# Patient Record
Sex: Male | Born: 1970
Health system: Southern US, Community
[De-identification: ages and names within clinical notes are randomized; demographics above are authoritative.]

## PROBLEM LIST (undated history)

## (undated) DIAGNOSIS — M1990 Unspecified osteoarthritis, unspecified site: Secondary | ICD-10-CM

## (undated) DIAGNOSIS — C6290 Malignant neoplasm of unspecified testis, unspecified whether descended or undescended: Secondary | ICD-10-CM

## (undated) DIAGNOSIS — K219 Gastro-esophageal reflux disease without esophagitis: Secondary | ICD-10-CM

## (undated) DIAGNOSIS — R55 Syncope and collapse: Secondary | ICD-10-CM

## (undated) DIAGNOSIS — F909 Attention-deficit hyperactivity disorder, unspecified type: Secondary | ICD-10-CM

## (undated) DIAGNOSIS — R011 Cardiac murmur, unspecified: Secondary | ICD-10-CM

## (undated) DIAGNOSIS — I480 Paroxysmal atrial fibrillation: Secondary | ICD-10-CM

## (undated) DIAGNOSIS — Z95810 Presence of automatic (implantable) cardiac defibrillator: Secondary | ICD-10-CM

## (undated) DIAGNOSIS — I428 Other cardiomyopathies: Secondary | ICD-10-CM

## (undated) DIAGNOSIS — I472 Ventricular tachycardia, unspecified: Secondary | ICD-10-CM

## (undated) DIAGNOSIS — I5020 Unspecified systolic (congestive) heart failure: Secondary | ICD-10-CM

## (undated) DIAGNOSIS — F1010 Alcohol abuse, uncomplicated: Secondary | ICD-10-CM

## (undated) DIAGNOSIS — J189 Pneumonia, unspecified organism: Secondary | ICD-10-CM

## (undated) HISTORY — DX: Other cardiomyopathies: I42.8

## (undated) HISTORY — PX: WRIST FRACTURE SURGERY: SHX121

## (undated) HISTORY — DX: Gastro-esophageal reflux disease without esophagitis: K21.9

## (undated) HISTORY — DX: Syncope and collapse: R55

## (undated) HISTORY — DX: Ventricular tachycardia, unspecified: I47.20

## (undated) HISTORY — DX: Ventricular tachycardia: I47.2

## (undated) HISTORY — DX: Alcohol abuse, uncomplicated: F10.10

## (undated) HISTORY — DX: Unspecified osteoarthritis, unspecified site: M19.90

## (undated) HISTORY — DX: Malignant neoplasm of unspecified testis, unspecified whether descended or undescended: C62.90

## (undated) HISTORY — DX: Attention-deficit hyperactivity disorder, unspecified type: F90.9

## (undated) HISTORY — PX: FRACTURE SURGERY: SHX138

## (undated) HISTORY — DX: Unspecified systolic (congestive) heart failure: I50.20

---

## 1996-01-04 DIAGNOSIS — C6290 Malignant neoplasm of unspecified testis, unspecified whether descended or undescended: Secondary | ICD-10-CM

## 1996-01-04 HISTORY — PX: ORCHIECTOMY: SHX2116

## 1996-01-04 HISTORY — DX: Malignant neoplasm of unspecified testis, unspecified whether descended or undescended: C62.90

## 1997-05-02 ENCOUNTER — Ambulatory Visit (HOSPITAL_COMMUNITY): Admission: RE | Admit: 1997-05-02 | Discharge: 1997-05-02 | Payer: Self-pay | Admitting: Urology

## 1997-05-05 ENCOUNTER — Inpatient Hospital Stay (HOSPITAL_COMMUNITY): Admission: AD | Admit: 1997-05-05 | Discharge: 1997-05-10 | Payer: Self-pay | Admitting: Oncology

## 1997-05-20 ENCOUNTER — Inpatient Hospital Stay (HOSPITAL_COMMUNITY): Admission: EM | Admit: 1997-05-20 | Discharge: 1997-05-23 | Payer: Self-pay | Admitting: *Deleted

## 1997-05-28 ENCOUNTER — Inpatient Hospital Stay (HOSPITAL_COMMUNITY): Admission: EM | Admit: 1997-05-28 | Discharge: 1997-06-02 | Payer: Self-pay | Admitting: Oncology

## 1997-06-09 ENCOUNTER — Ambulatory Visit: Admission: RE | Admit: 1997-06-09 | Discharge: 1997-06-09 | Payer: Self-pay | Admitting: Oncology

## 1997-06-18 ENCOUNTER — Inpatient Hospital Stay (HOSPITAL_COMMUNITY): Admission: AD | Admit: 1997-06-18 | Discharge: 1997-06-23 | Payer: Self-pay | Admitting: Oncology

## 1997-07-09 ENCOUNTER — Inpatient Hospital Stay (HOSPITAL_COMMUNITY): Admission: RE | Admit: 1997-07-09 | Discharge: 1997-07-14 | Payer: Self-pay | Admitting: Oncology

## 1997-07-25 ENCOUNTER — Encounter: Admission: RE | Admit: 1997-07-25 | Discharge: 1997-10-23 | Payer: Self-pay | Admitting: Oncology

## 1998-10-31 ENCOUNTER — Encounter: Payer: Self-pay | Admitting: Emergency Medicine

## 1998-10-31 ENCOUNTER — Emergency Department (HOSPITAL_COMMUNITY): Admission: EM | Admit: 1998-10-31 | Discharge: 1998-10-31 | Payer: Self-pay | Admitting: Emergency Medicine

## 2000-04-19 ENCOUNTER — Encounter: Admission: RE | Admit: 2000-04-19 | Discharge: 2000-04-19 | Payer: Self-pay | Admitting: Urology

## 2000-04-19 ENCOUNTER — Encounter: Payer: Self-pay | Admitting: Urology

## 2009-10-27 ENCOUNTER — Ambulatory Visit: Payer: Self-pay | Admitting: Emergency Medicine

## 2009-10-27 DIAGNOSIS — J45909 Unspecified asthma, uncomplicated: Secondary | ICD-10-CM | POA: Insufficient documentation

## 2009-10-27 DIAGNOSIS — Z8547 Personal history of malignant neoplasm of testis: Secondary | ICD-10-CM | POA: Insufficient documentation

## 2009-11-05 ENCOUNTER — Ambulatory Visit: Payer: Self-pay | Admitting: Emergency Medicine

## 2009-12-02 ENCOUNTER — Encounter: Payer: Self-pay | Admitting: Emergency Medicine

## 2010-02-02 NOTE — Assessment & Plan Note (Signed)
Summary: asthma   Visit Type:  Follow-up  CC:  Asthma follow-up with PFT...patient says his breathing has improved slightly...not using his rescue inhaler as much.  History of Present Illness: 40 yo never smoker, hx testicular CA. Dx with asthma about 12 years ago at the time his CA was treated. PFT were done at [facility] at that time, but none since.   Was well, rarely needed SABA, until about 3 weeks ago after he was exposed to a fine dust. Since then he has had trouble w wheeze and chest tightness and SOB. Has awakened from sleep SOB. Has been using SABA more, sometimes 2 -3x a day, certainly every day. Has some signs consistent w OSA, periodic GERD but not often, does have allergies that may have coincided with some of his pulm symptoms. Never treated w Pred for exac.    ROV 11/05/09 -- f/u dyspnea, hx of asthma. Has been doing better, SABA use has decreased some but still using once daily. Started loratadine on his own 3 days ago, occas GERD symptoms.   Current Medications (verified): 1)  Adderall Xr 30 Mg Xr24h-Cap (Amphetamine-Dextroamphetamine) .Marland Kitchen.. 1 By Mouth Daily 2)  Proair Hfa 108 (90 Base) Mcg/act Aers (Albuterol Sulfate) .... 2 Puffs Every 4-6 Hours As Needed 3)  Glucosamine 1500 Complex  Caps (Glucosamine-Chondroit-Vit C-Mn) .Marland Kitchen.. 1 By Mouth Daily  Allergies (verified): 1)  ! * Ivp Dyes  Vital Signs:  Patient profile:   40 year old male Height:      77 inches (195.58 cm) Weight:      211 pounds (95.91 kg) BMI:     25.11 O2 Sat:      100 % on Room air Temp:     98.7 degrees F (37.06 degrees C) oral Pulse rate:   102 / minute BP sitting:   120 / 74  (right arm) Cuff size:   regular  Vitals Entered By: Michel Bickers CMA (November 05, 2009 1:56 PM)  O2 Sat at Rest %:  100 O2 Flow:  Room air CC: Asthma follow-up with PFT...patient says his breathing has improved slightly...not using his rescue inhaler as much Comments Medications reviewed with patient Michel Bickers CMA   November 05, 2009 1:56 PM   Physical Exam  General:  normal appearance and healthy appearing.   Head:  normocephalic and atraumatic Eyes:  conjunctiva and sclera clear Nose:  no deformity, discharge, inflammation, or lesions Mouth:  no deformity or lesions Neck:  no stridor Lungs:  clear bilaterally to auscultation and percussion. No wheeze on a forced exp Heart:  regular rate and rhythm, S1, S2 without murmurs, rubs, gallops, or clicks Abdomen:  not examined Extremities:  no edema Neurologic:  non-focal Psych:  alert and cooperative; normal mood and affect; normal attention span and concentration   Pulmonary Function Test Date: 11/05/2009 Height (in.): 77 Gender: Male  Pre-Spirometry FVC    Value: 6.01 L/min   Pred: 6.15 L/min     % Pred: 98 % FEV1    Value: 4.06 L     Pred: 4.54 L     % Pred: 89 % FEV1/FVC  Value: 68 %     Pred: 73 %    FEF 25-75  Value: 2.49 L/min   Pred: 4.33 L/min     % Pred: 57 %  Post-Spirometry FVC    Value: 6.07 L/min   Pred: 6.15 L/min     % Pred: 99 % FEV1    Value: 4.08 L  Pred: 4.54 L     % Pred: 90 % FEV1/FVC  Value: 67 %     Pred: 73 %    FEF 25-75  Value: 2.52 L/min   Pred: 4.33 L/min     % Pred: 58 %  Lung Volumes TLC    Value: 7.82 L   % Pred: 91 % RV    Value: 1.80 L   % Pred: 72 % DLCO    Value: 44.8 %   % Pred: 137 % DLCO/VA  Value: 5.75 %   % Pred: 136 %  Comments: Mild AFL. No BD response, normal volumes, high DLCO. RSB  Impression & Recommendations:  Problem # 1:  ASTHMA (ICD-493.90) Mild by PFT with a fixed component. had been intermottant in the past, ? transitioning into persistent. Would like to try controlling the exacerbating factors - GERD and allergies to see if the SABA use goes back down. If not then will start QVAR  Other Orders: Est. Patient Level IV (16109)  Patient Instructions: 1)  Continue your loratadine once daily  2)  If your are still using your inhaler more than 1x a week after a month, then please  start omeprazole 20mg  by mouth once daily (Prilosec) 3)  Call our office in 2 months to report your inhaler use at that time. We may decide to start another inhaled medication depending on your status 4)  Follow with Dr Delton Coombes in 6 months

## 2010-02-02 NOTE — Miscellaneous (Signed)
Summary: Orders Update pft charges  Clinical Lists Changes  Orders: Added new Service order of Carbon Monoxide diffusing w/capacity (94720) - Signed Added new Service order of Lung Volumes (94240) - Signed Added new Service order of Spirometry (Pre & Post) (94060) - Signed 

## 2010-02-02 NOTE — Assessment & Plan Note (Signed)
Summary: asthma   Visit Type:  Initial Consult  CC:  Pulmonary consult....  History of Present Illness: 40 yo never smoker, hx testicular CA. Dx with asthma about 12 years ago at the time his CA was treated. PFT were done at [facility] at that time, but none since.   Was well, rarely needed SABA, until about 3 weeks ago after he was exposed to a fine dust. Since then he has had trouble w wheeze and chest tightness and SOB. Has awakened from sleep SOB. Has been using SABA more, sometimes 2 -3x a day, certainly every day. Has some signs consistent w OSA, periodic GERD but not often, does have allergies that may have coincided with some of his pulm symptoms. Never treated w Pred for exac.    Preventive Screening-Counseling & Management  Alcohol-Tobacco     Alcohol drinks/day: 3     Alcohol type: beer     Smoking Status: never  Current Medications (verified): 1)  Adderall Xr 30 Mg Xr24h-Cap (Amphetamine-Dextroamphetamine) .Marland Kitchen.. 1 By Mouth Daily 2)  Proair Hfa 108 (90 Base) Mcg/act Aers (Albuterol Sulfate) .... 2 Puffs Every 4-6 Hours As Needed 3)  Glucosamine 1500 Complex  Caps (Glucosamine-Chondroit-Vit C-Mn) .Marland Kitchen.. 1 By Mouth Daily  Allergies (verified): 1)  ! * Ivp Dyes  Past History:  Past Medical History: Current Problems:  TESTICULAR CANCER (ICD-186.9) ASTHMA (ICD-493.90)  Family History: Pancreatic cancer---MGF Heart disease---mother  Social History: Patient never smoked.  Married Art therapist Smoking Status:  never Alcohol drinks/day:  3  Review of Systems       The patient complains of shortness of breath with activity, shortness of breath at rest, productive cough, non-productive cough, acid heartburn, and nasal congestion/difficulty breathing through nose.  The patient denies coughing up blood, chest pain, irregular heartbeats, indigestion, loss of appetite, weight change, abdominal pain, difficulty swallowing, sore throat, tooth/dental problems,  headaches, sneezing, itching, ear ache, anxiety, depression, hand/feet swelling, joint stiffness or pain, rash, change in color of mucus, and fever.    Vital Signs:  Patient profile:   40 year old male Height:      77 inches (195.58 cm) Weight:      210 pounds (95.45 kg) BMI:     24.99 O2 Sat:      97 % on Room air Temp:     98.2 degrees F (36.78 degrees C) oral Pulse rate:   90 / minute BP sitting:   164 / 106  (right arm) Cuff size:   regular  Vitals Entered By: Michel Bickers CMA (October 27, 2009 3:23 PM)  O2 Sat at Rest %:  97 O2 Flow:  Room air CC: Pulmonary consult... Is Patient Diabetic? No Comments Medications reviewed with patient Daytime phone verified. Michel Bickers CMA  October 27, 2009 3:24 PM   Physical Exam  General:  normal appearance and healthy appearing.   Head:  normocephalic and atraumatic Eyes:  conjunctiva and sclera clear Nose:  no deformity, discharge, inflammation, or lesions Mouth:  no deformity or lesions Neck:  no stridor Lungs:  clear bilaterally to auscultation and percussion. No wheeze on a forced exp Heart:  regular rate and rhythm, S1, S2 without murmurs, rubs, gallops, or clicks Abdomen:  not examined Extremities:  no edema Neurologic:  non-focal Psych:  alert and cooperative; normal mood and affect; normal attention span and concentration   Impression & Recommendations:  Problem # 1:  ASTHMA (ICD-493.90) Suspect this is asthma, possibly being driven by mild GERD and allergies.  May also be affected by suspected OSA.  - full PFT - as needed SABA for now - address GERD or allergies if we believe that these are influencing   Problem # 2:  ? of SLEEP APNEA (ICD-780.57)  - consider PSG in the future  Orders: Consultation Level IV (16109)  Medications Added to Medication List This Visit: 1)  Adderall Xr 30 Mg Xr24h-cap (Amphetamine-dextroamphetamine) .Marland Kitchen.. 1 by mouth daily 2)  Proair Hfa 108 (90 Base) Mcg/act Aers (Albuterol sulfate)  .... 2 puffs every 4-6 hours as needed 3)  Glucosamine 1500 Complex Caps (Glucosamine-chondroit-vit c-mn) .Marland Kitchen.. 1 by mouth daily  Patient Instructions: 1)  We will arrange for pulmonary function testing as soon as possible.  2)  Follow up with Dr Delton Coombes when your PFts are done to review 3)  Use Ventolin 2 puffs up to every 4 hours if needed for shortness of breath.   Appended Document: Orders Update    Clinical Lists Changes  Orders: Added new Referral order of Pulmonary Referral (Pulmonary) - Signed

## 2010-02-05 ENCOUNTER — Emergency Department (HOSPITAL_COMMUNITY): Payer: 59

## 2010-02-05 ENCOUNTER — Encounter: Payer: Self-pay | Admitting: Internal Medicine

## 2010-02-05 ENCOUNTER — Observation Stay (HOSPITAL_COMMUNITY)
Admission: EM | Admit: 2010-02-05 | Discharge: 2010-02-06 | Disposition: A | Discharge status: Home / Self Care | Payer: 59 | Attending: Internal Medicine | Admitting: Internal Medicine

## 2010-02-05 ENCOUNTER — Encounter (HOSPITAL_COMMUNITY): Payer: Self-pay | Admitting: Radiology

## 2010-02-05 DIAGNOSIS — F1011 Alcohol abuse, in remission: Secondary | ICD-10-CM | POA: Insufficient documentation

## 2010-02-05 DIAGNOSIS — M47812 Spondylosis without myelopathy or radiculopathy, cervical region: Secondary | ICD-10-CM | POA: Insufficient documentation

## 2010-02-05 DIAGNOSIS — Z7982 Long term (current) use of aspirin: Secondary | ICD-10-CM | POA: Insufficient documentation

## 2010-02-05 DIAGNOSIS — Z8547 Personal history of malignant neoplasm of testis: Secondary | ICD-10-CM | POA: Insufficient documentation

## 2010-02-05 DIAGNOSIS — J329 Chronic sinusitis, unspecified: Secondary | ICD-10-CM | POA: Insufficient documentation

## 2010-02-05 DIAGNOSIS — F909 Attention-deficit hyperactivity disorder, unspecified type: Secondary | ICD-10-CM | POA: Insufficient documentation

## 2010-02-05 DIAGNOSIS — R55 Syncope and collapse: Principal | ICD-10-CM | POA: Insufficient documentation

## 2010-02-05 DIAGNOSIS — I509 Heart failure, unspecified: Secondary | ICD-10-CM | POA: Insufficient documentation

## 2010-02-05 DIAGNOSIS — I379 Nonrheumatic pulmonary valve disorder, unspecified: Secondary | ICD-10-CM | POA: Insufficient documentation

## 2010-02-05 DIAGNOSIS — Z79899 Other long term (current) drug therapy: Secondary | ICD-10-CM | POA: Insufficient documentation

## 2010-02-05 DIAGNOSIS — R220 Localized swelling, mass and lump, head: Secondary | ICD-10-CM | POA: Insufficient documentation

## 2010-02-05 DIAGNOSIS — I5020 Unspecified systolic (congestive) heart failure: Secondary | ICD-10-CM | POA: Insufficient documentation

## 2010-02-05 DIAGNOSIS — R9431 Abnormal electrocardiogram [ECG] [EKG]: Secondary | ICD-10-CM | POA: Insufficient documentation

## 2010-02-05 LAB — URINALYSIS, ROUTINE W REFLEX MICROSCOPIC
Bilirubin Urine: NEGATIVE
Hgb urine dipstick: NEGATIVE
Ketones, ur: NEGATIVE mg/dL
Nitrite: NEGATIVE
Protein, ur: NEGATIVE mg/dL
Specific Gravity, Urine: 1.01 (ref 1.005–1.030)
Urine Glucose, Fasting: NEGATIVE mg/dL
Urobilinogen, UA: 0.2 mg/dL (ref 0.0–1.0)
pH: 7 (ref 5.0–8.0)

## 2010-02-05 LAB — TROPONIN I: Troponin I: 0.01 ng/mL (ref 0.00–0.06)

## 2010-02-05 LAB — D-DIMER, QUANTITATIVE: D-Dimer, Quant: 0.22 ug/mL-FEU (ref 0.00–0.48)

## 2010-02-05 LAB — DIFFERENTIAL
Basophils Absolute: 0 10*3/uL (ref 0.0–0.1)
Basophils Relative: 0 % (ref 0–1)
Eosinophils Absolute: 0.2 10*3/uL (ref 0.0–0.7)
Eosinophils Relative: 3 % (ref 0–5)
Lymphocytes Relative: 12 % (ref 12–46)
Lymphs Abs: 1.1 10*3/uL (ref 0.7–4.0)
Monocytes Absolute: 0.5 10*3/uL (ref 0.1–1.0)
Monocytes Relative: 6 % (ref 3–12)
Neutro Abs: 6.6 10*3/uL (ref 1.7–7.7)
Neutrophils Relative %: 79 % — ABNORMAL HIGH (ref 43–77)

## 2010-02-05 LAB — CBC
HCT: 45.5 % (ref 39.0–52.0)
Hemoglobin: 16.1 g/dL (ref 13.0–17.0)
MCH: 33.1 pg (ref 26.0–34.0)
MCHC: 35.4 g/dL (ref 30.0–36.0)
MCV: 93.4 fL (ref 78.0–100.0)
Platelets: 182 10*3/uL (ref 150–400)
RBC: 4.87 MIL/uL (ref 4.22–5.81)
RDW: 12.5 % (ref 11.5–15.5)
WBC: 8.5 10*3/uL (ref 4.0–10.5)

## 2010-02-05 LAB — PHOSPHORUS: Phosphorus: 3.1 mg/dL (ref 2.3–4.6)

## 2010-02-05 LAB — BASIC METABOLIC PANEL
BUN: 12 mg/dL (ref 6–23)
CO2: 26 mEq/L (ref 19–32)
Calcium: 9.3 mg/dL (ref 8.4–10.5)
Chloride: 101 mEq/L (ref 96–112)
Creatinine, Ser: 1.16 mg/dL (ref 0.4–1.5)
GFR calc Af Amer: >60 mL/min (ref >60)
GFR calc non Af Amer: >60 mL/min (ref >60)
Glucose, Bld: 100 mg/dL — ABNORMAL HIGH (ref 70–99)
Potassium: 3.7 mEq/L (ref 3.5–5.1)
Sodium: 137 mEq/L (ref 135–145)

## 2010-02-05 LAB — CK TOTAL AND CKMB (NOT AT ARMC)
CK, MB: 1.9 ng/mL (ref 0.3–4.0)
Relative Index: 1.3 (ref 0.0–2.5)
Total CK: 145 U/L (ref 7–232)

## 2010-02-05 LAB — POCT CARDIAC MARKERS
CKMB, poc: 1.7 ng/mL (ref 1.0–8.0)
Myoglobin, poc: 94.3 ng/mL (ref 12–200)
Troponin i, poc: <0.05 ng/mL (ref 0.00–0.09)

## 2010-02-05 LAB — ETHANOL: Alcohol, Ethyl (B): <5 mg/dL (ref 0–10)

## 2010-02-05 LAB — TSH: TSH: 2.486 u[IU]/mL (ref 0.350–4.500)

## 2010-02-05 LAB — HEMOGLOBIN A1C
Hgb A1c MFr Bld: 5.4 % (ref <5.7)
Mean Plasma Glucose: 108 mg/dL (ref <117)

## 2010-02-05 LAB — RAPID URINE DRUG SCREEN, HOSP PERFORMED
Amphetamines: POSITIVE — AB
Barbiturates: NOT DETECTED
Benzodiazepines: NOT DETECTED
Cocaine: NOT DETECTED
Opiates: NOT DETECTED
Tetrahydrocannabinol: NOT DETECTED

## 2010-02-05 LAB — MAGNESIUM: Magnesium: 2.2 mg/dL (ref 1.5–2.5)

## 2010-02-06 DIAGNOSIS — R55 Syncope and collapse: Secondary | ICD-10-CM

## 2010-02-06 LAB — LIPID PANEL
Cholesterol: 167 mg/dL (ref 0–200)
HDL: 59 mg/dL (ref >39)
LDL Cholesterol: 89 mg/dL (ref 0–99)
Total CHOL/HDL Ratio: 2.8 RATIO
Triglycerides: 96 mg/dL (ref <150)
VLDL: 19 mg/dL (ref 0–40)

## 2010-02-06 LAB — CBC
HCT: 44.3 % (ref 39.0–52.0)
Hemoglobin: 15.4 g/dL (ref 13.0–17.0)
MCH: 32.6 pg (ref 26.0–34.0)
MCHC: 34.8 g/dL (ref 30.0–36.0)
MCV: 93.7 fL (ref 78.0–100.0)
Platelets: 161 10*3/uL (ref 150–400)
RBC: 4.73 MIL/uL (ref 4.22–5.81)
RDW: 12.5 % (ref 11.5–15.5)
WBC: 7.4 10*3/uL (ref 4.0–10.5)

## 2010-02-06 LAB — CARDIAC PANEL(CRET KIN+CKTOT+MB+TROPI)
CK, MB: 1.6 ng/mL (ref 0.3–4.0)
CK, MB: 1.7 ng/mL (ref 0.3–4.0)
Relative Index: 1.2 (ref 0.0–2.5)
Relative Index: 1.4 (ref 0.0–2.5)
Total CK: 121 U/L (ref 7–232)
Total CK: 137 U/L (ref 7–232)
Troponin I: 0.01 ng/mL (ref 0.00–0.06)
Troponin I: 0.01 ng/mL (ref 0.00–0.06)

## 2010-02-08 ENCOUNTER — Encounter: Payer: Self-pay | Admitting: Internal Medicine

## 2010-02-08 ENCOUNTER — Inpatient Hospital Stay (HOSPITAL_COMMUNITY)
Admission: AD | Admit: 2010-02-08 | Discharge: 2010-02-11 | DRG: 225 | Disposition: A | Discharge status: Home Health | Payer: 59 | Source: Ambulatory Visit | Attending: Internal Medicine | Admitting: Internal Medicine

## 2010-02-08 ENCOUNTER — Encounter (INDEPENDENT_AMBULATORY_CARE_PROVIDER_SITE_OTHER): Payer: Commercial Managed Care - PPO | Admitting: Internal Medicine

## 2010-02-08 DIAGNOSIS — R55 Syncope and collapse: Secondary | ICD-10-CM | POA: Insufficient documentation

## 2010-02-08 DIAGNOSIS — F909 Attention-deficit hyperactivity disorder, unspecified type: Secondary | ICD-10-CM | POA: Diagnosis present

## 2010-02-08 DIAGNOSIS — I428 Other cardiomyopathies: Secondary | ICD-10-CM | POA: Diagnosis present

## 2010-02-08 DIAGNOSIS — Z8547 Personal history of malignant neoplasm of testis: Secondary | ICD-10-CM

## 2010-02-08 DIAGNOSIS — I119 Hypertensive heart disease without heart failure: Secondary | ICD-10-CM | POA: Insufficient documentation

## 2010-02-08 DIAGNOSIS — J45909 Unspecified asthma, uncomplicated: Secondary | ICD-10-CM | POA: Diagnosis present

## 2010-02-08 DIAGNOSIS — I10 Essential (primary) hypertension: Secondary | ICD-10-CM | POA: Diagnosis present

## 2010-02-08 DIAGNOSIS — I429 Cardiomyopathy, unspecified: Secondary | ICD-10-CM | POA: Insufficient documentation

## 2010-02-08 DIAGNOSIS — R0989 Other specified symptoms and signs involving the circulatory and respiratory systems: Secondary | ICD-10-CM

## 2010-02-08 LAB — CBC
HCT: 44.4 % (ref 39.0–52.0)
Hemoglobin: 15.5 g/dL (ref 13.0–17.0)
MCH: 32.7 pg (ref 26.0–34.0)
MCHC: 34.9 g/dL (ref 30.0–36.0)
MCV: 93.7 fL (ref 78.0–100.0)
Platelets: 149 10*3/uL — ABNORMAL LOW (ref 150–400)
RBC: 4.74 MIL/uL (ref 4.22–5.81)
RDW: 12.6 % (ref 11.5–15.5)
WBC: 7.1 10*3/uL (ref 4.0–10.5)

## 2010-02-08 LAB — BASIC METABOLIC PANEL
BUN: 14 mg/dL (ref 6–23)
CO2: 25 mEq/L (ref 19–32)
Calcium: 9.2 mg/dL (ref 8.4–10.5)
Chloride: 103 mEq/L (ref 96–112)
Creatinine, Ser: 1.22 mg/dL (ref 0.4–1.5)
GFR calc Af Amer: >60 mL/min (ref >60)
GFR calc non Af Amer: >60 mL/min (ref >60)
Glucose, Bld: 104 mg/dL — ABNORMAL HIGH (ref 70–99)
Potassium: 3.5 mEq/L (ref 3.5–5.1)
Sodium: 139 mEq/L (ref 135–145)

## 2010-02-08 LAB — PROTIME-INR
INR: 0.93 (ref 0.00–1.49)
Prothrombin Time: 12.7 seconds (ref 11.6–15.2)

## 2010-02-09 DIAGNOSIS — I428 Other cardiomyopathies: Secondary | ICD-10-CM

## 2010-02-10 ENCOUNTER — Ambulatory Visit: Payer: No Typology Code available for payment source | Admitting: Internal Medicine

## 2010-02-10 ENCOUNTER — Inpatient Hospital Stay (HOSPITAL_COMMUNITY): Payer: 59

## 2010-02-10 MED ORDER — GADOPENTETATE DIMEGLUMINE 469.01 MG/ML IV SOLN: 45.0000 mL | Freq: Once | INTRAVENOUS | Status: DC

## 2010-02-10 MED ORDER — GADOPENTETATE DIMEGLUMINE 469.01 MG/ML IV SOLN
45.0000 mL | Freq: Once | INTRAVENOUS | Status: AC
  Administered 2010-02-10: 13:00:00 45 mL via INTRAVENOUS

## 2010-02-11 ENCOUNTER — Inpatient Hospital Stay (HOSPITAL_COMMUNITY): Payer: 59
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
## 2010-02-15 ENCOUNTER — Telehealth: Payer: Self-pay | Admitting: Cardiology
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
## 2010-02-17 ENCOUNTER — Encounter: Payer: Self-pay | Admitting: Internal Medicine

## 2010-02-17 ENCOUNTER — Encounter (INDEPENDENT_AMBULATORY_CARE_PROVIDER_SITE_OTHER): Payer: 59

## 2010-02-17 DIAGNOSIS — I428 Other cardiomyopathies: Secondary | ICD-10-CM

## 2010-02-17 NOTE — Procedures (Signed)
  NAMEJEQUAN, SHAHIN NO.:  [id number]  MEDICAL RECORD NO.:  [id number]           PATIENT TYPE:  I  LOCATION:  6524                         FACILITY:  [facility]  PHYSICIAN:  Mairany Bruno M. [place], M.D.  DATE OF BIRTH:  02/04/1970  DATE OF PROCEDURE:  02/09/2010 DATE OF DISCHARGE:                           CARDIAC CATHETERIZATION   INDICATIONS FOR PROCEDURE:  A 40 year old white male with recent episode of syncope.  Echocardiogram demonstrates severe cardiomyopathy.  PROCEDURE:  Left heart catheterization, coronary and left ventricular angiography.  ACCESS:  Via the right radial artery using standard Seldinger technique.  EQUIPMENT:  5-French 4-cm right Judkins catheter, 5-French 3.5-cm left Judkins catheter, 5-French pigtail catheter, and 5-French arterial sheath.  MEDICATIONS:  Local anesthesia 1% Xylocaine, Versed 2 mg IV, fentanyl 25 mcg IV, verapamil 3 mg intra-arterial, heparin 4000 units IV.  CONTRAST:  90 mL of Omnipaque.  HEMODYNAMIC DATA:  Aortic pressure is 127/88 with a mean of 105.  Left ventricle pressure is 129 with an EDP of 10 mmHg.  ANGIOGRAPHIC DATA:  The left coronary artery arises and distributes in a codominant fashion.  The left main coronary artery is normal.  The left anterior descending artery is normal.  The left circumflex coronary is very large and is normal.  The right coronary artery gives rise to the PDA.  It is normal.  The left ventricular angiography performed in the RAO view demonstrates normal left ventricular size with global systolic dysfunction and ejection fraction estimated at 30-35%.  FINAL INTERPRETATION: 1. Normal coronary anatomy. 2. Severe left ventricular dysfunction.          ______________________________ Reyan Helle M. [place], M.D.     PMJ/MEDQ  D:  02/09/2010  T:  02/10/2010  Job:  [id number]  cc:   Duke Salvia, MD, [facility] Luis Abed, MD, [facility]  Electronically Signed by Adan Baehr [place] M.D. on  02/17/2010 11:17:21 AM
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
## 2010-02-23 ENCOUNTER — Encounter: Payer: Self-pay | Admitting: Cardiology

## 2010-02-24 ENCOUNTER — Encounter: Payer: Self-pay | Admitting: Cardiology

## 2010-02-24 NOTE — Miscellaneous (Signed)
Summary: Device preload  Clinical Lists Changes  Observations: Added new observation of ICD INDICATN: NICM (02/17/2010 7:23) Added new observation of ICDLEADSTAT1: active (02/17/2010 7:23) Added new observation of ICDLEADSER1: 161096  (02/17/2010 7:23) Added new observation of ICDLEADMOD1: 0185  (02/17/2010 7:23) Added new observation of ICDLEADLOC1: RV  (02/17/2010 7:23) Added new observation of ICD IMP MD: Lewayne Bunting, MD  (02/17/2010 7:23) Added new observation of ICDLEADDOI1: 02/10/2010  (02/17/2010 7:23) Added new observation of ICD IMPL DTE: 02/10/2010  (02/17/2010 7:23) Added new observation of ICD SERL#: [id number] H  (02/17/2010 7:23) Added new observation of ICD MODL#: [id number]  (02/17/2010 7:23) Added new observation of ICDMANUFACTR: Medtronic  (02/17/2010 7:23) Added new observation of ICD MD: Lewayne Bunting, MD  (02/17/2010 7:23)       ICD Specifications Following MD:  Lewayne Bunting, MD     ICD Vendor:  Medtronic     ICD Model Number:  D314VRG     ICD Serial Number:  [id number] ICD DOI:  02/10/2010     ICD Implanting MD:  Lewayne Bunting, MD  Lead 1:    Location: RV     DOI: 02/10/2010     Model #: [id number]     Serial #: [id number]     Status: active  Indications::  NICM

## 2010-02-24 NOTE — Progress Notes (Addendum)
Summary: pt having side effect from med    to discuss with JK  Phone Note Call from Patient   Caller: Patient [phone] Reason for Call: Talk to Doctor Summary of Call: pt calling re side effect of med Initial call taken by: Glynda Jaeger,  February 15, 2010 10:42 AM  Follow-up for Phone Call        Left message to call back Meredith Staggers, RN  February 15, 2010 11:30 AM   pt is having intermintent numbness in hands and feet, feels it could be from coreg, will discuss w/Dr Myrtis Ser and call pt back Meredith Staggers, RN  February 15, 2010 5:13 PM   pt's wife calling back re question on med-and had another syncopal episode today-pls call -pls call [phone] stacy Glynda Jaeger  February 16, 2010 3:35 PM   Additional Follow-up for Phone Call Additional follow up Details #1::        spoke w/pts wife earlier she reports pt was standing in hallway earlier and began very dizzy had to lay down, he did not passout completely but felt like he did in the car before going to hospital, she reports he was very diaphoretic, discussed w/Dr Bensimhon bring pt in for ICD interrogation tom., appt sch for 2:30 pts wife is aware, she will begin checking pts BP Meredith Staggers, RN  February 16, 2010 5:52 PM     Additional Follow-up for Phone Call Additional follow up Details #2::    pt had device interogatted and Dr Ladona Ridgel spoke w/pt and his wife and d/c ramipril due to pt starting to have a cough, see device note for details Meredith Staggers, RN  February 17, 2010 4:07 PM
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
## 2010-03-01 ENCOUNTER — Ambulatory Visit: Payer: Commercial Managed Care - PPO

## 2010-03-02 NOTE — Procedures (Signed)
Summary: Cardiology Device Clinic   Current Medications (verified): 1)  Adderall Xr 30 Mg Xr24h-Cap (Amphetamine-Dextroamphetamine) .Marland Kitchen.. 1 By Mouth Daily 2)  Proair Hfa 108 (90 Base) Mcg/act Aers (Albuterol Sulfate) .... 2 Puffs Every 4-6 Hours As Needed 3)  Glucosamine 1500 Complex  Caps (Glucosamine-Chondroit-Vit C-Mn) .Marland Kitchen.. 1 By Mouth Daily 4)  Ramipril 2.5 Mg Caps (Ramipril) .... Take One Capsule Once Daily 5)  Aspirin 81 Mg Tabs (Aspirin) .... Take One Tablet Once Daily 6)  Carvedilol 6.25 Mg Tabs (Carvedilol) .... One By Mouth Two Times A Day  Allergies (verified): 1)  ! * Ivp Dyes   ICD Specifications Following MD:  Lewayne Bunting, MD     ICD Vendor:  Medtronic     ICD Model Number:  D314VRG     ICD Serial Number:  [id number] ICD DOI:  02/10/2010     ICD Implanting MD:  Lewayne Bunting, MD  Lead 1:    Location: RV     DOI: 02/10/2010     Model #: 4098     Serial #: [id number]     Status: active  Indications::  NICM  Tech Comments:  See Arita Miss Art

## 2010-03-02 NOTE — Miscellaneous (Signed)
  Clinical Lists Changes  Observations: Added new observation of CARDIO HPI: I called patient to let hime know that I have asked Dr.Bensimhon to be his primary cardiologist. He understands.  He his stopped all alcohol as I had advised him. He has requested xanax to help for a short period. We will arrange for xanax for a short period. (02/24/2010 15:53) Added new observation of VISIT TYPE: Telephone Call (02/24/2010 15:53)      Visit Type:  Telephone Call   History of Present Illness: I called patient to let hime know that I have asked Dr.Bensimhon to be his primary cardiologist. He understands.  He his stopped all alcohol as I had advised him. He has requested xanax to help for a short period. We will arrange for xanax for a short period.  Appended Document:  [name],   Please arrange an office visit for patient to see Dan in 2 week range. Please also call in xanax 0.25mg  , one as needed for current stress.  Give 20 tabs with one refill.  Appended Document:  He uses [facility].  Appended Document:  rx telephoned into pharmacy, appt sch w/Dr Bensimhon for 3/5 at 4:30, pts wife is aware    Clinical Lists Changes  Medications: Added new medication of ALPRAZOLAM 0.25 MG TABS (ALPRAZOLAM) once daily as needed - Signed Rx of ALPRAZOLAM 0.25 MG TABS (ALPRAZOLAM) once daily as needed;  #20 x 1;  Signed;  Entered by: Meredith Staggers, RN;  Authorized by: Talitha Givens, MD, [facility];  Method used: Printed then faxed to [facility] [address], [address] Shipping/mailing, [place], [place]  [address], Ph: [phone], Fax: [phone]    Prescriptions: ALPRAZOLAM 0.25 MG TABS (ALPRAZOLAM) once daily as needed  #20 x 1   Entered by:   Meredith Staggers, RN   Authorized by:   Talitha Givens, MD, [facility]   Signed by:   Meredith Staggers, RN on 02/24/2010   Method used:   Printed then faxed to ...       [facility]* (retail)  [address]       [address] Shipping/mailing       [place], [place]  [address]       Ph: [phone]       Fax: [phone]   RxID:   [id number]

## 2010-03-02 NOTE — Cardiovascular Report (Signed)
Summary: Office Visit   Office Visit   Imported By: Roderic Ovens 02/24/2010 15:51:34  _____________________________________________________________________  External Attachment:    Type:   Image     Comment:   External Document

## 2010-03-08 ENCOUNTER — Encounter (INDEPENDENT_AMBULATORY_CARE_PROVIDER_SITE_OTHER): Payer: 59 | Admitting: Internal Medicine

## 2010-03-08 ENCOUNTER — Encounter: Payer: Self-pay | Admitting: Internal Medicine

## 2010-03-08 DIAGNOSIS — I428 Other cardiomyopathies: Secondary | ICD-10-CM

## 2010-03-09 ENCOUNTER — Encounter: Payer: Self-pay | Admitting: Internal Medicine

## 2010-03-10 ENCOUNTER — Other Ambulatory Visit: Payer: 59

## 2010-03-11 ENCOUNTER — Encounter (INDEPENDENT_AMBULATORY_CARE_PROVIDER_SITE_OTHER): Payer: Self-pay | Admitting: *Deleted

## 2010-03-11 NOTE — Letter (Signed)
Summary: [name]   Imported By: Marylou Mccoy 03/05/2010 11:24:47  _____________________________________________________________________  External Attachment:    Type:   Image     Comment:   External Document
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
## 2010-03-16 NOTE — Letter (Signed)
Summary: Device-Delinquent Check  [facility], Main Office  [address]   [place], [place] [address]   Phone: [phone]  Fax: [phone]     March 11, 2010 MRN: [id number]   [name] [address] [place], [place]  [address]   Dear [name],  According to our records, you have not had your implanted device checked in the recommended period of time.  We are unable to determine appropriate device function without checking your device on a regular basis.  Please call our office to schedule an appointment as soon as possible.  If you are having your device checked by another physician, please call us so that we may update our records.  Thank you,  Letta Moynahan, EMT  March 11, 2010 2:02 PM  [facility]

## 2010-03-17 ENCOUNTER — Encounter (HOSPITAL_COMMUNITY): Payer: 59

## 2010-03-22 ENCOUNTER — Encounter: Payer: Commercial Managed Care - PPO | Admitting: Internal Medicine

## 2010-03-23 NOTE — Procedures (Signed)
Summary: had presyncopal episode yest, per bensimhon have interogatted...   Allergies: 1)  ! * Ivp Dyes   ICD Specifications Following MD:  Lewayne Bunting, MD     ICD Vendor:  [employer]     ICD Model Number:  D314VRG     ICD Serial Number:  [id number] ICD DOI:  02/10/2010     ICD Implanting MD:  Lewayne Bunting, MD  Lead 1:    Location: RV     DOI: 02/10/2010     Model #: [id number]     Serial #: [id number]     Status: active  Indications::  NICM  Tech Comments:  see paceart report.

## 2010-04-23 ENCOUNTER — Telehealth: Payer: Self-pay | Admitting: Emergency Medicine

## 2010-04-23 MED ORDER — ALBUTEROL SULFATE HFA 108 (90 BASE) MCG/ACT IN AERS: INHALATION_SPRAY | RESPIRATORY_TRACT | Status: DC

## 2010-04-23 NOTE — Telephone Encounter (Signed)
Left detailed message rx was sent to pharmacy

## 2010-04-27 ENCOUNTER — Encounter: Payer: 59 | Admitting: Internal Medicine

## 2010-04-29 ENCOUNTER — Encounter: Payer: Self-pay | Admitting: Internal Medicine

## 2010-07-09 ENCOUNTER — Encounter: Payer: Self-pay | Admitting: Cardiology

## 2010-08-17 ENCOUNTER — Telehealth: Payer: Self-pay | Admitting: Internal Medicine

## 2010-08-17 NOTE — Telephone Encounter (Signed)
Pt's wife calling husband having flu like symptoms--severe HA,back spasm --advised to go to urgent care or f/u with PCP--WIFE AGREES--NT

## 2010-08-17 NOTE — Telephone Encounter (Signed)
Per pt wife call, pt has been having flu like symptoms. First night pt was feeling lightheaded and goofy. The next morning pt had a splitting headache. Now pt is having back spasms, seeing stars, and feeling dizzy. Symptoms have been going on for the past three days. Pt is a little congested, with mostly head congestion. Pt wife is concerned. Please return pt call to advise/discuss.

## 2010-08-26 ENCOUNTER — Inpatient Hospital Stay (INDEPENDENT_AMBULATORY_CARE_PROVIDER_SITE_OTHER)
Admission: RE | Admit: 2010-08-26 | Discharge: 2010-08-26 | Disposition: A | Discharge status: Home / Self Care | Payer: 59 | Source: Ambulatory Visit | Attending: Family Medicine | Admitting: Family Medicine

## 2010-08-26 DIAGNOSIS — G542 Cervical root disorders, not elsewhere classified: Secondary | ICD-10-CM

## 2010-08-26 DIAGNOSIS — M542 Cervicalgia: Secondary | ICD-10-CM

## 2010-09-07 ENCOUNTER — Other Ambulatory Visit: Payer: Self-pay | Admitting: Emergency Medicine

## 2010-09-07 DIAGNOSIS — M5412 Radiculopathy, cervical region: Secondary | ICD-10-CM

## 2010-09-16 ENCOUNTER — Other Ambulatory Visit: Payer: 59

## 2010-09-16 ENCOUNTER — Ambulatory Visit
Admission: RE | Admit: 2010-09-16 | Discharge: 2010-09-16 | Disposition: A | Discharge status: Home / Self Care | Payer: 59 | Source: Ambulatory Visit | Attending: Emergency Medicine | Admitting: Emergency Medicine

## 2010-09-16 DIAGNOSIS — M5412 Radiculopathy, cervical region: Secondary | ICD-10-CM

## 2010-09-21 ENCOUNTER — Telehealth: Payer: Self-pay | Admitting: Emergency Medicine

## 2010-09-21 MED ORDER — ALBUTEROL SULFATE HFA 108 (90 BASE) MCG/ACT IN AERS: 2.0000 | INHALATION_SPRAY | Freq: Four times a day (QID) | RESPIRATORY_TRACT | Status: DC | PRN

## 2010-09-21 NOTE — Telephone Encounter (Signed)
Spoke with Lori-okay to change Rx's according to insurance coverage. Pt is aware of change and sent to [facility] as requested.

## 2010-10-11 ENCOUNTER — Ambulatory Visit: Payer: 59 | Admitting: Emergency Medicine

## 2010-11-15 ENCOUNTER — Ambulatory Visit: Payer: 59 | Admitting: Emergency Medicine

## 2011-01-22 ENCOUNTER — Ambulatory Visit (INDEPENDENT_AMBULATORY_CARE_PROVIDER_SITE_OTHER): Payer: 59

## 2011-01-22 DIAGNOSIS — M533 Sacrococcygeal disorders, not elsewhere classified: Secondary | ICD-10-CM

## 2011-01-26 ENCOUNTER — Telehealth: Payer: Self-pay | Admitting: Internal Medicine

## 2011-01-26 NOTE — Telephone Encounter (Signed)
New Msg: Pt wife calling wanting to schedule appt for pt to get clearance to exercise. Please return pt wife call to discuss if pt needs to see Dr. Gala Romney in [facility], or if pt care needs to transferred to another provider.

## 2011-01-26 NOTE — Telephone Encounter (Signed)
Spoke with pt's spouse and scheduled appt to see Dr. Graciela Husbands per pt request.

## 2011-02-21 ENCOUNTER — Other Ambulatory Visit: Payer: Self-pay | Admitting: Physician Assistant

## 2011-02-23 ENCOUNTER — Ambulatory Visit (INDEPENDENT_AMBULATORY_CARE_PROVIDER_SITE_OTHER): Payer: 59 | Admitting: Internal Medicine

## 2011-02-23 ENCOUNTER — Encounter: Payer: Self-pay | Admitting: Internal Medicine

## 2011-02-23 DIAGNOSIS — I509 Heart failure, unspecified: Secondary | ICD-10-CM | POA: Insufficient documentation

## 2011-02-23 DIAGNOSIS — R55 Syncope and collapse: Secondary | ICD-10-CM

## 2011-02-23 DIAGNOSIS — I472 Ventricular tachycardia: Secondary | ICD-10-CM | POA: Insufficient documentation

## 2011-02-23 DIAGNOSIS — I428 Other cardiomyopathies: Secondary | ICD-10-CM

## 2011-02-23 DIAGNOSIS — Z95810 Presence of automatic (implantable) cardiac defibrillator: Secondary | ICD-10-CM | POA: Insufficient documentation

## 2011-02-23 LAB — ICD DEVICE OBSERVATION
BATTERY VOLTAGE: 3.1653 V
BRDY-0002RV: 40 {beats}/min
CHARGE TIME: 8.678 s
DEV-0020ICD: NEGATIVE
FVT: 0
PACEART VT: 0
RV LEAD AMPLITUDE: 14.375 mv
RV LEAD IMPEDENCE ICD: 456 Ohm
RV LEAD THRESHOLD: 0.75 V
TOT-0001: 1
TOT-0002: 1
TOT-0006: 20120208000000
TZAT-0001FASTVT: 1
TZAT-0001SLOWVT: 1
TZAT-0002FASTVT: NEGATIVE
TZAT-0002SLOWVT: NEGATIVE
TZAT-0012FASTVT: 200 ms
TZAT-0012SLOWVT: 200 ms
TZAT-0018FASTVT: NEGATIVE
TZAT-0018SLOWVT: NEGATIVE
TZAT-0019FASTVT: 8 V
TZAT-0019SLOWVT: 8 V
TZAT-0020FASTVT: 1.5 ms
TZAT-0020SLOWVT: 1.5 ms
TZON-0003SLOWVT: 400 ms
TZON-0003VSLOWVT: 330 ms
TZON-0004SLOWVT: 16
TZON-0004VSLOWVT: 40
TZON-0005SLOWVT: 12
TZST-0001FASTVT: 2
TZST-0001FASTVT: 3
TZST-0001FASTVT: 4
TZST-0001FASTVT: 5
TZST-0001FASTVT: 6
TZST-0001SLOWVT: 2
TZST-0001SLOWVT: 3
TZST-0001SLOWVT: 4
TZST-0001SLOWVT: 5
TZST-0001SLOWVT: 6
TZST-0002FASTVT: NEGATIVE
TZST-0002FASTVT: NEGATIVE
TZST-0002FASTVT: NEGATIVE
TZST-0002FASTVT: NEGATIVE
TZST-0002FASTVT: NEGATIVE
TZST-0002SLOWVT: NEGATIVE
TZST-0002SLOWVT: NEGATIVE
TZST-0002SLOWVT: NEGATIVE
TZST-0002SLOWVT: NEGATIVE
TZST-0002SLOWVT: NEGATIVE
VENTRICULAR PACING ICD: 0.01 pct
VF: 4

## 2011-02-23 MED ORDER — CARVEDILOL 12.5 MG PO TABS: 12.5000 mg | ORAL_TABLET | Freq: Two times a day (BID) | ORAL | Status: DC

## 2011-02-23 MED ORDER — SPIRONOLACTONE 25 MG PO TABS: 12.5000 mg | ORAL_TABLET | Freq: Every day | ORAL | Status: DC

## 2011-02-23 NOTE — Assessment & Plan Note (Signed)
Presumed secondary to chemotherapy; we will repeat his echo and look at this.

## 2011-02-23 NOTE — Assessment & Plan Note (Signed)
Will increse coreg
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
## 2011-02-23 NOTE — Patient Instructions (Signed)
Your physician has recommended you make the following change in your medication:  1) Increase coreg (carvediolol) to 12.5 mg one tablet by mouth twice daily  Your physician has requested that you have an echocardiogram. Echocardiography is a painless test that uses sound waves to create images of your heart. It provides your doctor with information about the size and shape of your heart and how well your heart's chambers and valves are working. This procedure takes approximately one hour. There are no restrictions for this procedure.  You have been referred to : Dr. Lewayne Bunting for consideration of VT ablation.

## 2011-02-23 NOTE — Assessment & Plan Note (Signed)
No recurrent syncope but lightheadedness was treated VT. I've advised him against driving.

## 2011-02-23 NOTE — Assessment & Plan Note (Addendum)
Recurrent VT terminated by antitachycardia pacing. The rate hovers around his detection rates I reprogrammed his device to a 2 zone device and lower detection rates of 200 beats per minute to 182. Because of the sustained tachycardia palpitations I have created a monitoring zone at 150.  Given his youth, we discussed catheter ablation. He would like to pursue this. I will have him see Dr. Ladona Ridgel. With that being a nonischemic myopathy I have told him that the likelihood of success may be diminished.

## 2011-03-03 ENCOUNTER — Ambulatory Visit (HOSPITAL_COMMUNITY): Payer: 59 | Attending: Cardiology

## 2011-03-03 ENCOUNTER — Other Ambulatory Visit: Payer: Self-pay

## 2011-03-03 DIAGNOSIS — I428 Other cardiomyopathies: Secondary | ICD-10-CM | POA: Insufficient documentation

## 2011-03-03 DIAGNOSIS — Z9221 Personal history of antineoplastic chemotherapy: Secondary | ICD-10-CM | POA: Insufficient documentation

## 2011-03-03 DIAGNOSIS — R55 Syncope and collapse: Secondary | ICD-10-CM | POA: Insufficient documentation

## 2011-03-03 DIAGNOSIS — I509 Heart failure, unspecified: Secondary | ICD-10-CM

## 2011-03-03 DIAGNOSIS — Z8547 Personal history of malignant neoplasm of testis: Secondary | ICD-10-CM | POA: Insufficient documentation

## 2011-03-08 ENCOUNTER — Ambulatory Visit (INDEPENDENT_AMBULATORY_CARE_PROVIDER_SITE_OTHER): Payer: 59 | Admitting: Internal Medicine

## 2011-03-08 ENCOUNTER — Encounter: Payer: Self-pay | Admitting: Internal Medicine

## 2011-03-08 VITALS — BP 106/72 | HR 80 | Wt 215.0 lb

## 2011-03-08 DIAGNOSIS — I472 Ventricular tachycardia: Secondary | ICD-10-CM

## 2011-03-08 NOTE — Patient Instructions (Signed)
Will call patient to schedule admission after Dr Ladona Ridgel discusses case with Dr Graciela Husbands

## 2011-03-09 ENCOUNTER — Encounter: Payer: Self-pay | Admitting: Internal Medicine
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
## 2011-03-09 NOTE — Assessment & Plan Note (Signed)
The patient has had recurrent symptomatic VT. He has not yet been on any anti-arrhythmic meds. I have discussed the pro's and con's of catheter ablation with the patient as well as the alternative of anti-arrhythmic therapy with the requirement of admit for sotalol. I have also discussed these issues with Dr. Graciela Husbands. Will plan to proceed with inpatient admit for initiation of Sotalol. If he is intolerant of sotalol or has recurrent VT on sotalol, then catheter ablation would be warranted.

## 2011-04-08 ENCOUNTER — Telehealth: Payer: Self-pay | Admitting: Internal Medicine

## 2011-04-08 DIAGNOSIS — I428 Other cardiomyopathies: Secondary | ICD-10-CM

## 2011-04-08 DIAGNOSIS — I509 Heart failure, unspecified: Secondary | ICD-10-CM

## 2011-04-08 DIAGNOSIS — R55 Syncope and collapse: Secondary | ICD-10-CM

## 2011-04-08 NOTE — Telephone Encounter (Signed)
Spoke with patients wife he is having problems with his finger tips turning white and problems with ED.  He has also just started a new job and will wait until orientation is complete to come in for Sotalol.  I discussed the issues with Dr Graciela Husbands and he suggested the patient decrease his dose of Carvedilol to 6.25mg  bid.  I have called the wife back and let her know this.  If his symptoms do not improve she will call back and let us know.  I will forward to Dr Odessa Fleming nurse.  Dr Graciela Husbands says he may need to discuss with Dr Ladona Ridgel next week in regards to side effects of medications

## 2011-04-08 NOTE — Telephone Encounter (Signed)
New msg Pt's wife called about scheduling ablation. She hasnt heard anything Please call

## 2011-04-19 NOTE — Telephone Encounter (Signed)
Did you talk to Dr. Ladona Ridgel about side effects of meds? What do I need to be doing with this patient?

## 2011-04-27 NOTE — Telephone Encounter (Signed)
Forwarding to MD

## 2011-04-27 NOTE — Telephone Encounter (Signed)
I had a lengthy discussion with the patient's wife who is a CCU nurse. They're concerned about sexual dysfunction depression and mental cloudiness associated with beta blockers. Because of that, we will anticipate not using sotalol or rather dofetilide as an antiarrhythmic to try to suppress his ventricular tachycardia prior to pursuing catheter ablation.  In addition I have suggested that we decrease the carvedilol from 6.25-3.125 twice a day and see if any of the aforementioned symptoms improve.

## 2011-09-26 ENCOUNTER — Telehealth: Payer: Self-pay | Admitting: Internal Medicine

## 2011-09-26 NOTE — Telephone Encounter (Signed)
Pt calling re adding a medicine, and questions re appt

## 2011-09-26 NOTE — Telephone Encounter (Signed)
I spoke with the patient. He states that he is having trouble with his box at home to transmit on his device. He thinks the problem is with his phone lines, but he is not sure. He thinks he just needs to come in for checks. I explained I would have Belenda Cruise call him to discuss. He also had his dose of coreg decreased in April due to sexual dysfunction. He feels that he his is still struggling with this. He is asking if Dr. Graciela Husbands would be willing to prescribe something like Viagra for him. He states he previously took this at a 50 mg dose when he was having issues with testicular cancer, but he was able to come off of it. I explained I would forward the message to him to see if he is willing to do this. We will call him back with recommendations.

## 2011-09-26 NOTE — Telephone Encounter (Signed)
Pt scheduled for device check 10-06-2011 @ 1600. Pt aware of appt and unable to send transmissions due to phone line/kwm

## 2011-10-06 ENCOUNTER — Ambulatory Visit (INDEPENDENT_AMBULATORY_CARE_PROVIDER_SITE_OTHER): Payer: 59 | Admitting: Emergency Medicine

## 2011-10-06 ENCOUNTER — Encounter: Payer: Self-pay | Admitting: Internal Medicine

## 2011-10-06 ENCOUNTER — Ambulatory Visit (INDEPENDENT_AMBULATORY_CARE_PROVIDER_SITE_OTHER): Payer: 59 | Admitting: *Deleted

## 2011-10-06 ENCOUNTER — Ambulatory Visit: Payer: 59

## 2011-10-06 VITALS — BP 146/83 | HR 70 | Temp 97.8°F | Resp 16 | Ht 76.5 in | Wt 204.0 lb

## 2011-10-06 DIAGNOSIS — S61209A Unspecified open wound of unspecified finger without damage to nail, initial encounter: Secondary | ICD-10-CM

## 2011-10-06 DIAGNOSIS — S61211A Laceration without foreign body of left index finger without damage to nail, initial encounter: Secondary | ICD-10-CM

## 2011-10-06 DIAGNOSIS — I472 Ventricular tachycardia: Secondary | ICD-10-CM

## 2011-10-06 DIAGNOSIS — Z23 Encounter for immunization: Secondary | ICD-10-CM

## 2011-10-06 LAB — ICD DEVICE OBSERVATION
BATTERY VOLTAGE: 3.1722 v
BRDY-0002RV: 40 {beats}/min
CHARGE TIME: 8.968 s
DEV-0020ICD: NEGATIVE
FVT: 0
PACEART VT: 0
RV LEAD AMPLITUDE: 14.625 mv
RV LEAD IMPEDENCE ICD: 418 Ohm
RV LEAD THRESHOLD: 0.625 v
TOT-0001: 1
TOT-0002: 1
TOT-0006: 20120208000000
TZAT-0001FASTVT: 1
TZAT-0001SLOWVT: 1
TZAT-0002FASTVT: NEGATIVE
TZAT-0004SLOWVT: 8
TZAT-0005SLOWVT: 88 pct
TZAT-0011SLOWVT: 10 ms
TZAT-0012FASTVT: 200 ms
TZAT-0012SLOWVT: 200 ms
TZAT-0013SLOWVT: 4
TZAT-0018FASTVT: NEGATIVE
TZAT-0018SLOWVT: NEGATIVE
TZAT-0019FASTVT: 8 v
TZAT-0019SLOWVT: 8 v
TZAT-0020FASTVT: 1.5 ms
TZAT-0020SLOWVT: 1.5 ms
TZON-0003SLOWVT: 330 ms
TZON-0003VSLOWVT: 400 ms
TZON-0004SLOWVT: 36
TZON-0004VSLOWVT: 40
TZON-0005SLOWVT: 12
TZST-0001FASTVT: 2
TZST-0001FASTVT: 3
TZST-0001FASTVT: 4
TZST-0001FASTVT: 5
TZST-0001FASTVT: 6
TZST-0001SLOWVT: 2
TZST-0001SLOWVT: 3
TZST-0001SLOWVT: 4
TZST-0001SLOWVT: 5
TZST-0001SLOWVT: 6
TZST-0002FASTVT: NEGATIVE
TZST-0002FASTVT: NEGATIVE
TZST-0002FASTVT: NEGATIVE
TZST-0002FASTVT: NEGATIVE
TZST-0002FASTVT: NEGATIVE
TZST-0003SLOWVT: 20 J
TZST-0003SLOWVT: 35 J
TZST-0003SLOWVT: 35 J
TZST-0003SLOWVT: 35 J
TZST-0003SLOWVT: 35 J
VENTRICULAR PACING ICD: 0.01 pct
VF: 0

## 2011-10-06 MED ORDER — DOXYCYCLINE HYCLATE 100 MG PO CAPS: 100.0000 mg | ORAL_CAPSULE | Freq: Two times a day (BID) | ORAL | Status: DC

## 2011-10-06 NOTE — Progress Notes (Signed)
  Subjective:    Patient ID: [name], male    DOB: 01-Mar-1970, 41 y.o.   MRN: [id number]  HPI Patient was in his usual state of health until yesterday when while using a band saw to cut some wood he sustained in a laceration to his left index finger. He fell today andhis skin back over the wound and try to clean it last night and enters today for checkup   Review of Systems     Objective:   Physical Exam there is a 1 x 1 cm a flap area over the tip of the left index finger which is attached by only a small flap of skin. The flap of skin over the tip has no blood flow through it.  [facility] reading (PRIMARY) by  Dr. Cleta Alberts no fracture seen no foreign bodies      Assessment & Plan:  Plan update tetanus x-ray to rule out foreign body treat with soap and water cleaning 3-4 times a day followed by dry sterile wrap recheck 1 week. Patient was advised to tip has no blood supply .

## 2011-10-06 NOTE — Progress Notes (Signed)
ICD check 

## 2011-10-06 NOTE — Patient Instructions (Signed)
Patient will keep the area clean with soap and water and apply a splint but he does not hit the area at work and to return if he develops any purulence around the laceration site

## 2011-10-17 ENCOUNTER — Telehealth: Payer: Self-pay | Admitting: Internal Medicine

## 2011-10-17 NOTE — Telephone Encounter (Signed)
plz return call to pt hm#, regarding questions about a prescription change.

## 2011-10-18 ENCOUNTER — Other Ambulatory Visit: Payer: Self-pay | Admitting: *Deleted

## 2011-10-18 MED ORDER — SILDENAFIL CITRATE 50 MG PO TABS: 50.0000 mg | ORAL_TABLET | Freq: Every day | ORAL | Status: DC | PRN

## 2011-10-18 NOTE — Telephone Encounter (Signed)
Viagra Rx sent in to pharmacy.

## 2011-10-18 NOTE — Telephone Encounter (Signed)
Pt called to request an Rx for Viagra 50mg .  Will discuss with Dr. Graciela Husbands.

## 2011-10-27 ENCOUNTER — Encounter: Payer: Self-pay | Admitting: Internal Medicine

## 2011-10-27 ENCOUNTER — Ambulatory Visit (INDEPENDENT_AMBULATORY_CARE_PROVIDER_SITE_OTHER): Payer: 59 | Admitting: Internal Medicine

## 2011-10-27 VITALS — BP 150/73 | HR 70 | Ht 77.0 in | Wt 210.0 lb

## 2011-10-27 DIAGNOSIS — Z95810 Presence of automatic (implantable) cardiac defibrillator: Secondary | ICD-10-CM

## 2011-10-27 DIAGNOSIS — I429 Cardiomyopathy, unspecified: Secondary | ICD-10-CM

## 2011-10-27 DIAGNOSIS — I472 Ventricular tachycardia: Secondary | ICD-10-CM

## 2011-10-27 DIAGNOSIS — I119 Hypertensive heart disease without heart failure: Secondary | ICD-10-CM

## 2011-10-27 NOTE — Assessment & Plan Note (Signed)
As above.

## 2011-10-27 NOTE — Assessment & Plan Note (Signed)
.  dfnb The patient's device was interrogated.  The information was reviewed. No changes were made in the programming.    

## 2011-10-27 NOTE — Progress Notes (Signed)
skf Patient has no care team.   HPI  [name] is a 41 y.o. male Seen in followup for ICD implanted for secondary prevention. 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 Most recent ejection fraction was 25% or so.  He has not been seen since device implantation February 2012. He had an episode in the lobby today of presyncope. He has also had episodes over the year of sustained tachycardia palpitations of up to 2 hours duration associated with lightheadedness and shortness of breath.  He was seen by Dr. Ladona Ridgel for consideration of catheter ablation but was decided to undertake a trial with use of sotalol which apparently was never consummated  He decrease the salt in his diet and has felt much less tachycardia. He comes in today at our request because of  r a prolonged (greater than 50 seconds) sustained but asymptomatic episode of ventricular tachycardia  He did not tolerate higher doses of carvedilol with secondary Raynaud's; he is not sure if he is taking ACE inhibitors. Last blood work was checked 2012-February   Past Medical History  Diagnosis Date  . Systolic congestive heart failure     With ejection fraction of 25%, normal coronaries on Cath 02/2010  . Hypertension   . Syncope     episode occured Feb 2011, ICD placed  . 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. Asthma   . ADHD (attention deficit hyperactivity disorder)   . ETOH abuse     Past Surgical History  Procedure Date  . Orchiectomy     with abdominal lymph node dissection  . Wrist fracture surgery     Current Outpatient Prescriptions  Medication Sig Dispense Refill  . amphetamine-dextroamphetamine (ADDERALL XR) 30 MG 24 hr capsule Take 30 mg by mouth every morning.       Marland Kitchen aspirin 81 MG tablet Take 81 mg by mouth daily.        Marland Kitchen buPROPion (WELLBUTRIN SR) 150 MG 12 hr tablet Take 150 mg by mouth daily.      . calcium carbonate-magnesium  hydroxide (ROLAIDS) 334 MG CHEW Chew 1 tablet by mouth as needed.        . carvedilol (COREG) 12.5 MG tablet Take 0.5 tablets (6.25 mg total) by mouth 2 (two) times daily with a meal.  180 tablet  3  . clonazePAM (KLONOPIN) 0.5 MG tablet Take 0.5 mg by mouth once a week.       . Glucosamine-Chondroit-Vit C-Mn (GLUCOSAMINE 1500 COMPLEX PO) Take 1 tablet by mouth daily.        . ramipril (ALTACE) 2.5 MG tablet Take 2.5 mg by mouth daily.        . sildenafil (VIAGRA) 50 MG tablet Take 1 tablet (50 mg total) by mouth daily as needed for erectile dysfunction.  10 tablet  2  . spironolactone (ALDACTONE) 25 MG tablet Take 0.5 tablets (12.5 mg total) by mouth daily.  45 tablet  3  . albuterol (PROVENTIL HFA;VENTOLIN HFA) 108 (90 BASE) MCG/ACT inhaler Inhale 2 puffs into the lungs 4 (four) times daily as needed for wheezing.  1 Inhaler  5    No Known Allergies  Review of Systems negative except from HPI and PMH  Physical Exam BP 150/73  Pulse 70  Ht 6\' 5"  (1.956 m)  Wt 210 lb (95.255 kg)  BMI 24.90 kg/m2 Well developed and well nourished in no acute distress     Assessment  and  Plan

## 2011-10-27 NOTE — Patient Instructions (Signed)
Your physician wants you to follow-up in: 6 MONTHS WITH DR Logan Bores will receive a reminder letter in the mail two months in advance. If you don't receive a letter, please call our office to schedule the follow-up appointment.    Your physician recommends that you return for lab work in: 2 WEEKS  CALL REGARDING RAMAPRIL

## 2011-10-27 NOTE — Assessment & Plan Note (Signed)
The patient has had asymptomatic prolonged ventricular tachycardia. We had a multitude of discussions regarding the balancing  of prolonged tachycardia and risk of inappropriate shock. With the [employer] device are maximum duration is 100 beats which would be about 30 seconds. We will plan not to do this at this time and follow the frequency of events.

## 2011-10-27 NOTE — Assessment & Plan Note (Signed)
He will let us know whether he is on an ACE inhibitor or not. His blood pressure is excessive in the setting of his cardiomyopathy. We will either increase it or initiated with a metabolic profile to follow in 2 weeks. He is intolerant of higher doses of carvedilol

## 2011-10-28 ENCOUNTER — Telehealth: Payer: Self-pay | Admitting: Internal Medicine

## 2011-10-28 MED ORDER — RAMIPRIL 5 MG PO TABS: 5.0000 mg | ORAL_TABLET | Freq: Every day | ORAL | Status: DC

## 2011-10-28 NOTE — Telephone Encounter (Signed)
Discussed with dr Graciela Husbands, pt to restart ramipril at 5 mg daily. Pt aware

## 2011-10-28 NOTE — Telephone Encounter (Signed)
New Problem:    Patient called in to say that he is not on ramipril (ALTACE) 2.5 MG tablet at all, and if he needs to take it, to call it into the [facility] pharmacy.  Please call back.

## 2011-11-04 ENCOUNTER — Telehealth: Payer: Self-pay | Admitting: Internal Medicine

## 2011-11-04 NOTE — Telephone Encounter (Signed)
Pt. was advised to resume Ramipril at last OV with Dr. Graciela Husbands on 10/24 as he nor Dr. Graciela Husbands could remember why he stopped taking.  Pt. states that since he re-started taking Ramipril he has dry cough, been light headed and having headaches and he now remembers that is why he could not tolerate in the past. Pt. refuses to take and wants to know what Dr. Graciela Husbands recommends. Please advise.

## 2011-11-04 NOTE — Telephone Encounter (Signed)
Pt calling re ramipril, having side effects, pls call

## 2011-11-10 ENCOUNTER — Other Ambulatory Visit: Payer: 59

## 2011-11-11 NOTE — Telephone Encounter (Signed)
**Note De-identified Culley Hedeen Obfuscation** LMTCB

## 2011-11-11 NOTE — Telephone Encounter (Signed)
ACE inhibitors are commonly associated with a cough. And ARB is related but not associated with a cough and is equally beneficial. Would recommend losartan 25 mg daily

## 2011-11-15 MED ORDER — LOSARTAN POTASSIUM 25 MG PO TABS: 25.0000 mg | ORAL_TABLET | Freq: Every day | ORAL | Status: DC

## 2011-11-15 NOTE — Telephone Encounter (Signed)
**Note De-Identified Dink Creps Obfuscation** Pt. advised, he verbalized understanding. RX sent to [facility] to fill.

## 2011-11-15 NOTE — Addendum Note (Signed)
**Note De-Identified Chennel Olivos Obfuscation** Addended by: Demetrios Loll on: 11/15/2011 12:23 PM   Modules accepted: Orders

## 2012-01-04 HISTORY — PX: CARDIAC DEFIBRILLATOR PLACEMENT: SHX171

## 2012-02-03 ENCOUNTER — Ambulatory Visit: Payer: 59 | Admitting: Family Medicine

## 2012-02-20 ENCOUNTER — Ambulatory Visit: Payer: 59

## 2012-02-20 ENCOUNTER — Ambulatory Visit (INDEPENDENT_AMBULATORY_CARE_PROVIDER_SITE_OTHER): Payer: 59 | Admitting: Family Medicine

## 2012-02-20 VITALS — BP 125/85 | HR 76 | Temp 98.0°F | Resp 18 | Ht 77.0 in | Wt 209.2 lb

## 2012-02-20 DIAGNOSIS — J209 Acute bronchitis, unspecified: Secondary | ICD-10-CM

## 2012-02-20 DIAGNOSIS — R059 Cough, unspecified: Secondary | ICD-10-CM

## 2012-02-20 DIAGNOSIS — J45901 Unspecified asthma with (acute) exacerbation: Secondary | ICD-10-CM

## 2012-02-20 DIAGNOSIS — R05 Cough: Secondary | ICD-10-CM

## 2012-02-20 DIAGNOSIS — J45909 Unspecified asthma, uncomplicated: Secondary | ICD-10-CM

## 2012-02-20 MED ORDER — AZITHROMYCIN 250 MG PO TABS: ORAL_TABLET | ORAL | Status: DC

## 2012-02-20 MED ORDER — ALBUTEROL SULFATE HFA 108 (90 BASE) MCG/ACT IN AERS: 2.0000 | INHALATION_SPRAY | Freq: Four times a day (QID) | RESPIRATORY_TRACT | Status: DC | PRN

## 2012-02-20 MED ORDER — HYDROCODONE-HOMATROPINE 5-1.5 MG/5ML PO SYRP: 5.0000 mL | ORAL_SOLUTION | Freq: Three times a day (TID) | ORAL | Status: DC | PRN

## 2012-02-20 MED ORDER — PREDNISONE 20 MG PO TABS: 40.0000 mg | ORAL_TABLET | Freq: Every day | ORAL | Status: DC

## 2012-02-20 NOTE — Patient Instructions (Addendum)
Asthma, F.L.A.R.E.  Most people with asthma do not get so sick that they need emergency care. The fact that you had to get emergency care may mean:  · You are not taking your long-term control medicine the right way.  · You have not been prescribed any or enough long-term control medicine.  · You are still exposed to triggers that start your asthma symptoms.  You can avoid asthma flare-ups by using this F.L.A.R.E. plan until you see your primary doctor.  F  FOLLOW UP WITH YOUR PRIMARY DOCTOR - CALL TO MAKE AN APPOINTMENT TO BE SEEN.  · If you have trouble making an appointment, ask to speak to the office nurse.  · If you do not have a primary care doctor, call to get one.  At the follow-up appointment:  · Bring all of your medications and this plan with you.  · Make an asthma action plan with your doctor that you can follow every day to keep your asthma under control.  · Write down your questions and your doctor's answers.  This will make your emergency visits rare.  L  LEARN ABOUT YOUR ASTHMA MEDICINES. TAKE ALL OF THESE MEDICINES JUST AS THE DOCTOR TELLS YOU, EVEN IF YOU ARE FEELING MUCH BETTER.  Quick-relief or Rescue  · Kind of medicine  · Name of medicine  · How much  · How often & how long you need to take it  Long-term control  · Kind of medicine  · Name of medicine  · How much  · How often & how long you need to take it  Steroid pills or syrup  · Kind of medicine  · Name of medicine  · How much  · How often & how long you need to take it  A  ASTHMA IS A LIFE-LONG (CHRONIC) DISEASE.  Even though your breathing is better after getting emergency care, you still need to get long-term control of your asthma. If you do not, you are at risk for more severe flare-ups and even death.  · If you use quick-relief medicine more than 2 times per week, your asthma is not under control. You need to see your doctor or an asthma specialist to make a plan to get control of your asthma.  · Take long-term control medicine every  day as ordered by your doctor.  · Figure out what things make your asthma flare up and try to stay away from these "triggers."  R  RESPOND TO THESE WARNING SIGNS THAT YOUR ASTHMA IS GETTING WORSE:  · Your chest feels tight.  · You are short of breath.  · You are wheezing.  · You are coughing.  · Your peak flow is getting low.  Keep taking your medicines as prescribed and call your doctor.  E  EMERGENCY CARE MAY BE NEEDED IF YOU:  · Have trouble talking.  · Are working hard to breathe (may see skin sucking in at rib cage or above breast bone).  · Need to use quick-relief medicine more often than every 4 hours.  · See your peak flow dropping.  Take your quick-relief medicine and wait 20 minutes. If you do not feel better, take it again and wait 20 minutes. If you still do not feel better, take it again and call your doctor or 911 right away!  LEARN ABOUT ASTHMA  Asthma is a life-long disease that can make it hard for you to get air in and out of your lungs. Your   asthma triggers make the air tubes in your lungs get smaller. These are the tubes that carry air in and out of your lungs.  Here is what happens:  · Small breathing tubes in your lungs swell and make extra mucus.  · Muscles around the small breathing tubes get tight and make them smaller.  · Smaller breathing tubes then get clogged with the extra mucus.  · Swelling, muscle tightness, and mucus make it harder for you to breathe. You start to cough and wheeze and your chest might feel tight.  Not all asthma flare-ups are the same. Some are worse than others. In a severe asthma flare-up, the breathing tubes get so small that air cannot get in and out of the lungs. People can die if their asthma flare-up is severe.  ASTHMA MEDICINES  · Quick-relief or Rescue medicine: should help for about 4 hours, relaxes the muscles around your breathing tubes so air can get in and out. If you need to take quick-relief medicine more than 2 times per week, your asthma is not  under control, and you should ask your doctor about long-term control medicine.  · Long-term control medicine: must be taken every day in order to work right. It keeps your breathing tubes from swelling, and can prevent most asthma flare-ups. This medicine cannot stop a flare-up once it starts. During flare-ups, use quick-relief medicine right away and take your long-term control medicine as usual.  · Steroid pills or syrup: can help the swelling in your breathing tubes go away. You must take this medicine just as the doctor tells you to. Do not skip a dose, and do not stop taking it unless a doctor tells you to stop.  TRIGGERS: TELL YOUR DOCTOR ABOUT THE THINGS THAT MAKE YOUR ASTHMA WORSE.  What started or triggered your asthma flare-up this time?  COMMON ASTHMA TRIGGERS:  · Breathing in chemicals, dusts, or fumes at work.  · Colds or flu.  · Animals.  · Dust.  · Pollen and mold.  · Strong odors.  · Weather.  · Exercise.  · Cigarette and other smoke.  · Medicines.  Smoking and secondhand smoke are asthma triggers. If you smoke, choose to quit. Never let others smoke near you or your children. Call your doctor or your health plan for help quitting.  FOR MORE INFORMATION  Asthma Initiative of [place]: www.GetAsthmaHelp.org  American Lung Association: www.lungusa.org  Asthma and Allergy Foundation of America: www.aafa.org  This plan and asthma information are based on the NAEPP Guidelines for the Diagnosis and Management of Asthma, (http://www.nhlbi.nig.gov/guidelines/asthma/asthgdln.htm).  Document Released: 07/28/2005 Document Revised: 03/14/2011 Document Reviewed: 10/06/2005  ExitCare® Patient Information ©2013 [employer].

## 2012-02-20 NOTE — Progress Notes (Signed)
Subjective:    Patient ID: [name], male    DOB: 12-Nov-1970, 42 y.o.   MRN: [id number] Chief Complaint  Patient presents with  . Cough    3 weeks dry cough    HPI  [name] is a 42 yo with a past medical history of systolic CHF and asthma.  Wife had similar syptoms and was treated with antibiotics and hers resolved but his has continued to worsen.  He does have an albuterol inhaler at home which he has been using with this illness - about once a day.  occ shob or wheezing, no cp.  CHF idiopathic - no symptoms.  Has been getting better or worse and now developing some nasal congestion.   Past Medical History  Diagnosis Date  . Systolic congestive heart failure     With ejection fraction of 25%, normal coronaries on Cath 02/2010  . Hypertension   . Syncope     episode occured Feb 2011, ICD placed  . 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. Asthma   . ADHD (attention deficit hyperactivity disorder)   . ETOH abuse    Current Outpatient Prescriptions on File Prior to Visit  Medication Sig Dispense Refill  . amphetamine-dextroamphetamine (ADDERALL XR) 30 MG 24 hr capsule Take 30 mg by mouth every morning.       Marland Kitchen aspirin 81 MG tablet Take 81 mg by mouth daily.        Marland Kitchen buPROPion (WELLBUTRIN SR) 150 MG 12 hr tablet Take 150 mg by mouth daily.      . calcium carbonate-magnesium hydroxide (ROLAIDS) 334 MG CHEW Chew 1 tablet by mouth as needed.        . carvedilol (COREG) 12.5 MG tablet Take 0.5 tablets (6.25 mg total) by mouth 2 (two) times daily with a meal.  180 tablet  3  . clonazePAM (KLONOPIN) 0.5 MG tablet Take 0.5 mg by mouth once a week.       . Glucosamine-Chondroit-Vit C-Mn (GLUCOSAMINE 1500 COMPLEX PO) Take 1 tablet by mouth daily.        Marland Kitchen losartan (COZAAR) 25 MG tablet Take 1 tablet (25 mg total) by mouth daily.  90 tablet  0  . sildenafil (VIAGRA) 50 MG tablet Take 1 tablet (50 mg total) by mouth daily as needed for erectile dysfunction.  10 tablet  2  . spironolactone (ALDACTONE) 25  MG tablet Take 0.5 tablets (12.5 mg total) by mouth daily.  45 tablet  3   No current facility-administered medications on file prior to visit.   No Known Allergies   Review of Systems  Constitutional: Positive for activity change, appetite change and fatigue. Negative for fever and chills.  HENT: Positive for congestion and rhinorrhea. Negative for ear pain, sore throat, sneezing, trouble swallowing, neck pain, neck stiffness, postnasal drip, sinus pressure and ear discharge.   Respiratory: Positive for cough, shortness of breath and wheezing. Negative for apnea, choking and chest tightness.   Cardiovascular: Negative for chest pain, palpitations and leg swelling.  Gastrointestinal: Negative for nausea, vomiting, abdominal pain, diarrhea and constipation.  Genitourinary: Negative for dysuria and difficulty urinating.  Musculoskeletal: Negative for myalgias.  Neurological: Negative for syncope.  Hematological: Negative for adenopathy.  Psychiatric/Behavioral: Positive for sleep disturbance.      BP 125/85  Pulse 76  Temp(Src) 98 F (36.7 C) (Oral)  Resp 18  Ht 6\' 5"  (1.956 m)  Wt 209 lb 3.2 oz (94.892 kg)  BMI 24.8 kg/m2  SpO2 96% Objective:  Physical Exam  Constitutional: He is oriented to person, place, and time. He appears well-developed and well-nourished. No distress.  HENT:  Head: Normocephalic and atraumatic.  Right Ear: External ear and ear canal normal. Tympanic membrane is retracted. A middle ear effusion is present.  Left Ear: External ear and ear canal normal. Tympanic membrane is retracted. A middle ear effusion is present.  Nose: Mucosal edema and rhinorrhea present. Right sinus exhibits maxillary sinus tenderness. Left sinus exhibits maxillary sinus tenderness.  Mouth/Throat: Uvula is midline and mucous membranes are normal. Posterior oropharyngeal erythema present. No oropharyngeal exudate or posterior oropharyngeal edema.  Eyes: Conjunctivae are normal. Right  eye exhibits no discharge. Left eye exhibits no discharge. No scleral icterus.  Neck: Normal range of motion. Neck supple. No thyromegaly present.  Cardiovascular: Normal rate, regular rhythm, normal heart sounds and intact distal pulses.   Pulmonary/Chest: Effort normal. No respiratory distress. He has no decreased breath sounds. He has wheezes. He has rhonchi in the right upper field, the right lower field and the left upper field. He has rales in the right lower field.  Expiratory wheezing diffuse throughout  Lymphadenopathy:       Head (right side): Submandibular adenopathy present.       Head (left side): Submandibular adenopathy present.    He has no cervical adenopathy.       Right: No supraclavicular adenopathy present.       Left: No supraclavicular adenopathy present.  Neurological: He is alert and oriented to person, place, and time.  Skin: Skin is warm and dry. He is not diaphoretic. No erythema.  Psychiatric: He has a normal mood and affect. His behavior is normal.     [facility] reading (PRIMARY) by  Dr. Clelia Croft.  No acute abnormality. ICD seen.   Assessment & Plan:  Cough - Plan: DG Chest 2 View  Asthma with acute exacerbation  Bronchitis with asthma, acute - Plan: azithromycin (ZITHROMAX) 250 MG tablet, predniSONE (DELTASONE) 20 MG tablet, HYDROcodone-homatropine (HYCODAN) 5-1.5 MG/5ML syrup, albuterol (PROVENTIL HFA;VENTOLIN HFA) 108 (90 BASE) MCG/ACT inhaler  Meds ordered this encounter  Medications  . azithromycin (ZITHROMAX) 250 MG tablet    Sig: Take 2 tabs PO x 1 dose, then 1 tab PO QD x 4 days    Dispense:  6 tablet    Refill:  0  . predniSONE (DELTASONE) 20 MG tablet    Sig: Take 2 tablets (40 mg total) by mouth daily.    Dispense:  10 tablet    Refill:  0  . HYDROcodone-homatropine (HYCODAN) 5-1.5 MG/5ML syrup    Sig: Take 5 mLs by mouth every 8 (eight) hours as needed for cough.    Dispense:  120 mL    Refill:  0  . albuterol (PROVENTIL HFA;VENTOLIN HFA) 108  (90 BASE) MCG/ACT inhaler    Sig: Inhale 2 puffs into the lungs 4 (four) times daily as needed for wheezing.    Dispense:  1 Inhaler    Refill:  5

## 2012-04-05 ENCOUNTER — Other Ambulatory Visit: Payer: Self-pay | Admitting: Internal Medicine

## 2012-04-27 ENCOUNTER — Ambulatory Visit (INDEPENDENT_AMBULATORY_CARE_PROVIDER_SITE_OTHER): Payer: 59 | Admitting: Family Medicine

## 2012-04-27 VITALS — BP 133/81 | HR 80 | Temp 98.3°F | Resp 16 | Ht 77.0 in | Wt 206.0 lb

## 2012-04-27 DIAGNOSIS — J309 Allergic rhinitis, unspecified: Secondary | ICD-10-CM

## 2012-04-27 DIAGNOSIS — J019 Acute sinusitis, unspecified: Secondary | ICD-10-CM

## 2012-04-27 MED ORDER — AMOXICILLIN-POT CLAVULANATE 875-125 MG PO TABS: 1.0000 | ORAL_TABLET | Freq: Two times a day (BID) | ORAL | Status: DC

## 2012-04-27 MED ORDER — FLUTICASONE PROPIONATE 50 MCG/ACT NA SUSP: 2.0000 | Freq: Every day | NASAL | Status: DC

## 2012-04-27 NOTE — Patient Instructions (Signed)
Sinusitis Sinusitis is redness, soreness, and swelling (inflammation) of the paranasal sinuses. Paranasal sinuses are air pockets within the bones of your face (beneath the eyes, the middle of the forehead, or above the eyes). In healthy paranasal sinuses, mucus is able to drain out, and air is able to circulate through them by way of your nose. However, when your paranasal sinuses are inflamed, mucus and air can become trapped. This can allow bacteria and other germs to grow and cause infection. Sinusitis can develop quickly and last only a short time (acute) or continue over a long period (chronic). Sinusitis that lasts for more than 12 weeks is considered chronic.  CAUSES  Causes of sinusitis include:  Allergies.  Structural abnormalities, such as displacement of the cartilage that separates your nostrils (deviated septum), which can decrease the air flow through your nose and sinuses and affect sinus drainage.  Functional abnormalities, such as when the small hairs (cilia) that line your sinuses and help remove mucus do not work properly or are not present. SYMPTOMS  Symptoms of acute and chronic sinusitis are the same. The primary symptoms are pain and pressure around the affected sinuses. Other symptoms include:  Upper toothache.  Earache.  Headache.  Bad breath.  Decreased sense of smell and taste.  A cough, which worsens when you are lying flat.  Fatigue.  Fever.  Thick drainage from your nose, which often is green and may contain pus (purulent).  Swelling and warmth over the affected sinuses. DIAGNOSIS  Your caregiver will perform a physical exam. During the exam, your caregiver may:  Look in your nose for signs of abnormal growths in your nostrils (nasal polyps).  Tap over the affected sinus to check for signs of infection.  View the inside of your sinuses (endoscopy) with a special imaging device with a light attached (endoscope), which is inserted into your  sinuses. If your caregiver suspects that you have chronic sinusitis, one or more of the following tests may be recommended:  Allergy tests.  Nasal culture A sample of mucus is taken from your nose and sent to a lab and screened for bacteria.  Nasal cytology A sample of mucus is taken from your nose and examined by your caregiver to determine if your sinusitis is related to an allergy. TREATMENT  Most cases of acute sinusitis are related to a viral infection and will resolve on their own within 10 days. Sometimes medicines are prescribed to help relieve symptoms (pain medicine, decongestants, nasal steroid sprays, or saline sprays).  However, for sinusitis related to a bacterial infection, your caregiver will prescribe antibiotic medicines. These are medicines that will help kill the bacteria causing the infection.  Rarely, sinusitis is caused by a fungal infection. In theses cases, your caregiver will prescribe antifungal medicine. For some cases of chronic sinusitis, surgery is needed. Generally, these are cases in which sinusitis recurs more than 3 times per year, despite other treatments. HOME CARE INSTRUCTIONS   Drink plenty of water. Water helps thin the mucus so your sinuses can drain more easily.  Use a humidifier.  Inhale steam 3 to 4 times a day (for example, sit in the bathroom with the shower running).  Apply a warm, moist washcloth to your face 3 to 4 times a day, or as directed by your caregiver.  Use saline nasal sprays to help moisten and clean your sinuses.  Take over-the-counter or prescription medicines for pain, discomfort, or fever only as directed by your caregiver. SEEK IMMEDIATE MEDICAL   CARE IF:  You have increasing pain or severe headaches.  You have nausea, vomiting, or drowsiness.  You have swelling around your face.  You have vision problems.  You have a stiff neck.  You have difficulty breathing. MAKE SURE YOU:   Understand these  instructions.  Will watch your condition.  Will get help right away if you are not doing well or get worse. Document Released: 12/20/2004 Document Revised: 03/14/2011 Document Reviewed: 01/04/2011 Exit[employer] Patient Information 2013 [employer]. Allergic Rhinitis Allergic rhinitis is when the mucous membranes in the nose respond to allergens. Allergens are particles in the air that cause your body to have an allergic reaction. This causes you to release allergic antibodies. Through a chain of events, these eventually cause you to release histamine into the blood stream (hence the use of antihistamines). Although meant to be protective to the body, it is this release that causes your discomfort, such as frequent sneezing, congestion and an itchy runny nose.  CAUSES  The pollen allergens may come from grasses, trees, and weeds. This is seasonal allergic rhinitis, or "hay fever." Other allergens cause year-round allergic rhinitis (perennial allergic rhinitis) such as house dust mite allergen, pet dander and mold spores.  SYMPTOMS   Nasal stuffiness (congestion).  Runny, itchy nose with sneezing and tearing of the eyes.  There is often an itching of the mouth, eyes and ears. It cannot be cured, but it can be controlled with medications. DIAGNOSIS  If you are unable to determine the offending allergen, skin or blood testing may find it. TREATMENT   Avoid the allergen.  Medications and allergy shots (immunotherapy) can help.  Hay fever may often be treated with antihistamines in pill or nasal spray forms. Antihistamines block the effects of histamine. There are over-the-counter medicines that may help with nasal congestion and swelling around the eyes. Check with your caregiver before taking or giving this medicine. If the treatment above does not work, there are many new medications your caregiver can prescribe. Stronger medications may be used if initial measures are ineffective.  Desensitizing injections can be used if medications and avoidance fails. Desensitization is when a patient is given ongoing shots until the body becomes less sensitive to the allergen. Make sure you follow up with your caregiver if problems continue. SEEK MEDICAL CARE IF:   You develop fever (more than 100.5 F (38.1 C).  You develop a cough that does not stop easily (persistent).  You have shortness of breath.  You start wheezing.  Symptoms interfere with normal daily activities. Document Released: 09/14/2000 Document Revised: 03/14/2011 Document Reviewed: 03/26/2008 Exit[employer] Patient Information 2013 [employer].  

## 2012-04-27 NOTE — Progress Notes (Signed)
[facility]:  Office Visit  Chief Complaint:  Chief Complaint  Patient presents with  . Illness    aches, nasal congestion, allergies, epistaxis, cough    HPI: [name] is a 42 y.o. male who complains of  2 week history of nasal congestion. Fatigue, tiredness, HAs, dry cough for 1 week. Last 2-3 days had had more blood when he blows out of his nose. Tinge on tissue paper. Has"bogger scabs". Has been using netty pott, vit c, rest and also mucinex without relief. Denies fevers, chills  Past Medical History  Diagnosis Date  . Systolic congestive heart failure     With ejection fraction of 25%, normal coronaries on Cath 02/2010  . Hypertension   . Syncope     episode occured Feb 2011, ICD placed  . 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. Asthma   . ADHD (attention deficit hyperactivity disorder)   . ETOH abuse    Past Surgical History  Procedure Laterality Date  . Orchiectomy      with abdominal lymph node dissection  . Wrist fracture surgery     History   Social History  . Marital Status: Married    Spouse Name: N/A    Number of Children: N/A  . Years of Education: N/A   Occupational History  . Part time Art gallery manager    Social History Main Topics  . Smoking status: Never Smoker   . Smokeless tobacco: None  . Alcohol Use: Yes     Comment: Beer, quit 2/12  . Drug Use: None  . Sexually Active: Yes   Other Topics Concern  . None   Social History Narrative  . None   Family History  Problem Relation Age of Onset  . Pancreatic cancer Maternal Grandfather   . Heart disease Mother     mother and sister with valve problems  . Cardiomyopathy Neg Hx    No Known Allergies Prior to Admission medications   Medication Sig Start Date End Date Taking? Authorizing Provider  albuterol (PROVENTIL HFA;VENTOLIN HFA) 108 (90 BASE) MCG/ACT inhaler Inhale 2 puffs into the lungs 4 (four) times daily as needed for wheezing. 02/20/12 02/19/13 Yes Sherren Mocha, MD   amphetamine-dextroamphetamine (ADDERALL XR) 30 MG 24 hr capsule Take 30 mg by mouth every morning.    Yes Historical Provider, MD  aspirin 81 MG tablet Take 81 mg by mouth daily.     Yes Historical Provider, MD  buPROPion (WELLBUTRIN SR) 150 MG 12 hr tablet Take 150 mg by mouth daily.   Yes Historical Provider, MD  carvedilol (COREG) 12.5 MG tablet Take 0.5 tablets (6.25 mg total) by mouth 2 (two) times daily with a meal. 04/08/11  Yes Duke Salvia, MD  clonazePAM (KLONOPIN) 0.5 MG tablet Take 0.5 mg by mouth once a week.    Yes Historical Provider, MD  losartan (COZAAR) 25 MG tablet Take 1 tablet (25 mg total) by mouth daily. 11/15/11  Yes Duke Salvia, MD  sildenafil (VIAGRA) 50 MG tablet Take 1 tablet (50 mg total) by mouth daily as needed for erectile dysfunction. 10/18/11  Yes Duke Salvia, MD  spironolactone (ALDACTONE) 25 MG tablet TAKE 1/2 TABLET BY MOUTH DAILY 04/05/12  Yes Duke Salvia, MD  Glucosamine-Chondroit-Vit C-Mn (GLUCOSAMINE 1500 COMPLEX PO) Take 1 tablet by mouth daily.      Historical Provider, MD     ROS: The patient denies fevers, chills, night sweats, unintentional weight loss, chest pain, palpitations, wheezing,  dyspnea on exertion, nausea, vomiting, abdominal pain, dysuria, hematuria, melena, numbness, , or tingling.   All other systems have been reviewed and were otherwise negative with the exception of those mentioned in the HPI and as above.    PHYSICAL EXAM: Filed Vitals:   04/27/12 1529  BP: 133/81  Pulse: 80  Temp: 98.3 F (36.8 C)  Resp: 16   Filed Vitals:   04/27/12 1529  Height: 6\' 5"  (1.956 m)  Weight: 206 lb (93.441 kg)   Body mass index is 24.42 kg/(m^2).  General: Alert, no acute distress HEENT:  Normocephalic, atraumatic, oropharynx patent. Tm nl. + sinus tenderness, No exudates Cardiovascular:  Regular rate and rhythm, no rubs murmurs or gallops.  No Carotid bruits, radial pulse intact. No pedal edema.  Respiratory: Clear to  auscultation bilaterally.  No wheezes, rales, or rhonchi.  No cyanosis, no use of accessory musculature GI: No organomegaly, abdomen is soft and non-tender, positive bowel sounds.  No masses. Skin: No rashes. Neurologic: Facial musculature symmetric. Psychiatric: Patient is appropriate throughout our interaction. Lymphatic: No cervical lymphadenopathy Musculoskeletal: Gait intact.   LABS:    EKG/XRAY:   Primary read interpreted by Dr. Conley Rolls at [facility].   ASSESSMENT/PLAN: Encounter Diagnoses  Name Primary?  . Allergic rhinitis Yes  . Acute sinusitis    42 y/o male with acute sinsuitis and rhinitis with complicated heart hx including CHF, ICD placement and also ADHD. Advise against decongestants Rx Augmentin Rx Flonase F/u prn    Trenell Concannon PHUONG, DO 04/27/2012 3:53 PM

## 2012-06-05 ENCOUNTER — Telehealth: Payer: Self-pay | Admitting: Internal Medicine

## 2012-06-05 NOTE — Telephone Encounter (Signed)
Discussed with dr Graciela Husbands, pt made aware we do not write those letters. The pt will need to find something to put between the shoulder strap and his body to prevent irritation to the area. Pt voiced understanding.

## 2012-06-05 NOTE — Telephone Encounter (Signed)
New problem    Pt needs a written letter about his condition

## 2012-06-05 NOTE — Telephone Encounter (Signed)
Spoke with pt, he recently got a ticket for not wearing his seat belt. The pt reports he does wear his seat belt but puts the shoulder strap under his arm because otherwise it will rub his device area and cause discomfort. He is going to court to fight the ticket and wanted a letter from [place] to take with him. Will discuss with dr Graciela Husbands.

## 2012-07-04 ENCOUNTER — Other Ambulatory Visit: Payer: Self-pay | Admitting: Internal Medicine

## 2012-07-25 ENCOUNTER — Encounter: Payer: Self-pay | Admitting: *Deleted

## 2012-09-28 ENCOUNTER — Other Ambulatory Visit: Payer: Self-pay | Admitting: Internal Medicine

## 2012-10-04 ENCOUNTER — Other Ambulatory Visit: Payer: Self-pay | Admitting: Internal Medicine

## 2012-10-08 ENCOUNTER — Encounter: Payer: Self-pay | Admitting: Internal Medicine

## 2012-10-08 ENCOUNTER — Ambulatory Visit (INDEPENDENT_AMBULATORY_CARE_PROVIDER_SITE_OTHER): Payer: 59 | Admitting: Internal Medicine

## 2012-10-08 VITALS — BP 136/86 | HR 90 | Ht 77.0 in | Wt 204.0 lb

## 2012-10-08 DIAGNOSIS — I429 Cardiomyopathy, unspecified: Secondary | ICD-10-CM

## 2012-10-08 DIAGNOSIS — I472 Ventricular tachycardia: Secondary | ICD-10-CM

## 2012-10-08 DIAGNOSIS — Z95810 Presence of automatic (implantable) cardiac defibrillator: Secondary | ICD-10-CM

## 2012-10-08 LAB — ICD DEVICE OBSERVATION
BATTERY VOLTAGE: 3.14 V
BRDY-0002RV: 40 {beats}/min
CHARGE TIME: 9.4 s
DEV-0020ICD: NEGATIVE
FVT: 0
PACEART VT: 7
RV LEAD AMPLITUDE: 16.3 mv
RV LEAD IMPEDENCE ICD: 418 Ohm
RV LEAD THRESHOLD: 0.75 V
TZAT-0001FASTVT: 1
TZAT-0001SLOWVT: 1
TZAT-0001SLOWVT: 3
TZAT-0002FASTVT: NEGATIVE
TZAT-0004SLOWVT: 8
TZAT-0004SLOWVT: 8
TZAT-0005SLOWVT: 88 pct
TZAT-0005SLOWVT: 88 pct
TZAT-0011SLOWVT: 10 ms
TZAT-0011SLOWVT: 10 ms
TZAT-0012FASTVT: 200 ms
TZAT-0012SLOWVT: 200 ms
TZAT-0013SLOWVT: 4
TZAT-0013SLOWVT: 4
TZAT-0018FASTVT: NEGATIVE
TZAT-0018SLOWVT: NEGATIVE
TZAT-0018SLOWVT: NEGATIVE
TZAT-0019FASTVT: 8 V
TZAT-0019SLOWVT: 8 V
TZAT-0020FASTVT: 1.5 ms
TZAT-0020SLOWVT: 1.5 ms
TZON-0003SLOWVT: 330 ms
TZON-0003VSLOWVT: 400 ms
TZON-0004SLOWVT: 36
TZON-0004VSLOWVT: 40
TZON-0005SLOWVT: 12
TZST-0001FASTVT: 2
TZST-0001FASTVT: 3
TZST-0001FASTVT: 4
TZST-0001FASTVT: 5
TZST-0001FASTVT: 6
TZST-0001SLOWVT: 4
TZST-0001SLOWVT: 5
TZST-0001SLOWVT: 6
TZST-0001SLOWVT: 7
TZST-0002FASTVT: NEGATIVE
TZST-0002FASTVT: NEGATIVE
TZST-0002FASTVT: NEGATIVE
TZST-0002FASTVT: NEGATIVE
TZST-0002FASTVT: NEGATIVE
TZST-0003SLOWVT: 20 J
TZST-0003SLOWVT: 35 J
TZST-0003SLOWVT: 35 J
TZST-0003SLOWVT: 35 J
VENTRICULAR PACING ICD: 0 pct
VF: 0

## 2012-10-08 MED ORDER — LOSARTAN POTASSIUM 25 MG PO TABS: 25.0000 mg | ORAL_TABLET | Freq: Every day | ORAL | Status: DC

## 2012-10-08 NOTE — Assessment & Plan Note (Addendum)
Up titration of beta blockers as limited by his Raynaud's. We'll begin him on an ARB. We have reviewed side effects. We'll check a metabolic profile in 2 weeks. Renal function 2 years ago was normal

## 2012-10-08 NOTE — Assessment & Plan Note (Signed)
The patient's device was interrogated.  The information was reviewed. No changes were made in the programming.    

## 2012-10-08 NOTE — Progress Notes (Signed)
Patient has no care team.   HPI  [name] is a 42 y.o. male Seen in followup for ICD implanted for secondary prevention. 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 Most recent ejection fraction was 25% or so.  She has had a history of symptomatic sustained ventricular tachycardia. Discussions were had concerning catheter ablation. It was elected to use sotalol but that was never initiated. He has had recurrent prolonged ventricular tachycardia; his device has been programmed to minimize therapy for this.  Stable DOE, no edema        He did not tolerate higher doses of carvedilol with secondary Raynaud's; He has been intolerant of ACE  . Last blood work was checked 2012-February   Past Medical History  Diagnosis Date  . Systolic congestive heart failure     With ejection fraction of 25%, normal coronaries on Cath 02/2010  . Hypertension   . Syncope     episode occured Feb 2011, ICD placed  . 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. Asthma   . ADHD (attention deficit hyperactivity disorder)   . ETOH abuse     Past Surgical History  Procedure Laterality Date  . Orchiectomy      with abdominal lymph node dissection  . Wrist fracture surgery      Current Outpatient Prescriptions  Medication Sig Dispense Refill  . albuterol (PROVENTIL HFA;VENTOLIN HFA) 108 (90 BASE) MCG/ACT inhaler Inhale 2 puffs into the lungs 4 (four) times daily as needed for wheezing.  1 Inhaler  5  . amphetamine-dextroamphetamine (ADDERALL XR) 30 MG 24 hr capsule Take 30 mg by mouth every morning.       Marland Kitchen aspirin 81 MG tablet Take 81 mg by mouth daily.        Marland Kitchen buPROPion (WELLBUTRIN SR) 150 MG 12 hr tablet Take 150 mg by mouth daily.      . carvedilol (COREG) 12.5 MG tablet Take 0.5 tablets (6.25 mg total) by mouth 2 (two) times daily with a meal.  180 tablet  0  . fish oil-omega-3 fatty acids 1000 MG capsule Take 2 g by mouth  daily.      . Glucosamine-Chondroit-Vit C-Mn (GLUCOSAMINE 1500 COMPLEX PO) Take 1 tablet by mouth daily.        Marland Kitchen LORazepam (ATIVAN) 0.5 MG tablet Take 0.5 mg by mouth as needed for anxiety.      . sildenafil (VIAGRA) 50 MG tablet Take 1 tablet (50 mg total) by mouth daily as needed for erectile dysfunction.  10 tablet  2  . spironolactone (ALDACTONE) 25 MG tablet TAKE 1/2 TABLET BY MOUTH DAILY  45 tablet  0   No current facility-administered medications for this visit.    No Known Allergies  Review of Systems negative except from HPI and PMH  Physical Exam BP 136/86  Pulse 90  Ht 6\' 5"  (1.956 m)  Wt 204 lb (92.534 kg)  BMI 24.19 kg/m2 Well developed and well nourished in no acute distress HENT normal E scleral and icterus clear Neck Supple JVP flat; carotids brisk and full Clear to ausculation  *Regular rate and rhythm, no murmurs gallops or rub Soft with active bowel sounds No clubbing cyanosis none Edema Alert and oriented, grossly normal motor and sensory function Skin Warm and Dry    Assessment and  Plan

## 2012-10-08 NOTE — Patient Instructions (Addendum)
Your physician has recommended you make the following change in your medication: 1) Start Losartan 25 mg daily  Your physician recommends that you return for lab work in: 2 weeks for [facility]  Your physician wants you to follow-up in: 6 months with Dr. Graciela Husbands.  You will receive a reminder letter in the mail two months in advance. If you don't receive a letter, please call our office to schedule the follow-up appointment.

## 2012-10-08 NOTE — Assessment & Plan Note (Addendum)
Patient with recurrent treated ventricular tachycardia. On one occasion 3 ACPs were required. We'll reprogram the device to increase the number of ATP as well as take a little bit more aggressive. We've increased NID from  36--40.  We discussed other treatment strategies. We wait and see whether there is any impact on ARB therapy in the frequency of the ventricular tachycardia. I don't think sotalol will work because of Raynaud's; however, we might be able to use mexiletine. Catheter ablation has been reviewed in the past

## 2012-10-22 ENCOUNTER — Other Ambulatory Visit: Payer: 59

## 2012-12-03 ENCOUNTER — Ambulatory Visit (INDEPENDENT_AMBULATORY_CARE_PROVIDER_SITE_OTHER): Payer: 59 | Admitting: Physician Assistant

## 2012-12-03 VITALS — BP 122/78 | HR 87 | Temp 98.3°F | Resp 17 | Ht 76.5 in | Wt 201.0 lb

## 2012-12-03 DIAGNOSIS — J209 Acute bronchitis, unspecified: Secondary | ICD-10-CM

## 2012-12-03 DIAGNOSIS — R05 Cough: Secondary | ICD-10-CM

## 2012-12-03 DIAGNOSIS — R059 Cough, unspecified: Secondary | ICD-10-CM

## 2012-12-03 MED ORDER — HYDROCOD POLST-CHLORPHEN POLST 10-8 MG/5ML PO LQCR: 5.0000 mL | Freq: Two times a day (BID) | ORAL | Status: DC | PRN

## 2012-12-03 MED ORDER — AZITHROMYCIN 250 MG PO TABS: ORAL_TABLET | ORAL | Status: DC

## 2012-12-03 MED ORDER — METHYLPREDNISOLONE (PAK) 4 MG PO TABS: ORAL_TABLET | ORAL | Status: DC

## 2012-12-03 NOTE — Progress Notes (Signed)
   Subjective:    Patient ID: [name], male    DOB: 03-Jul-1970, 42 y.o.   MRN: [id number]  Cough Associated symptoms include postnasal drip and rhinorrhea. Pertinent negatives include no chest pain, chills, ear pain, fever, headaches, sore throat, shortness of breath or wheezing.   42 year old male presents for evaluation of nasal congestion, PND, and cough.  Admits the cough is productive of green/yellow sputum - worse at night and in the morning. Symptoms have been progressively worsening over the past 4 weeks.  Denies fever, chills, nausea, vomiting, headache, otalgia, sore throat, sinus pain, or abdominal pain.  Does have hx of allergies and mild asthma. Takes Claritin daily as well as uses albuterol and netty pot prn.  Has been using his inhaler more frequently over the last month.   Patient is otherwise doing well with no other concerns today.     Review of Systems  Constitutional: Negative for fever and chills.  HENT: Positive for congestion, postnasal drip, rhinorrhea and sinus pressure. Negative for ear pain and sore throat.   Respiratory: Positive for cough. Negative for chest tightness, shortness of breath and wheezing.   Cardiovascular: Negative for chest pain.  Gastrointestinal: Negative for nausea, vomiting and abdominal pain.  Neurological: Negative for dizziness and headaches.       Objective:   Physical Exam  Constitutional: He is oriented to person, place, and time. He appears well-developed and well-nourished.  HENT:  Head: Normocephalic and atraumatic.  Right Ear: Hearing, tympanic membrane, external ear and ear canal normal.  Left Ear: Hearing, tympanic membrane, external ear and ear canal normal.  Nose: Right sinus exhibits no maxillary sinus tenderness and no frontal sinus tenderness. Left sinus exhibits no maxillary sinus tenderness and no frontal sinus tenderness.  Mouth/Throat: Uvula is midline, oropharynx is clear and moist and mucous membranes are  normal.  Eyes: Conjunctivae are normal.  Neck: Normal range of motion. Neck supple.  Cardiovascular: Normal rate, regular rhythm and normal heart sounds.   Pulmonary/Chest: Effort normal and breath sounds normal.  Lymphadenopathy:    He has no cervical adenopathy.  Neurological: He is alert and oriented to person, place, and time.  Psychiatric: He has a normal mood and affect. His behavior is normal. Judgment and thought content normal.          Assessment & Plan:  Cough - Plan: chlorpheniramine-HYDROcodone (TUSSIONEX PENNKINETIC ER) 10-8 MG/5ML LQCR  Acute bronchitis - Plan: azithromycin (ZITHROMAX) 250 MG tablet, methylPREDNIsolone (MEDROL DOSPACK) 4 MG tablet  Will treat acute bronchitis with Zpack as directed Medrol dose pack to help with RAD Tussionex qhs prn cough - caution sedation Increase fluids and rest. Continue albuterol as needed for wheezing Follow up if symptoms worsen or fail to improve.

## 2013-01-04 ENCOUNTER — Other Ambulatory Visit: Payer: Self-pay | Admitting: Internal Medicine

## 2013-01-09 ENCOUNTER — Encounter: Payer: 59 | Admitting: *Deleted

## 2013-01-10 ENCOUNTER — Telehealth: Payer: Self-pay | Admitting: *Deleted

## 2013-01-10 NOTE — Telephone Encounter (Addendum)
Second attempt to reach patient about lab work (BMP) that he did not have done back in October 2014. (Checking his renal function after starting an ARB) Left another message.

## 2013-01-11 ENCOUNTER — Other Ambulatory Visit: Payer: Self-pay | Admitting: Internal Medicine

## 2013-01-14 ENCOUNTER — Other Ambulatory Visit: Payer: Self-pay | Admitting: *Deleted

## 2013-01-14 NOTE — Telephone Encounter (Signed)
Follow Up:     Pt is returning Sherri's call. 

## 2013-01-14 NOTE — Telephone Encounter (Signed)
Pt stated that he forgot all about Dr. Caryl Comes wanting that lab, and was apologetic. Pt scheduled to f/u BMP to assess kidney function tomorrow. Pt agreeable to plan.

## 2013-01-15 ENCOUNTER — Other Ambulatory Visit (INDEPENDENT_AMBULATORY_CARE_PROVIDER_SITE_OTHER): Payer: 59

## 2013-01-15 DIAGNOSIS — I429 Cardiomyopathy, unspecified: Secondary | ICD-10-CM

## 2013-01-15 DIAGNOSIS — I472 Ventricular tachycardia: Secondary | ICD-10-CM

## 2013-01-15 DIAGNOSIS — Z95810 Presence of automatic (implantable) cardiac defibrillator: Secondary | ICD-10-CM

## 2013-01-15 DIAGNOSIS — I4729 Other ventricular tachycardia: Secondary | ICD-10-CM

## 2013-01-16 ENCOUNTER — Encounter: Payer: Self-pay | Admitting: *Deleted

## 2013-01-16 LAB — BASIC METABOLIC PANEL
BUN: 22 mg/dL (ref 6–23)
CO2: 28 mEq/L (ref 19–32)
Calcium: 8.9 mg/dL (ref 8.4–10.5)
Chloride: 103 mEq/L (ref 96–112)
Creatinine, Ser: 1.3 mg/dL (ref 0.4–1.5)
GFR: 61.57 mL/min (ref >60.00)
Glucose, Bld: 100 mg/dL — ABNORMAL HIGH (ref 70–99)
Potassium: 3.9 mEq/L (ref 3.5–5.1)
Sodium: 137 mEq/L (ref 135–145)

## 2013-04-05 ENCOUNTER — Other Ambulatory Visit: Payer: Self-pay | Admitting: Internal Medicine

## 2013-04-23 ENCOUNTER — Telehealth: Payer: Self-pay | Admitting: Internal Medicine

## 2013-04-23 NOTE — Telephone Encounter (Signed)
Left message for patient to transmit via [facility].

## 2013-04-23 NOTE — Telephone Encounter (Signed)
New Prob    Pt had a syncopal episode yesterday and is doing much better now. Wife would like to know if it would be beneficial for him to transmit again or wait until next appointment. Please call.

## 2013-04-26 ENCOUNTER — Encounter: Payer: Self-pay | Admitting: Internal Medicine

## 2013-04-26 ENCOUNTER — Ambulatory Visit (INDEPENDENT_AMBULATORY_CARE_PROVIDER_SITE_OTHER): Payer: 59 | Admitting: *Deleted

## 2013-04-26 DIAGNOSIS — I472 Ventricular tachycardia: Secondary | ICD-10-CM

## 2013-04-26 DIAGNOSIS — I4729 Other ventricular tachycardia: Secondary | ICD-10-CM

## 2013-04-26 DIAGNOSIS — I429 Cardiomyopathy, unspecified: Secondary | ICD-10-CM

## 2013-04-26 DIAGNOSIS — I509 Heart failure, unspecified: Secondary | ICD-10-CM

## 2013-04-26 NOTE — Telephone Encounter (Signed)
Patient checked in office.

## 2013-04-29 LAB — MDC_IDC_ENUM_SESS_TYPE_INCLINIC
Battery Voltage: 3.12 V
Brady Statistic RV Percent Paced: 0.01 %
Date Time Interrogation Session: 20150424121615
HighPow Impedance: 19 Ohm
HighPow Impedance: 399 Ohm
HighPow Impedance: 46 Ohm
HighPow Impedance: 55 Ohm
Lead Channel Impedance Value: 399 Ohm
Lead Channel Pacing Threshold Amplitude: 0.75 V
Lead Channel Pacing Threshold Pulse Width: 0.4 ms
Lead Channel Sensing Intrinsic Amplitude: 12.25 mV
Lead Channel Sensing Intrinsic Amplitude: 9.75 mV
Lead Channel Setting Pacing Amplitude: 2.5 V
Lead Channel Setting Pacing Pulse Width: 0.4 ms
Lead Channel Setting Sensing Sensitivity: 0.45 mV
Zone Setting Detection Interval: 270 ms
Zone Setting Detection Interval: 330 ms
Zone Setting Detection Interval: 400 ms

## 2013-04-29 NOTE — Progress Notes (Signed)
ICD check in clinic for syncopal episode on 4/20. Normal device function. Threshold and sensing consistent with previous device measurements. Impedance trends stable over time. 12 NSVT episodes---max dur. 17 sec, Max V 226. 4 monitored VT episodes---max dur. 62 sec, Max V 165. 4/20 NSVT episode correlated with syncopal episode + Coreg 6.25mg  BID. Histogram distribution appropriate for patient and level of activity. No changes made this session. Device programmed at appropriate safety margins. Device programmed to optimize intrinsic conduction. Batt voltage 3.12V (RRT 2.63V). Pt enrolled in remote follow-up. Plan to follow up with SK first available (recall 04-2013).

## 2013-05-02 ENCOUNTER — Encounter: Payer: 59 | Admitting: Internal Medicine

## 2013-05-03 ENCOUNTER — Ambulatory Visit (INDEPENDENT_AMBULATORY_CARE_PROVIDER_SITE_OTHER): Payer: 59 | Admitting: Internal Medicine

## 2013-05-03 ENCOUNTER — Encounter: Payer: Self-pay | Admitting: Internal Medicine

## 2013-05-03 VITALS — BP 110/65 | HR 67 | Ht 77.0 in | Wt 207.0 lb

## 2013-05-03 DIAGNOSIS — I4729 Other ventricular tachycardia: Secondary | ICD-10-CM

## 2013-05-03 DIAGNOSIS — I472 Ventricular tachycardia: Secondary | ICD-10-CM

## 2013-05-03 DIAGNOSIS — I429 Cardiomyopathy, unspecified: Secondary | ICD-10-CM

## 2013-05-03 DIAGNOSIS — Z95810 Presence of automatic (implantable) cardiac defibrillator: Secondary | ICD-10-CM

## 2013-05-03 LAB — MDC_IDC_ENUM_SESS_TYPE_INCLINIC
Battery Voltage: 3.12 V
Brady Statistic RV Percent Paced: 0.01 %
Date Time Interrogation Session: 20150501164040
HighPow Impedance: 19 Ohm
HighPow Impedance: 399 Ohm
HighPow Impedance: 47 Ohm
HighPow Impedance: 62 Ohm
Lead Channel Impedance Value: 399 Ohm
Lead Channel Pacing Threshold Amplitude: 0.875 V
Lead Channel Pacing Threshold Pulse Width: 0.4 ms
Lead Channel Sensing Intrinsic Amplitude: 8.75 mV
Lead Channel Sensing Intrinsic Amplitude: 8.75 mV
Lead Channel Setting Pacing Amplitude: 2.5 V
Lead Channel Setting Pacing Pulse Width: 0.4 ms
Lead Channel Setting Sensing Sensitivity: 0.45 mV
Zone Setting Detection Interval: 270 ms
Zone Setting Detection Interval: 350 ms
Zone Setting Detection Interval: 400 ms

## 2013-05-03 NOTE — Patient Instructions (Signed)
Your physician recommends that you continue on your current medications as directed. Please refer to the Current Medication list given to you today.  Your physician recommends that you schedule a follow-up appointment with Dr. Rayann Heman for VT ablation evaluation.

## 2013-05-03 NOTE — Progress Notes (Signed)
Patient has no care team.   HPI  [name] is a 43 y.o. male Seen in followup for ICD implanted for secondary prevention.  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 Most recent ejection fraction was 25% or so.   he has had a history of symptomatic sustained ventricular tachycardia. Discussions were had concerning catheter ablation. It was elected to use sotalol but that was never initiated. He has had recurrent prolonged nonsustained ventricular tachycardia; his device has been programmed to minimize therapy for this.   He had recent syncope assoc with abrupt change in position; interrogation of his device demonstrated that there was a cooccuring episode of nonsustained ventricular tachycardia occurring mostly just below his rate detection cutoff.  But prior detection has shown events of VT at the cutoff rate resulting in prolonged events prior to therapy. There may have been nonsustained event on the day of his syncope        Past Medical History  Diagnosis Date  . Systolic congestive heart failure     With ejection fraction of 25%, normal coronaries on Cath 02/2010  . Hypertension   . Syncope     episode occured Feb 2011, ICD placed  . 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. Asthma   . ADHD (attention deficit hyperactivity disorder)   . ETOH abuse   . Nonischemic cardiomyopathy   . Ventricular tachycardia   . ICD (implantable cardioverter-defibrillator), single- medtronic     Past Surgical History  Procedure Laterality Date  . Orchiectomy      with abdominal lymph node dissection  . Wrist fracture surgery      Current Outpatient Prescriptions  Medication Sig Dispense Refill  . aspirin 81 MG tablet Take 81 mg by mouth daily.        . carvedilol (COREG) 12.5 MG tablet TAKE 1/2 TABLET BY MOUTH TWICE A DAY WITH A MEAL.      . fish oil-omega-3 fatty acids 1000 MG capsule Take 1 g by mouth daily.       . Glucosamine-Chondroit-Vit C-Mn  (GLUCOSAMINE 1500 COMPLEX PO) Take 1 tablet by mouth daily.        . Lisdexamfetamine Dimesylate (VYVANSE PO) Take 1 tablet by mouth daily. Pt unsure of strength (05/03/13)      . LORazepam (ATIVAN) 0.5 MG tablet Take 0.5 mg by mouth as needed for anxiety.      Marland Kitchen losartan (COZAAR) 25 MG tablet Take 1 tablet (25 mg total) by mouth daily.  90 tablet  3  . sildenafil (VIAGRA) 50 MG tablet Take 1 tablet (50 mg total) by mouth daily as needed for erectile dysfunction.  10 tablet  2  . spironolactone (ALDACTONE) 25 MG tablet TAKE 1/2 TABLET BY MOUTH DAILY *NEED LABS*  45 tablet  0  . albuterol (PROVENTIL HFA;VENTOLIN HFA) 108 (90 BASE) MCG/ACT inhaler Inhale 2 puffs into the lungs 4 (four) times daily as needed for wheezing.  1 Inhaler  5   No current facility-administered medications for this visit.    No Known Allergies  Review of Systems negative except from HPI and PMH  Physical Exam BP 110/65  Pulse 67  Ht 6\' 5"  (1.956 m)  Wt 207 lb (93.895 kg)  BMI 24.54 kg/m2 Well developed and well nourished in no acute distress HENT normal E scleral and icterus clear Neck Supple JVP flat; carotids brisk and full Clear to ausculation  Regular rate and rhythm, no murmurs gallops or rub  Soft with active bowel sounds No clubbing cyanosis  Edema Alert and oriented, grossly normal motor and sensory function Skin Warm and Dry    Assessment and  Plan  Nonischemic cardiomyopathy  Ventricular tachycardia  Syncope associated with the above  Orthostatic lightheadedness  Implantable defibrillator-Medtronic  Is noted that he had ventricular tachycardia associated with his syncope. This is the same morphology that he has had multiple times before. Previously he's been averse to either the use of antiarrhythmic therapy or catheter ablation. Given the severity of the associated symptoms he is not opening consider catheter ablation he would like to pursue this option as opposed to long-term  antiarrhythmic therapy. I have told him that there are concerns about the effectiveness of catheter ablation and nonischemic cardio myopathy like to have reviewed this option with Dr. Greggory Brandy. After their discussion they may decide to pursue antiarrhythmic therapy  I have reprogrammed his device to allow for earlier therapy both by decreasing the detection rate and by decreasing the NID to hopefully avoid syncope.  He is aware of driving restrictions

## 2013-05-09 ENCOUNTER — Other Ambulatory Visit: Payer: Self-pay | Admitting: Internal Medicine

## 2013-06-26 ENCOUNTER — Other Ambulatory Visit: Payer: Self-pay | Admitting: Internal Medicine

## 2013-08-19 ENCOUNTER — Ambulatory Visit (INDEPENDENT_AMBULATORY_CARE_PROVIDER_SITE_OTHER): Payer: 59 | Admitting: *Deleted

## 2013-08-19 DIAGNOSIS — I4729 Other ventricular tachycardia: Secondary | ICD-10-CM

## 2013-08-19 DIAGNOSIS — I472 Ventricular tachycardia, unspecified: Secondary | ICD-10-CM

## 2013-08-19 DIAGNOSIS — I509 Heart failure, unspecified: Secondary | ICD-10-CM

## 2013-08-19 LAB — MDC_IDC_ENUM_SESS_TYPE_INCLINIC
Battery Voltage: 3.03 V
Brady Statistic RV Percent Paced: 0.01 %
Date Time Interrogation Session: 20150817122516
HighPow Impedance: 19 Ohm
HighPow Impedance: 418 Ohm
HighPow Impedance: 47 Ohm
HighPow Impedance: 61 Ohm
Lead Channel Impedance Value: 418 Ohm
Lead Channel Pacing Threshold Amplitude: 0.75 V
Lead Channel Pacing Threshold Pulse Width: 0.4 ms
Lead Channel Sensing Intrinsic Amplitude: 11.5 mV
Lead Channel Sensing Intrinsic Amplitude: 9.125 mV
Lead Channel Setting Pacing Amplitude: 2.5 V
Lead Channel Setting Pacing Pulse Width: 0.4 ms
Lead Channel Setting Sensing Sensitivity: 0.45 mV
Zone Setting Detection Interval: 270 ms
Zone Setting Detection Interval: 350 ms
Zone Setting Detection Interval: 400 ms

## 2013-08-19 NOTE — Progress Notes (Signed)
ICD check in clinic for episode resulting in shock on 08-18-13 in the evening. Pt was having palpitations that started around 1515 and lasted for 40 minutes. Around 1550, pt sat down because it was felt the palpitations were getting worse. Episode strip showed cycle length around 210 to 300. Device gave 2 ATP therapies ( with no success) 24.6 shock that resulted in VF with cycle length around 120 to 170. 34.9 J shock broke rhythm. Pt was aware of one shock but not the other. Pt may think he briefly passed out for seconds. Pt was instructed no driving x 6 mths due to episode. 416 other nst episodes and 5 monitored VT episodes recorded. Strips will be reviewed with SK on 8-18 and pt will be called with any changes to be made.

## 2013-08-20 ENCOUNTER — Ambulatory Visit (INDEPENDENT_AMBULATORY_CARE_PROVIDER_SITE_OTHER): Payer: 59 | Admitting: *Deleted

## 2013-08-20 DIAGNOSIS — I428 Other cardiomyopathies: Secondary | ICD-10-CM

## 2013-08-20 LAB — MDC_IDC_ENUM_SESS_TYPE_INCLINIC
Lead Channel Setting Pacing Amplitude: 2.5 V
Lead Channel Setting Pacing Pulse Width: 0.4 ms
Lead Channel Setting Sensing Sensitivity: 0.45 mV
Zone Setting Detection Interval: 270 ms
Zone Setting Detection Interval: 350 ms
Zone Setting Detection Interval: 400 ms

## 2013-08-20 NOTE — Progress Notes (Signed)
Changes to device as follows per SK: VF Rx 1 energy shock changed from 25 J to 35 J shock. VF Rx 4 pathway B>AX, VT Rx 3  energy from 20 J to 35 J, and VT Rx 6 pathway from AX>B to B>AX. Med changes as follows: Coreg 1 tablet bid, Aldactone 25 mg 1/2 tablet qod and Losartan 25 mg 1 tablet qod (opposite Aldactone).  ROV 09-25-13 @ 1600 with SK.

## 2013-08-22 ENCOUNTER — Encounter: Payer: Self-pay | Admitting: Internal Medicine

## 2013-09-05 ENCOUNTER — Encounter: Payer: Self-pay | Admitting: Internal Medicine

## 2013-09-25 ENCOUNTER — Encounter: Payer: 59 | Admitting: Internal Medicine

## 2013-09-25 ENCOUNTER — Telehealth: Payer: Self-pay | Admitting: Internal Medicine

## 2013-09-25 ENCOUNTER — Encounter: Payer: Self-pay | Admitting: Internal Medicine

## 2013-09-25 NOTE — Telephone Encounter (Signed)
[name], EP scheduler attempted to reach patient.  She left a message offering f/u appt on 10/6 at 1:30.

## 2013-09-25 NOTE — Telephone Encounter (Signed)
New problem    Pt was unable to come to appt b/c he was at work and want to speak to nurse so he can be worked in. Please call pt.

## 2013-10-08 ENCOUNTER — Encounter: Payer: Self-pay | Admitting: Internal Medicine

## 2013-10-08 ENCOUNTER — Ambulatory Visit (INDEPENDENT_AMBULATORY_CARE_PROVIDER_SITE_OTHER): Payer: 59 | Admitting: Internal Medicine

## 2013-10-08 VITALS — BP 92/60 | HR 72 | Ht 77.0 in | Wt 209.2 lb

## 2013-10-08 DIAGNOSIS — I5020 Unspecified systolic (congestive) heart failure: Secondary | ICD-10-CM

## 2013-10-08 DIAGNOSIS — I472 Ventricular tachycardia: Secondary | ICD-10-CM

## 2013-10-08 DIAGNOSIS — Z95810 Presence of automatic (implantable) cardiac defibrillator: Secondary | ICD-10-CM

## 2013-10-08 DIAGNOSIS — I952 Hypotension due to drugs: Secondary | ICD-10-CM

## 2013-10-08 DIAGNOSIS — I509 Heart failure, unspecified: Secondary | ICD-10-CM

## 2013-10-08 DIAGNOSIS — I429 Cardiomyopathy, unspecified: Secondary | ICD-10-CM

## 2013-10-08 DIAGNOSIS — Z4502 Encounter for adjustment and management of automatic implantable cardiac defibrillator: Secondary | ICD-10-CM

## 2013-10-08 DIAGNOSIS — I4729 Other ventricular tachycardia: Secondary | ICD-10-CM

## 2013-10-08 DIAGNOSIS — I428 Other cardiomyopathies: Secondary | ICD-10-CM

## 2013-10-08 LAB — MDC_IDC_ENUM_SESS_TYPE_INCLINIC
Battery Voltage: 3.12 V
Brady Statistic RV Percent Paced: 0.01 %
Date Time Interrogation Session: 20151006150735
HighPow Impedance: 19 Ohm
HighPow Impedance: 418 Ohm
HighPow Impedance: 44 Ohm
HighPow Impedance: 56 Ohm
Lead Channel Impedance Value: 399 Ohm
Lead Channel Pacing Threshold Amplitude: 0.75 V
Lead Channel Pacing Threshold Pulse Width: 0.4 ms
Lead Channel Sensing Intrinsic Amplitude: 10.5 mV
Lead Channel Sensing Intrinsic Amplitude: 12.5 mV
Lead Channel Setting Pacing Amplitude: 2.5 V
Lead Channel Setting Pacing Pulse Width: 0.4 ms
Lead Channel Setting Sensing Sensitivity: 0.45 mV
Zone Setting Detection Interval: 230 ms
Zone Setting Detection Interval: 270 ms
Zone Setting Detection Interval: 350 ms
Zone Setting Detection Interval: 400 ms

## 2013-10-08 NOTE — Progress Notes (Signed)
Patient has no care team.   HPI  [name] is a 43 y.o. male Seen in followup for ICD implanted for secondary prevention.  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 Most recent ejection fraction was 25% or so.   he has had a history of symptomatic sustained ventricular tachycardia. Discussions were had concerning catheter ablation. It was elected to use sotalol but that was never initiated. He has had recurrent prolonged nonsustained ventricular tachycardia; his device has been programmed to minimize therapy for this.   He ended up with ICD shock 8/15  ATP failed to terminate VT, 24J shock failed and 35 joule shock was successful  There was associated syncope  Functionally quite stable             Past Medical History  Diagnosis Date  . Systolic congestive heart failure     With ejection fraction of 25%, normal coronaries on Cath 02/2010  . Hypertension   . Syncope     episode occured Feb 2011, ICD placed  . 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. Asthma   . ADHD (attention deficit hyperactivity disorder)   . ETOH abuse   . Nonischemic cardiomyopathy   . Ventricular tachycardia   . ICD (implantable cardioverter-defibrillator), single- medtronic     Past Surgical History  Procedure Laterality Date  . Orchiectomy      with abdominal lymph node dissection  . Wrist fracture surgery      Current Outpatient Prescriptions  Medication Sig Dispense Refill  . albuterol (PROVENTIL HFA;VENTOLIN HFA) 108 (90 BASE) MCG/ACT inhaler Inhale 2 puffs into the lungs 4 (four) times daily as needed for wheezing.  1 Inhaler  5  . aspirin 81 MG tablet Take 81 mg by mouth daily.        . carvedilol (COREG) 12.5 MG tablet Take 1 tablet twice daily with meals      . fish oil-omega-3 fatty acids 1000 MG capsule Take 1 g by mouth daily.       . Glucosamine-Chondroit-Vit C-Mn (GLUCOSAMINE 1500 COMPLEX PO) Take 1 tablet by mouth daily.        .  Lisdexamfetamine Dimesylate (VYVANSE PO) Take 1 tablet by mouth daily. Pt unsure of strength (05/03/13)      . LORazepam (ATIVAN) 0.5 MG tablet Take 0.5 mg by mouth as needed for anxiety.      Marland Kitchen losartan (COZAAR) 25 MG tablet Take 25 mg by mouth every other day.      . sildenafil (VIAGRA) 50 MG tablet Take 1 tablet (50 mg total) by mouth daily as needed for erectile dysfunction.  10 tablet  2  . spironolactone (ALDACTONE) 25 MG tablet Take 12.5 mg by mouth every other day.      . Turmeric 500 MG CAPS Take 1,500 tablets by mouth daily.       No current facility-administered medications for this visit.    No Known Allergies  Review of Systems negative except from HPI and PMH  Physical Exam There were no vitals taken for this visit. Well developed and well nourished in no acute distress HENT normal E scleral and icterus clear Neck Supple JVP flat; carotids brisk and full Clear to ausculation  Regular rate and rhythm, no murmurs gallops or rub Soft with active bowel sounds No clubbing cyanosis  Edema Alert and oriented, grossly normal motor and sensory function Skin Warm and Dry    Assessment and  Plan  Nonischemic cardiomyopathy  Ventricular tachycardia  Syncope associated with the above  Orthostatic lightheadedness  Implantable defibrillator-Medtronic  He comes in today with requests to be more proactive in regard to treatment of his ventricular tachycardia. We will begin by referring her to the heart failure clinic. I will also look into regional Center for ventricular tachycardia ablation at the past had epicardial access

## 2013-10-08 NOTE — Patient Instructions (Addendum)
Your physician recommends that you have lab work today: BMP/ Magnesium  Your physician wants you to follow-up in: 3 months with Dr. Caryl Comes. You will receive a reminder letter in the mail two months in advance. If you don't receive a letter, please call our office to schedule the follow-up appointment.  You have been referred to : [facility]  Your physician recommends that you continue on your current medications as directed. Please refer to the Current Medication list given to you today.

## 2013-10-22 ENCOUNTER — Telehealth: Payer: Self-pay | Admitting: *Deleted

## 2013-10-22 NOTE — Telephone Encounter (Signed)
Left message for patient to call me back. Need to schedule BMET/Magnesium lab draw (orders already in computer)  (it was supposed to be done at 10/6 OV, but was not)

## 2013-10-28 ENCOUNTER — Ambulatory Visit (HOSPITAL_COMMUNITY)
Admission: RE | Admit: 2013-10-28 | Discharge: 2013-10-28 | Disposition: A | Discharge status: Home / Self Care | Payer: 59 | Source: Ambulatory Visit | Attending: Cardiology | Admitting: Cardiology

## 2013-10-28 ENCOUNTER — Encounter (HOSPITAL_COMMUNITY): Payer: Self-pay

## 2013-10-28 VITALS — BP 126/80 | HR 72 | Ht 77.0 in | Wt 210.1 lb

## 2013-10-28 DIAGNOSIS — I5022 Chronic systolic (congestive) heart failure: Secondary | ICD-10-CM | POA: Insufficient documentation

## 2013-10-28 DIAGNOSIS — I447 Left bundle-branch block, unspecified: Secondary | ICD-10-CM | POA: Diagnosis not present

## 2013-10-28 DIAGNOSIS — I429 Cardiomyopathy, unspecified: Secondary | ICD-10-CM | POA: Diagnosis not present

## 2013-10-28 DIAGNOSIS — Z9221 Personal history of antineoplastic chemotherapy: Secondary | ICD-10-CM | POA: Diagnosis not present

## 2013-10-28 DIAGNOSIS — Z79899 Other long term (current) drug therapy: Secondary | ICD-10-CM | POA: Diagnosis not present

## 2013-10-28 DIAGNOSIS — Z8547 Personal history of malignant neoplasm of testis: Secondary | ICD-10-CM | POA: Insufficient documentation

## 2013-10-28 DIAGNOSIS — Z7982 Long term (current) use of aspirin: Secondary | ICD-10-CM | POA: Insufficient documentation

## 2013-10-28 DIAGNOSIS — I472 Ventricular tachycardia: Secondary | ICD-10-CM | POA: Diagnosis not present

## 2013-10-28 DIAGNOSIS — F1020 Alcohol dependence, uncomplicated: Secondary | ICD-10-CM | POA: Insufficient documentation

## 2013-10-28 DIAGNOSIS — I4729 Other ventricular tachycardia: Secondary | ICD-10-CM

## 2013-10-28 DIAGNOSIS — F988 Other specified behavioral and emotional disorders with onset usually occurring in childhood and adolescence: Secondary | ICD-10-CM | POA: Insufficient documentation

## 2013-10-28 LAB — BASIC METABOLIC PANEL
Anion gap: 13 (ref 5–15)
BUN: 13 mg/dL (ref 6–23)
CO2: 27 mEq/L (ref 19–32)
Calcium: 9.6 mg/dL (ref 8.4–10.5)
Chloride: 99 mEq/L (ref 96–112)
Creatinine, Ser: 1.07 mg/dL (ref 0.50–1.35)
GFR calc Af Amer: >90 mL/min (ref >90)
GFR calc non Af Amer: 83 mL/min — ABNORMAL LOW (ref >90)
Glucose, Bld: 92 mg/dL (ref 70–99)
Potassium: 4.9 mEq/L (ref 3.7–5.3)
Sodium: 139 mEq/L (ref 137–147)

## 2013-10-28 LAB — PRO B NATRIURETIC PEPTIDE: Pro B Natriuretic peptide (BNP): 541.4 pg/mL — ABNORMAL HIGH (ref 0–125)

## 2013-10-28 MED ORDER — SACUBITRIL-VALSARTAN 24-26 MG PO TABS: 24.0000 mg | ORAL_TABLET | Freq: Two times a day (BID) | ORAL | Status: DC

## 2013-10-28 NOTE — Patient Instructions (Signed)
STOP Losartan and Spironolactone START Entresto 24/26mg  twice a day  Labs today and again in two weeks (BMET)  Your physician recommends that you schedule a follow-up appointment in: 1 months  Do the following things EVERYDAY: 1) Weigh yourself in the morning before breakfast. Write it down and keep it in a log. 2) Take your medicines as prescribed 3) Eat low salt foods-Limit salt (sodium) to 2000 mg per day.  4) Stay as active as you can everyday 5) Limit all fluids for the day to less than 2 liters 6)

## 2013-10-28 NOTE — Progress Notes (Signed)
Patient ID: [name], male   DOB: 1970-11-11, 43 y.o.   MRN: [id number] EP: Dr. Caryl Comes  43 yo with history of nonischemic cardiomyopathy, VT s/p ICD discharges, and ADD presents for CHF clinic evaluation.  Patient has been followed by Dr Caryl Comes.  He has had a nonischemic cardiomyopathy known for at least 4-5 years now.  Coronary angiography in 3/12 showed no significant disease.  Last echo in 2/13 showed EF 25-30% with global hypokinesis.  He had testicular cancer treated with chemotherapy in the 1990s but does not seem to have had a cardiotoxic regimen.  He was a relatively heavy drinker in the past but abstinence from ETOH for a year did not improve his EF.  He now drinks moderately, typically 1-2 drinks/day.  He had an episode of VT with syncope and successful ICD discharge in 8/15.  He saw Dr Caryl Comes after this and Coreg was increased.  No ICD discharge since that time.  He has ADD and is taking Vyvanse (has been medicated for this for a long time now).    He denies exertional dyspnea or chest pain.  Good exercise tolerance.  No orthopnea/PND.  No palpitations.  He has had problems with orthostatic symptoms in the past but but is doing fine now. When Coreg was recently increased, losartan and spironolactone were changed to every other day.   ECG: NSR, LBBB with QRS 150 msec  Labs (1/15): K 3.9, creatinine 1.3  PMH: 1. Nonischemic cardiomyopathy: Known for several years.  LHC (3/12) with normal coronaries.  Cardiac MRI (2/12) with EF 27%, normal RV size/systolic function, no myocardial delayed enhancement.  Echo (2/13) with EF 25-30%, global hypokinesis, RV normal systolic and systolic function.   2. H/o VT: Has Medtronic ICD.  8/15 episode of syncope due to VT with ICD discharge.  3. ADD 4. 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  Regimen not thought to have been cardiotoxic.   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   FH: Mother and sister had heart valve replacements.  ROS: All systems reviewed and negative except as per HPI.   Current Outpatient Prescriptions  Medication Sig Dispense Refill  . albuterol (PROVENTIL HFA;VENTOLIN HFA) 108 (90 BASE) MCG/ACT inhaler Inhale 2 puffs into the lungs 4 (four) times daily as needed for wheezing.  1 Inhaler  5  . aspirin 81 MG tablet Take 81 mg by mouth daily.        . carvedilol (COREG) 12.5 MG tablet Take 1 tablet twice daily with meals      . fish oil-omega-3 fatty acids 1000 MG capsule Take 1 g by mouth daily.       . Glucosamine-Chondroit-Vit C-Mn (GLUCOSAMINE 1500 COMPLEX PO) Take 1 tablet by mouth daily.        . Lisdexamfetamine Dimesylate (VYVANSE PO) Take 70 mg by mouth daily.       Marland Kitchen LORazepam (ATIVAN) 0.5 MG tablet Take 0.5 mg by mouth as needed for anxiety.      . sildenafil (VIAGRA) 50 MG tablet Take 1 tablet (50 mg total) by mouth daily as needed for erectile dysfunction.  10 tablet  2  . Turmeric 500 MG CAPS Take 1,500 tablets by mouth daily.      . Sacubitril-Valsartan (ENTRESTO) 24-26 MG TABS Take 24-26 mg by mouth 2 (two) times daily.  60 tablet  3   No current  facility-administered medications for this encounter.   BP 126/80  Pulse 72  Ht 6\' 5"  (1.956 m)  Wt 210 lb 1.9 oz (95.31 kg)  BMI 24.91 kg/m2  SpO2 100% General: NAD Neck: No JVD, no thyromegaly or thyroid nodule.  Lungs: Clear to auscultation bilaterally with normal respiratory effort. CV: Nondisplaced PMI.  Heart regular S1/S2, no S3/S4, no murmur.  No peripheral edema.  No carotid bruit.  Normal pedal pulses.  Abdomen: Soft, nontender, no hepatosplenomegaly, no distention.  Skin: Intact without lesions or rashes.  Neurologic: Alert and oriented x 3.  Psych: Normal affect. Extremities: No clubbing or cyanosis.   Assessment/Plan:  1. Nonischemic Cardiomyopathy: Last echo in 2/13 with EF 25-30%.  NYHA class I symptoms  currently.  No coronary disease on cath in 2012.  No delayed enhancment on MRI in 2012.  He had chemo for testicular cancer in 1990s but this does not appear to have been a cardiotoxic regimen.  He has been a moderate to heavy consumer of ETOH, but does not seem to have been to such an extent to explain all the cardiomyopathy, and EF did not improve with 1 year of ETOH abstinence.  No family history of cardiomyopathy.  Possible prior viral myocarditis. He looks euvolemic.  - ECG shows LBBB, last QRS width about 150 msec.  Would consider ICD upgrade to CRT, will discuss with Dr. Caryl Comes.  There does seem to be some benefit for NYHA class I patients. - Continue Coreg at current dose.  - No Lasix needed.  - He is taking low dose losartan and spironolactone every other day.  I will stop both of these for now and start Entresto 24/26 bid.  If he tolerates this with no orthostatic symptoms, will restart spironolactone at next appointment (but will give daily).  - BMET/BNP today and repeat in 2 wks.   2. VT: History of VT, most recently had syncope in 8/15 with ICD discharge.  Coreg was increased.  - He is on Vyvanse, which is a stimulant.  We talked about this, and he says that without it his quality of life is very poor.  - Dr Caryl Comes has been considering sotalol +/- VT ablation versus just watch on higher dose of Coreg.  Will consult with him on which approach he thinks would be best.   Followup in 1 month with me.  Loralie Champagne 10/28/2013

## 2013-11-08 ENCOUNTER — Other Ambulatory Visit: Payer: Self-pay | Admitting: Internal Medicine

## 2013-11-08 ENCOUNTER — Other Ambulatory Visit: Payer: Self-pay

## 2013-11-08 MED ORDER — CARVEDILOL 12.5 MG PO TABS: 12.5000 mg | ORAL_TABLET | Freq: Two times a day (BID) | ORAL | Status: DC

## 2013-11-08 NOTE — Telephone Encounter (Signed)
Patient having Magnesium lab done while at heart failure clinic on 11/10. Recent BMET looked normal

## 2013-11-12 ENCOUNTER — Ambulatory Visit (HOSPITAL_COMMUNITY)
Admission: RE | Admit: 2013-11-12 | Discharge: 2013-11-12 | Disposition: A | Discharge status: Home / Self Care | Payer: 59 | Source: Ambulatory Visit | Attending: Cardiology | Admitting: Cardiology

## 2013-11-12 DIAGNOSIS — I5022 Chronic systolic (congestive) heart failure: Secondary | ICD-10-CM | POA: Diagnosis not present

## 2013-11-12 LAB — BASIC METABOLIC PANEL
Anion gap: 15 (ref 5–15)
BUN: 20 mg/dL (ref 6–23)
CO2: 24 mEq/L (ref 19–32)
Calcium: 9.4 mg/dL (ref 8.4–10.5)
Chloride: 102 mEq/L (ref 96–112)
Creatinine, Ser: 1.11 mg/dL (ref 0.50–1.35)
GFR calc Af Amer: >90 mL/min (ref >90)
GFR calc non Af Amer: 79 mL/min — ABNORMAL LOW (ref >90)
Glucose, Bld: 87 mg/dL (ref 70–99)
Potassium: 4 mEq/L (ref 3.7–5.3)
Sodium: 141 mEq/L (ref 137–147)

## 2013-11-12 LAB — MAGNESIUM: Magnesium: 2.2 mg/dL (ref 1.5–2.5)

## 2013-11-12 LAB — PRO B NATRIURETIC PEPTIDE: Pro B Natriuretic peptide (BNP): 241.9 pg/mL — ABNORMAL HIGH (ref 0–125)

## 2013-12-03 ENCOUNTER — Ambulatory Visit (HOSPITAL_COMMUNITY)
Admission: RE | Admit: 2013-12-03 | Discharge: 2013-12-03 | Disposition: A | Discharge status: Home / Self Care | Payer: 59 | Source: Ambulatory Visit | Attending: Internal Medicine | Admitting: Internal Medicine

## 2013-12-03 ENCOUNTER — Encounter (HOSPITAL_COMMUNITY): Payer: Self-pay

## 2013-12-03 VITALS — BP 118/74 | HR 70 | Wt 211.8 lb

## 2013-12-03 DIAGNOSIS — I472 Ventricular tachycardia: Secondary | ICD-10-CM | POA: Diagnosis not present

## 2013-12-03 DIAGNOSIS — Z7982 Long term (current) use of aspirin: Secondary | ICD-10-CM | POA: Diagnosis not present

## 2013-12-03 DIAGNOSIS — Z9221 Personal history of antineoplastic chemotherapy: Secondary | ICD-10-CM | POA: Diagnosis not present

## 2013-12-03 DIAGNOSIS — I4729 Other ventricular tachycardia: Secondary | ICD-10-CM

## 2013-12-03 DIAGNOSIS — Z79899 Other long term (current) drug therapy: Secondary | ICD-10-CM | POA: Insufficient documentation

## 2013-12-03 DIAGNOSIS — I429 Cardiomyopathy, unspecified: Secondary | ICD-10-CM | POA: Diagnosis present

## 2013-12-03 DIAGNOSIS — I5022 Chronic systolic (congestive) heart failure: Secondary | ICD-10-CM

## 2013-12-03 DIAGNOSIS — R55 Syncope and collapse: Secondary | ICD-10-CM | POA: Diagnosis not present

## 2013-12-03 MED ORDER — SPIRONOLACTONE 25 MG PO TABS: 12.5000 mg | ORAL_TABLET | Freq: Every day | ORAL | Status: DC

## 2013-12-03 NOTE — Patient Instructions (Signed)
Start Spironolactone 12.5 mg (1/2 tablet) once daily.  Return to our clinic in 2 weeks for BMET.  We will schedule you for cardiac pulmonary stress test.  Follow up in 2 months with doctor.  Do the following things EVERYDAY: 1) Weigh yourself in the morning before breakfast. Write it down and keep it in a log. 2) Take your medicines as prescribed 3) Eat low salt foods-Limit salt (sodium) to 2000 mg per day.  4) Stay as active as you can everyday 5) Limit all fluids for the day to less than 2 liters

## 2013-12-03 NOTE — Progress Notes (Signed)
Patient ID: [name], male   DOB: 06-24-1970, 43 y.o.   MRN: [id number] EP: Dr. Caryl Comes  43 yo with history of nonischemic cardiomyopathy, VT s/p ICD discharges, and ADD presents for CHF clinic evaluation.  Patient has been followed by Dr Caryl Comes.  He has had a nonischemic cardiomyopathy known for at least 4-5 years now.  Coronary angiography in 3/12 showed no significant disease.  Last echo in 2/13 showed EF 25-30% with global hypokinesis.  He had testicular cancer treated with chemotherapy in the 1990s but does not seem to have had a cardiotoxic regimen.  He was a relatively heavy drinker in the past but abstinence from ETOH for a year did not improve his EF.  He now drinks moderately, typically 1-2 drinks/day.  He had an episode of VT with syncope and successful ICD discharge in 8/15.  He saw Dr Caryl Comes after this and Coreg was increased.  No ICD discharge since that time.  He has ADD and is taking Vyvanse (has been medicated for this for a long time now).    He denies exertional dyspnea or chest pain.  Good exercise tolerance.  No orthopnea/PND.  No palpitations.  He has had problems with orthostatic symptoms in the past but but is doing fine now. He tolerated the addition of Entresto at last appointment.   ECG: NSR, LBBB with QRS 150 msec  Labs (1/15): K 3.9, creatinine 1.3 Labs (11/15): K 4, creatinine 1.11, proBNP 242  PMH: 1. Nonischemic cardiomyopathy: Known for several years.  LHC (3/12) with normal coronaries.  Cardiac MRI (2/12) with EF 27%, normal RV size/systolic function, no myocardial delayed enhancement.  Echo (2/13) with EF 25-30%, global hypokinesis, RV normal systolic and systolic function.   2. H/o VT: Has Medtronic ICD.  8/15 episode of syncope due to VT with ICD discharge.  3. ADD 4. 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  Regimen not thought to have been cardiotoxic.   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   FH: Mother and sister had heart valve replacements.  ROS: All systems reviewed and negative except as per HPI.   Current Outpatient Prescriptions  Medication Sig Dispense Refill  . aspirin 81 MG tablet Take 81 mg by mouth daily.      . carvedilol (COREG) 12.5 MG tablet Take 1 tablet (12.5 mg total) by mouth 2 (two) times daily. 180 tablet 3  . fish oil-omega-3 fatty acids 1000 MG capsule Take 1 g by mouth daily.     . Glucosamine-Chondroit-Vit C-Mn (GLUCOSAMINE 1500 COMPLEX PO) Take 1 tablet by mouth daily.      . Lisdexamfetamine Dimesylate (VYVANSE PO) Take 70 mg by mouth daily.     Marland Kitchen LORazepam (ATIVAN) 0.5 MG tablet Take 0.5 mg by mouth as needed for anxiety.    . Sacubitril-Valsartan (ENTRESTO) 24-26 MG TABS Take 24-26 mg by mouth 2 (two) times daily. 60 tablet 3  . sildenafil (VIAGRA) 50 MG tablet Take 1 tablet (50 mg total) by mouth daily as needed for erectile dysfunction. 10 tablet 2  . Turmeric 500 MG CAPS Take 1,500 tablets by mouth daily.    Marland Kitchen albuterol (PROVENTIL HFA;VENTOLIN HFA) 108 (90 BASE) MCG/ACT inhaler Inhale 2 puffs into the lungs 4 (four) times daily as needed for wheezing. 1 Inhaler 5  . spironolactone (ALDACTONE) 25 MG tablet Take 0.5 tablets (12.5 mg total) by mouth  daily. 45 tablet 3   No current facility-administered medications for this encounter.   BP 118/74 mmHg  Pulse 70  Wt 211 lb 12.8 oz (96.072 kg)  SpO2 100% General: NAD Neck: No JVD, no thyromegaly or thyroid nodule.  Lungs: Clear to auscultation bilaterally with normal respiratory effort. CV: Nondisplaced PMI.  Heart regular S1/S2, no S3/S4, no murmur.  No peripheral edema.  No carotid bruit.  Normal pedal pulses.  Abdomen: Soft, nontender, no hepatosplenomegaly, no distention.  Skin: Intact without lesions or rashes.  Neurologic: Alert and oriented x 3.  Psych: Normal affect. Extremities: No clubbing or cyanosis.   Assessment/Plan:  1.  Nonischemic Cardiomyopathy: Last echo in 2/13 with EF 25-30%.  NYHA class I symptoms currently.  No coronary disease on cath in 2012.  No delayed enhancment on MRI in 2012.  He had chemo for testicular cancer in 1990s but this does not appear to have been a cardiotoxic regimen.  He has been a moderate to heavy consumer of ETOH, but does not seem to have been to such an extent to explain all the cardiomyopathy, and EF did not improve with 1 year of ETOH abstinence.  No family history of cardiomyopathy.  Possible prior viral myocarditis. He looks euvolemic.  - ECG shows LBBB, last QRS width about 150 msec.  Currently describes NYHA class I symptoms, so not a definite candidate for CRT upgrade.  However, I will arrange for CPX.  If his functional capacity is low compared to age-matched normals, would favor CRT upgrage.  - Continue Coreg and Entresto at current doses.  - No Lasix needed.  - I will restart him on spironolactone 12.5 mg daily today.  He will need a BMET in 2 wks.   2. VT: History of VT, most recently had syncope in 8/15 with ICD discharge.  Coreg was increased.  - He is on Vyvanse, which is a stimulant.  We talked about this, and he says that without it his quality of life is very poor.  - Dr Caryl Comes has been considering sotalol +/- VT ablation versus just watch on higher dose of Coreg.    Followup in 4 months.  If functional capacity below average on CPX, will talk with Dr Caryl Comes about CRT upgrade.  Loralie Champagne 12/03/2013

## 2013-12-17 ENCOUNTER — Other Ambulatory Visit (HOSPITAL_COMMUNITY): Payer: 59

## 2014-01-07 ENCOUNTER — Encounter: Payer: Self-pay | Admitting: Internal Medicine

## 2014-01-10 ENCOUNTER — Encounter (HOSPITAL_COMMUNITY): Payer: 59

## 2014-01-14 ENCOUNTER — Encounter (HOSPITAL_COMMUNITY): Payer: 59

## 2014-01-14 ENCOUNTER — Encounter: Payer: 59 | Admitting: Internal Medicine

## 2014-01-16 ENCOUNTER — Ambulatory Visit (INDEPENDENT_AMBULATORY_CARE_PROVIDER_SITE_OTHER): Payer: 59 | Admitting: Sports Medicine

## 2014-01-16 VITALS — BP 110/60 | HR 85 | Temp 100.0°F | Resp 12 | Ht 76.5 in | Wt 208.2 lb

## 2014-01-16 DIAGNOSIS — J209 Acute bronchitis, unspecified: Secondary | ICD-10-CM

## 2014-01-16 DIAGNOSIS — J45909 Unspecified asthma, uncomplicated: Secondary | ICD-10-CM

## 2014-01-16 MED ORDER — HYDROCODONE-HOMATROPINE 5-1.5 MG/5ML PO SYRP: 5.0000 mL | ORAL_SOLUTION | Freq: Three times a day (TID) | ORAL | Status: DC | PRN

## 2014-01-16 NOTE — Patient Instructions (Signed)
You have viral infection that will resolve on its own over time.  Typically, symptoms can last for up to 10 days (or more) but should be continually improving after the 5th day.  If you exerpience worsening after the 7th day please call us back.  Please continue to drink plenty of fluids and remember you and everybody in your household need to wash their hands frequently!  Take acetaminophen (Tylenol) 1X500mg  or 2X325mg  tablets every 8 hours  Take Ibuprofen (Advil or Motrin) 2-3 200mg  tablets every 8 hours  Try to alternate the acetaminophen and ibuprofen so that you take one every 4 hours Honey has been shown to help with cough.  You can try mixing  a teaspoon of honey in warm water or caffeine free herbal tea before bedtime. We don't know why, but chicken soup also helps, try it!   Cough, Adult  A cough is a reflex that helps clear your throat and airways. It can help heal the body or may be a reaction to an irritated airway. A cough may only last 2 or 3 weeks (acute) or may last more than 8 weeks (chronic).  CAUSES Acute cough:  Viral or bacterial infections. Chronic cough:  Infections.  Allergies.  Asthma.  Post-nasal drip.  Smoking.  Heartburn or acid reflux.  Some medicines.  Chronic lung problems (COPD).  Cancer. SYMPTOMS   Cough.  Fever.  Chest pain.  Increased breathing rate.  High-pitched whistling sound when breathing (wheezing).  Colored mucus that you cough up (sputum). TREATMENT   A bacterial cough may be treated with antibiotic medicine.  A viral cough must run its course and will not respond to antibiotics.  Your caregiver may recommend other treatments if you have a chronic cough. HOME CARE INSTRUCTIONS   Only take over-the-counter or prescription medicines for pain, discomfort, or fever as directed by your caregiver. Use cough suppressants only as directed by your caregiver.  Use a cold steam vaporizer or humidifier in your bedroom or home  to help loosen secretions.  Sleep in a semi-upright position if your cough is worse at night.  Rest as needed.  Stop smoking if you smoke. SEEK IMMEDIATE MEDICAL CARE IF:   You have pus in your sputum.  Your cough starts to worsen.  You cannot control your cough with suppressants and are losing sleep.  You begin coughing up blood.  You have difficulty breathing.  You develop pain which is getting worse or is uncontrolled with medicine.  You have a fever. MAKE SURE YOU:   Understand these instructions.  Will watch your condition.  Will get help right away if you are not doing well or get worse. Document Released: 06/18/2010 Document Revised: 03/14/2011 Document Reviewed: 06/18/2010 [facility] Patient Information 2015 [place], [place]. This information is not intended to replace advice given to you by your health care provider. Make sure you discuss any questions you have with your health care provider.

## 2014-01-16 NOTE — Progress Notes (Signed)
  [name] - 44 y.o. male MRN [id number]  Date of birth: [birth year]  CC & HPI:  Chief Complaint  Patient presents with  . Cough    started Monday-Dry  . Fever  . Chills  . Fatigue    HPI: 44 year old male with complex medical history presenting with 3-4 days of worsening above symptoms. Reports he has been progressively worsening over the past 3-4 days and is become increasingly fatigued and he associates this with severe dry nonproductive cough that has worsened and is inhibiting him from sleeping. He has tried taking over-the-counter anti-inflammatories without significant improvement and tried over-the-counter cold and flu treatments without significant improvement. He has noted fevers to 100.83F. He denies any significant shortness of breath but has been using his albuterol inhaler on a more frequent basis due to the cough. He denies any chest pain, tachycardia palpitations, orthopnea, PND, lower extremity swelling or edema.  ROS:  Per HPI.   HISTORY: Past Medical, Surgical, Social, and Family History Reviewed & Updated per EMR.  Pertinent Historical Findings include: Congestive heart failure followed by the events start failure clinic for unknown etiology. He has AICD in place. Well controlled and followed closely. Chronically is on Vyvanse for long-standing ADHD is followed closely. History of testicular cancer status post orchiectomy Nonsmoker Works as a Warden/ranger at [employer]:  VS:   HT:6' 4.5" (194.3 cm)   WT:208 lb 4 oz (94.462 kg)  BMI:25.1          BP:110/60 mmHg  HR:85bpm  TEMP:100 F (37.8 C)(Oral)  RESP:99 %  PHYSICAL EXAM: GENERAL: Adult Caucasian male moderately ill appearing but nontoxic  PSYCH: alert and appropriate, good insight   NEURO: sensation is intact to light touch in BLE  VASCULAR: Radial, DP and PT pulses 2+/4.  No significant edema.    Heart: regular rate and rhythm, S1-S2 heard, no S3.    LUNGS: clear to  auscultation bilaterally, no crackles. Good air movement throughout. Slight end expiratory wheeze occasionally   Neck: No JVD, no reflux   Extremities: lower sugar swelling trace dorsalis pedis and posterior tibialis pulses 2+ out of 4.   Head and neck: Bilateral tympanic membranes pearly gray, minimal erythema but no bulging. Next line posterior oropharynx erythematous without exudate, no significant tonsillar hypertrophy. No significant cervical lymphadenopathy  ASSESSMENT: 1. Bronchitis with asthma, acute   Viral URI   This may reflect influenza but given his greater than 4-5 days he is not a candidate for Tamiflu to testing was deferred. We had a long discussion regarding further evaluation and patient would like to defer at this time and is aware of the red flags which warrant further evaluation including any worsening swelling, chest pain or worsening of systemic symptoms.  Specifically discussed X-ray and lab work and pt specifically requested to defer testing at this time.  PLAN: See problem based charting & AVS for additional documentation. prescription for cough medication provided today. Discussed need for return evaluation if not significantly improved with continued watchful waiting. > Return if symptoms worsen or fail to improve.

## 2014-01-19 ENCOUNTER — Ambulatory Visit (INDEPENDENT_AMBULATORY_CARE_PROVIDER_SITE_OTHER): Payer: 59 | Admitting: Family Medicine

## 2014-01-19 ENCOUNTER — Ambulatory Visit (INDEPENDENT_AMBULATORY_CARE_PROVIDER_SITE_OTHER): Payer: 59

## 2014-01-19 VITALS — BP 81/49 | HR 76 | Temp 98.4°F | Resp 16 | Ht 76.5 in | Wt 213.1 lb

## 2014-01-19 DIAGNOSIS — R059 Cough, unspecified: Secondary | ICD-10-CM

## 2014-01-19 DIAGNOSIS — R509 Fever, unspecified: Secondary | ICD-10-CM

## 2014-01-19 DIAGNOSIS — R05 Cough: Secondary | ICD-10-CM

## 2014-01-19 DIAGNOSIS — J45909 Unspecified asthma, uncomplicated: Secondary | ICD-10-CM

## 2014-01-19 DIAGNOSIS — J209 Acute bronchitis, unspecified: Secondary | ICD-10-CM

## 2014-01-19 DIAGNOSIS — J189 Pneumonia, unspecified organism: Secondary | ICD-10-CM

## 2014-01-19 LAB — POCT CBC
Granulocyte percent: 83.3 %G — AB (ref 37–80)
HCT, POC: 37 % — AB (ref 43.5–53.7)
Hemoglobin: 12.5 g/dL — AB (ref 14.1–18.1)
Lymph, poc: 1.1 (ref 0.6–3.4)
MCH, POC: 32.3 pg — AB (ref 27–31.2)
MCHC: 33.7 g/dL (ref 31.8–35.4)
MCV: 95.8 fL (ref 80–97)
MID (cbc): 0.4 (ref 0–0.9)
MPV: 6.6 fL (ref 0–99.8)
POC Granulocyte: 7.7 — AB (ref 2–6.9)
POC LYMPH PERCENT: 12.1 %L (ref 10–50)
POC MID %: 4.6 %M (ref 0–12)
Platelet Count, POC: 182 10*3/uL (ref 142–424)
RBC: 3.87 M/uL — AB (ref 4.69–6.13)
RDW, POC: 12.7 %
WBC: 9.3 10*3/uL (ref 4.6–10.2)

## 2014-01-19 LAB — POCT INFLUENZA A/B
Influenza A, POC: NEGATIVE
Influenza B, POC: NEGATIVE

## 2014-01-19 MED ORDER — LEVOFLOXACIN 500 MG PO TABS: 500.0000 mg | ORAL_TABLET | Freq: Every day | ORAL | Status: DC

## 2014-01-19 MED ORDER — HYDROCODONE-HOMATROPINE 5-1.5 MG/5ML PO SYRP: 5.0000 mL | ORAL_SOLUTION | Freq: Three times a day (TID) | ORAL | Status: DC | PRN

## 2014-01-19 NOTE — Progress Notes (Signed)
Subjective:    Patient ID: [name], male    DOB: November 29, 1970, 44 y.o.   MRN: [id number]  HPI Pt presents to clinic for a recheck.  He has been sick a week and was seen 3 days ago and was treated with cough medication only at his request.  Since then his symptoms have worsened - He continues to have a fever and dry cough with SOB and wheezing.  He has used his albuterol inhaler and only had short term relief from the medication.  He is achy and today has some slight nausea.  He is not dizzy more than normal since this illness started.  He has no congestion or sore throat.  Sleeping flat at night.  He has not been to work this week due to illness.  Pt has not had his flu vaccine. OTC - motrin  Review of Systems  Constitutional: Positive for chills and appetite change (decreased appetite). Fever: 101.  HENT: Negative for congestion and sore throat.   Respiratory: Positive for cough, shortness of breath and wheezing.   Gastrointestinal: Positive for nausea. Negative for vomiting and diarrhea.  Neurological: Positive for headaches.   Patient Active Problem List   Diagnosis Date Noted  . Chronic systolic CHF (congestive heart failure) 10/28/2013  . Congestive heart failure, unspecified 02/23/2011  . Paroxysmal ventricular tachycardia 02/23/2011  . Implantable cardiac defibrillator- Medtronic 02/23/2011  . HYPERTENSION, HEART CONTROLLED W/O ASSOC CHF 02/08/2010  . CARDIOMYOPATHY, SECONDARY 02/08/2010  . SYNCOPE 02/08/2010  . TESTICULAR CANCER 10/27/2009  . ASTHMA 10/27/2009   Prior to Admission medications   Medication Sig Start Date End Date Taking? Authorizing Provider  albuterol (PROVENTIL HFA;VENTOLIN HFA) 108 (90 BASE) MCG/ACT inhaler Inhale 2 puffs into the lungs 4 (four) times daily as needed for wheezing. 02/20/12  Yes Shawnee Knapp, MD  aspirin 81 MG tablet Take 81 mg by mouth daily.     Yes Historical Provider, MD  carvedilol (COREG) 12.5 MG tablet Take 1 tablet (12.5 mg  total) by mouth 2 (two) times daily. 11/08/13  Yes Deboraha Sprang, MD  fish oil-omega-3 fatty acids 1000 MG capsule Take 1 g by mouth daily.    Yes Historical Provider, MD  Glucosamine-Chondroit-Vit C-Mn (GLUCOSAMINE 1500 COMPLEX PO) Take 1 tablet by mouth daily.     Yes Historical Provider, MD  HYDROcodone-homatropine (HYCODAN) 5-1.5 MG/5ML syrup Take 5 mLs by mouth every 8 (eight) hours as needed for cough. 01/16/14  Yes Gerda Diss, DO  Lisdexamfetamine Dimesylate (VYVANSE PO) Take 70 mg by mouth daily.    Yes Historical Provider, MD  LORazepam (ATIVAN) 0.5 MG tablet Take 0.5 mg by mouth as needed for anxiety.   Yes Historical Provider, MD  Sacubitril-Valsartan (ENTRESTO) 24-26 MG TABS Take 24-26 mg by mouth 2 (two) times daily. 10/28/13  Yes Larey Dresser, MD  sildenafil (VIAGRA) 50 MG tablet Take 1 tablet (50 mg total) by mouth daily as needed for erectile dysfunction. 10/18/11  Yes Deboraha Sprang, MD  spironolactone (ALDACTONE) 25 MG tablet Take 0.5 tablets (12.5 mg total) by mouth daily. 12/03/13  Yes Larey Dresser, MD  Turmeric 500 MG CAPS Take 1,500 tablets by mouth daily.   Yes Historical Provider, MD   No Known Allergies  Medications, allergies, past medical history, surgical history, family history, social history and problem list reviewed and updated.      Objective:   Physical Exam  Constitutional: He is oriented to person, place, and time. He appears  well-developed and well-nourished.  BP 81/49 mmHg  Pulse 76  Temp(Src) 98.4 F (36.9 C) (Oral)  Resp 16  Ht 6' 4.5" (1.943 m)  Wt 213 lb 2 oz (96.673 kg)  BMI 25.61 kg/m2  SpO2 94%  Laying flat on his back waiting for me in the room.  Lays flat to talk with me during the exam.   HENT:  Head: Normocephalic and atraumatic.  Right Ear: Hearing, tympanic membrane, external ear and ear canal normal.  Left Ear: Hearing, tympanic membrane, external ear and ear canal normal.  Nose: Rhinorrhea (white) present.    Mouth/Throat: Uvula is midline, oropharynx is clear and moist and mucous membranes are normal.  Eyes: Conjunctivae are normal.  Neck: Normal range of motion.  Cardiovascular: Normal rate, regular rhythm and normal heart sounds.   No murmur heard. Pulmonary/Chest: Effort normal and breath sounds normal. He has no wheezes.  Neurological: He is alert and oriented to person, place, and time.  Skin: Skin is warm and dry.  Psychiatric: He has a normal mood and affect. His behavior is normal. Judgment and thought content normal.   [facility] reading (PRIMARY) by  Dr. Brigitte Pulse. RML infiltrate.    Results for orders placed or performed in visit on 01/19/14  POCT CBC  Result Value Ref Range   WBC 9.3 4.6 - 10.2 K/uL   Lymph, poc 1.1 0.6 - 3.4   POC LYMPH PERCENT 12.1 10 - 50 %L   MID (cbc) 0.4 0 - 0.9   POC MID % 4.6 0 - 12 %M   POC Granulocyte 7.7 (A) 2 - 6.9   Granulocyte percent 83.3 (A) 37 - 80 %G   RBC 3.87 (A) 4.69 - 6.13 M/uL   Hemoglobin 12.5 (A) 14.1 - 18.1 g/dL   HCT, POC 37.0 (A) 43.5 - 53.7 %   MCV 95.8 80 - 97 fL   MCH, POC 32.3 (A) 27 - 31.2 pg   MCHC 33.7 31.8 - 35.4 g/dL   RDW, POC 12.7 %   Platelet Count, POC 182 142 - 424 K/uL   MPV 6.6 0 - 99.8 fL  POCT Influenza A/B  Result Value Ref Range   Influenza A, POC Negative    Influenza B, POC Negative        Assessment & Plan:  Cough - Plan: DG Chest 2 View, POCT CBC, POCT Influenza A/B  Fever, unspecified fever cause  CAP (community acquired pneumonia) - treat more aggressively due to co-morbidities of cardiac issues - Plan: Basic metabolic panel, levofloxacin (LEVAQUIN) 500 MG tablet, HYDROcodone-homatropine (HYCODAN) 5-1.5 MG/5ML syrup  Low pressure - related to his medications - he will try to increase his salt for the next 2 days - he is currently not symptomatic -   He will recheck tomorrow if he is worse and if he is the same or better he will recheck at a f/u appt with Tor Netters on 01/21/2014.  Windell Hummingbird  PA-C  [facility] 01/19/2014 4:38 PM

## 2014-01-19 NOTE — Patient Instructions (Signed)
Dr Brigitte Pulse - 1/18 at 4pm - if you are worse tomorrow  Merced Hanners - 1/19 to recheck

## 2014-01-20 LAB — BASIC METABOLIC PANEL
BUN: 14 mg/dL (ref 6–23)
CO2: 27 mEq/L (ref 19–32)
Calcium: 8.4 mg/dL (ref 8.4–10.5)
Chloride: 94 mEq/L — ABNORMAL LOW (ref 96–112)
Creat: 1.25 mg/dL (ref 0.50–1.35)
Glucose, Bld: 106 mg/dL — ABNORMAL HIGH (ref 70–99)
Potassium: 4.4 mEq/L (ref 3.5–5.3)
Sodium: 130 mEq/L — ABNORMAL LOW (ref 135–145)

## 2014-01-21 ENCOUNTER — Encounter: Payer: Self-pay | Admitting: Family Medicine

## 2014-01-21 ENCOUNTER — Ambulatory Visit (INDEPENDENT_AMBULATORY_CARE_PROVIDER_SITE_OTHER): Payer: 59 | Admitting: Family Medicine

## 2014-01-21 VITALS — BP 104/70 | HR 68 | Temp 97.5°F | Resp 12 | Ht 76.5 in | Wt 208.0 lb

## 2014-01-21 DIAGNOSIS — J209 Acute bronchitis, unspecified: Secondary | ICD-10-CM

## 2014-01-21 DIAGNOSIS — E871 Hypo-osmolality and hyponatremia: Secondary | ICD-10-CM

## 2014-01-21 DIAGNOSIS — J45909 Unspecified asthma, uncomplicated: Secondary | ICD-10-CM

## 2014-01-21 DIAGNOSIS — R062 Wheezing: Secondary | ICD-10-CM

## 2014-01-21 DIAGNOSIS — J189 Pneumonia, unspecified organism: Secondary | ICD-10-CM

## 2014-01-21 LAB — BASIC METABOLIC PANEL
BUN: 16 mg/dL (ref 6–23)
CO2: 26 mEq/L (ref 19–32)
Calcium: 8.7 mg/dL (ref 8.4–10.5)
Chloride: 97 mEq/L (ref 96–112)
Creat: 1.14 mg/dL (ref 0.50–1.35)
Glucose, Bld: 96 mg/dL (ref 70–99)
Potassium: 4.4 mEq/L (ref 3.5–5.3)
Sodium: 133 mEq/L — ABNORMAL LOW (ref 135–145)

## 2014-01-21 MED ORDER — ALBUTEROL SULFATE HFA 108 (90 BASE) MCG/ACT IN AERS: 2.0000 | INHALATION_SPRAY | Freq: Four times a day (QID) | RESPIRATORY_TRACT | Status: DC | PRN

## 2014-01-21 NOTE — Progress Notes (Signed)
   Subjective:    Patient ID: [name], male    DOB: 08/13/1970, 44 y.o.   MRN: [id number]  HPI This is a very pleasant 44 yo male who presents today for follow up of CAP. He was seen 01/19/14 and CXR showed RML infiltrate. He was hypotensive and his sodium was 130. He reports feeling better, but his energy level is not at 100%. He is sleeping well, without disruption from cough. He was mistakenly taking hycodan 10 mls instead of 5 mls and pharmacy will not refill until tomorrow.  He continues to cough and wheeze occasionally. He is not getting much sputum production. He is using his albuterol inhaler 5x/day. Doesn't feel like he can't get his breath. Feels SOB with wheeze. Has resumed normal activities and is going to work after his appointment today.   Appetite improved, he ate a bacon cheeseburger with barbeque sauce to increase sodium intake. Fluid intake good.   Review of Systems Occasional chills, no fever, no chest pain, no nasal congestion, no sore throat, no ear pain/pressure, no headache.     Objective:   Physical Exam  Constitutional: He is oriented to person, place, and time. He appears well-developed.  Thin   HENT:  Head: Normocephalic and atraumatic.  Eyes: Conjunctivae are normal.  Neck: Normal range of motion. Neck supple.  Cardiovascular: Normal rate, regular rhythm and normal heart sounds.   Pulmonary/Chest: Effort normal and breath sounds normal. No respiratory distress. He has no wheezes. He has no rales.  Musculoskeletal: Normal range of motion.  Neurological: He is alert and oriented to person, place, and time.  Skin: Skin is warm and dry.  Psychiatric: He has a normal mood and affect. His behavior is normal. Judgment and thought content normal.  Vitals reviewed. BP 104/70 mmHg  Pulse 68  Temp(Src) 97.5 F (36.4 C) (Oral)  Resp 12  Ht 6' 4.5" (1.943 m)  Wt 208 lb (94.348 kg)  BMI 24.99 kg/m2 Unable to obtain pulse ox when patient first presented  due to cold hands. Recheck 98%    Assessment & Plan:  1. CAP (community acquired pneumonia) -patient improving, BP improved, breathing improved -Finish Levaquin - instructed to follow up if symptoms worsen- SOB, fever, chest pain  2. Wheeze - albuterol (PROVENTIL HFA;VENTOLIN HFA) 108 (90 BASE) MCG/ACT inhaler; Inhale 2 puffs into the lungs 4 (four) times daily as needed for wheezing.  Dispense: 1 Inhaler; Refill: 5  3. Bronchitis with asthma, acute - albuterol (PROVENTIL HFA;VENTOLIN HFA) 108 (90 BASE) MCG/ACT inhaler; Inhale 2 puffs into the lungs 4 (four) times daily as needed for wheezing.  Dispense: 1 Inhaler; Refill: 5  4. Low sodium levels - Basic metabolic panel   Elby Beck, FNP-BC  [facility]  01/24/2014 11:19 AM

## 2014-01-23 ENCOUNTER — Telehealth: Payer: Self-pay | Admitting: Family Medicine

## 2014-01-23 NOTE — Telephone Encounter (Signed)
Called patient to see how he is doing. He reports that wheezing is resolved and he definitely improving. He still feels fatigued. No fever or chills. Instructed him to RTC if his symptoms return or he has fever/chills.

## 2014-01-26 NOTE — Progress Notes (Signed)
Reviewed documentation and xray and agree w/ assessment and plan. Rita Prom, MD MPH   

## 2014-04-01 ENCOUNTER — Other Ambulatory Visit (HOSPITAL_COMMUNITY): Payer: Self-pay | Admitting: Cardiology

## 2014-04-03 ENCOUNTER — Encounter: Payer: 59 | Admitting: Internal Medicine

## 2014-05-05 ENCOUNTER — Encounter: Payer: Self-pay | Admitting: Internal Medicine

## 2014-05-05 ENCOUNTER — Ambulatory Visit (INDEPENDENT_AMBULATORY_CARE_PROVIDER_SITE_OTHER): Payer: 59 | Admitting: Internal Medicine

## 2014-05-05 VITALS — BP 98/54 | HR 76 | Ht 77.0 in | Wt 208.2 lb

## 2014-05-05 DIAGNOSIS — I5022 Chronic systolic (congestive) heart failure: Secondary | ICD-10-CM

## 2014-05-05 DIAGNOSIS — Z95810 Presence of automatic (implantable) cardiac defibrillator: Secondary | ICD-10-CM | POA: Diagnosis not present

## 2014-05-05 DIAGNOSIS — I429 Cardiomyopathy, unspecified: Secondary | ICD-10-CM | POA: Diagnosis not present

## 2014-05-05 DIAGNOSIS — I4729 Other ventricular tachycardia: Secondary | ICD-10-CM

## 2014-05-05 DIAGNOSIS — I428 Other cardiomyopathies: Secondary | ICD-10-CM

## 2014-05-05 DIAGNOSIS — I472 Ventricular tachycardia: Secondary | ICD-10-CM | POA: Diagnosis not present

## 2014-05-05 NOTE — Patient Instructions (Addendum)
Medication Instructions:  Your physician recommends that you continue on your current medications as directed. Please refer to the Current Medication list given to you today.  Labwork: None  Testing/Procedures: None  Follow-Up: Your physician recommends that you schedule a follow-up appointment in: at next available time with Dr. Haroldine Laws  Your physician recommends that you schedule a follow-up appointment in: 3 months with device clinic.  Thank you for choosing [facility]!!

## 2014-05-05 NOTE — Progress Notes (Signed)
Patient Care Team: No Pcp Per Patient as PCP - General (General Practice)   HPI  [name] is a 44 y.o. male Seen in followup for ICD implanted for secondary prevention.  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 Most recent ejection fraction was 25% or so.   He has had a history of symptomatic sustained ventricular tachycardia. Discussions were had concerning catheter ablation. It was elected to use sotalol but that was never initiated. He has had recurrent prolonged nonsustained ventricular tachycardia; his device has been programmed to minimize therapy for this.   He ended up with ICD shock 8/15  ATP failed to terminate VT, 24J shock failed and 35 joule shock was successful  There was associated syncope  Functionally quite stable; he was referred to the heart failure clinic where he was seen in December. Cardiopulmonary stress testing was recommended  But not consummated secondary to override he is scheduled vagal release.  He is feeling better about [place] being more proactive. During the winter, Raynaud's was more problematic than in the past.  He has had no chest pain or edema. He has no clear limitations in activity  ECG 5/15 demonstrated left bundle branch block with a QRS duration of 150 ms           Past Medical History  Diagnosis Date  . Systolic congestive heart failure     With ejection fraction of 25%, normal coronaries on Cath 02/2010  . Hypertension   . Syncope     episode occured Feb 2011, ICD placed  . 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. Asthma   . ADHD (attention deficit hyperactivity disorder)   . ETOH abuse   . Nonischemic cardiomyopathy   . Ventricular tachycardia   . ICD (implantable cardioverter-defibrillator), single- medtronic     Past Surgical History  Procedure Laterality Date  . Orchiectomy      with abdominal lymph node dissection  . Wrist fracture surgery      Current Outpatient Prescriptions    Medication Sig Dispense Refill  . albuterol (PROVENTIL HFA;VENTOLIN HFA) 108 (90 BASE) MCG/ACT inhaler Inhale 2 puffs into the lungs 4 (four) times daily as needed for wheezing. 1 Inhaler 5  . aspirin 81 MG tablet Take 81 mg by mouth daily.      . carvedilol (COREG) 6.25 MG tablet Take 6.25 mg by mouth 2 (two) times daily with a meal.    . ENTRESTO 24-26 MG TAKE 1 TABLET BY MOUTH 2 TIMES DAILY. 60 tablet 3  . fish oil-omega-3 fatty acids 1000 MG capsule Take 1 g by mouth daily.     . Glucosamine-Chondroit-Vit C-Mn (GLUCOSAMINE 1500 COMPLEX PO) Take 1 tablet by mouth daily.      Marland Kitchen levofloxacin (LEVAQUIN) 500 MG tablet Take 1 tablet (500 mg total) by mouth daily. 7 tablet 0  . Lisdexamfetamine Dimesylate (VYVANSE PO) Take 70 mg by mouth daily.     Marland Kitchen LORazepam (ATIVAN) 0.5 MG tablet Take 0.5 mg by mouth as needed for anxiety.    . sildenafil (VIAGRA) 50 MG tablet Take 1 tablet (50 mg total) by mouth daily as needed for erectile dysfunction. 10 tablet 2  . spironolactone (ALDACTONE) 25 MG tablet Take 0.5 tablets (12.5 mg total) by mouth daily. 45 tablet 3  . Turmeric 500 MG CAPS Take 1,500 tablets by mouth daily.     No current facility-administered medications for this visit.    No Known Allergies  Review  of Systems negative except from HPI and PMH  Physical Exam BP 98/54 mmHg  Pulse 76  Ht 6\' 5"  (1.956 m)  Wt 208 lb 3.2 oz (94.439 kg)  BMI 24.68 kg/m2 Well developed and well nourished in no acute distress HENT normal E scleral and icterus clear Neck Supple JVP flat; carotids brisk and full Clear to ausculation  Regular rate and rhythm, no murmurs gallops or rub Soft with active bowel sounds No clubbing cyanosis  Edema Alert and oriented, grossly normal motor and sensory function Skin Warm and Dry    Assessment and  Plan  Nonischemic cardiomyopathy  Congestive heart failure chronic  systolic  Ventricular tachycardia recurrent with ATP successful  Orthostatic  lightheadedness improved  Implantable defibrillator-Medtronic  High risk medication surveillance  We will refer him back to the heart failure clinic. I think thought regarding CRT upgrade is  importantly positive and we'll await CPX results.  He is euvolemic and will continue current medications.  Orthostatic lightheadedness is tolerable.  We will check a metabolic profile on his Aldactone

## 2014-05-06 LAB — CUP PACEART INCLINIC DEVICE CHECK
Battery Voltage: 3.1 V
Brady Statistic RV Percent Paced: 0.01 %
Date Time Interrogation Session: 20160502105716
HighPow Impedance: 19 Ohm
HighPow Impedance: 399 Ohm
HighPow Impedance: 43 Ohm
HighPow Impedance: 55 Ohm
Lead Channel Impedance Value: 399 Ohm
Lead Channel Pacing Threshold Amplitude: 0.75 V
Lead Channel Pacing Threshold Pulse Width: 0.4 ms
Lead Channel Sensing Intrinsic Amplitude: 10.125 mV
Lead Channel Sensing Intrinsic Amplitude: 7.75 mV
Lead Channel Setting Pacing Amplitude: 2.5 V
Lead Channel Setting Pacing Pulse Width: 0.4 ms
Lead Channel Setting Sensing Sensitivity: 0.45 mV
Zone Setting Detection Interval: 230 ms
Zone Setting Detection Interval: 270 ms
Zone Setting Detection Interval: 350 ms
Zone Setting Detection Interval: 400 ms

## 2014-05-16 ENCOUNTER — Emergency Department (HOSPITAL_COMMUNITY)
Admission: EM | Admit: 2014-05-16 | Discharge: 2014-05-16 | Disposition: A | Discharge status: Home / Self Care | Payer: 59 | Attending: Emergency Medicine | Admitting: Emergency Medicine

## 2014-05-16 ENCOUNTER — Emergency Department (HOSPITAL_COMMUNITY): Payer: 59

## 2014-05-16 ENCOUNTER — Encounter (HOSPITAL_COMMUNITY): Payer: Self-pay | Admitting: Emergency Medicine

## 2014-05-16 DIAGNOSIS — I5022 Chronic systolic (congestive) heart failure: Secondary | ICD-10-CM | POA: Diagnosis present

## 2014-05-16 DIAGNOSIS — Z95810 Presence of automatic (implantable) cardiac defibrillator: Secondary | ICD-10-CM | POA: Diagnosis present

## 2014-05-16 DIAGNOSIS — Z8673 Personal history of transient ischemic attack (TIA), and cerebral infarction without residual deficits: Secondary | ICD-10-CM | POA: Insufficient documentation

## 2014-05-16 DIAGNOSIS — I472 Ventricular tachycardia, unspecified: Secondary | ICD-10-CM

## 2014-05-16 DIAGNOSIS — I4729 Other ventricular tachycardia: Secondary | ICD-10-CM

## 2014-05-16 DIAGNOSIS — I10 Essential (primary) hypertension: Secondary | ICD-10-CM | POA: Diagnosis not present

## 2014-05-16 DIAGNOSIS — F909 Attention-deficit hyperactivity disorder, unspecified type: Secondary | ICD-10-CM | POA: Insufficient documentation

## 2014-05-16 DIAGNOSIS — Z7982 Long term (current) use of aspirin: Secondary | ICD-10-CM | POA: Diagnosis not present

## 2014-05-16 DIAGNOSIS — Z8547 Personal history of malignant neoplasm of testis: Secondary | ICD-10-CM

## 2014-05-16 DIAGNOSIS — I4891 Unspecified atrial fibrillation: Secondary | ICD-10-CM | POA: Diagnosis not present

## 2014-05-16 DIAGNOSIS — I5020 Unspecified systolic (congestive) heart failure: Secondary | ICD-10-CM | POA: Insufficient documentation

## 2014-05-16 DIAGNOSIS — J45909 Unspecified asthma, uncomplicated: Secondary | ICD-10-CM | POA: Insufficient documentation

## 2014-05-16 DIAGNOSIS — Z79899 Other long term (current) drug therapy: Secondary | ICD-10-CM | POA: Diagnosis not present

## 2014-05-16 DIAGNOSIS — R002 Palpitations: Secondary | ICD-10-CM | POA: Diagnosis present

## 2014-05-16 LAB — BASIC METABOLIC PANEL
Anion gap: 11 (ref 5–15)
BUN: 19 mg/dL (ref 6–20)
CO2: 23 mmol/L (ref 22–32)
Calcium: 9.2 mg/dL (ref 8.9–10.3)
Chloride: 104 mmol/L (ref 101–111)
Creatinine, Ser: 1.25 mg/dL — ABNORMAL HIGH (ref 0.61–1.24)
GFR calc Af Amer: >60 mL/min (ref >60)
GFR calc non Af Amer: >60 mL/min (ref >60)
Glucose, Bld: 93 mg/dL (ref 65–99)
Potassium: 4.3 mmol/L (ref 3.5–5.1)
Sodium: 138 mmol/L (ref 135–145)

## 2014-05-16 LAB — CBC
HCT: 45.7 % (ref 39.0–52.0)
Hemoglobin: 15.2 g/dL (ref 13.0–17.0)
MCH: 32.5 pg (ref 26.0–34.0)
MCHC: 33.3 g/dL (ref 30.0–36.0)
MCV: 97.6 fL (ref 78.0–100.0)
Platelets: 182 10*3/uL (ref 150–400)
RBC: 4.68 MIL/uL (ref 4.22–5.81)
RDW: 12.7 % (ref 11.5–15.5)
WBC: 6.8 10*3/uL (ref 4.0–10.5)

## 2014-05-16 LAB — I-STAT TROPONIN, ED: Troponin i, poc: 0.01 ng/mL (ref 0.00–0.08)

## 2014-05-16 LAB — MAGNESIUM: Magnesium: 2 mg/dL (ref 1.7–2.4)

## 2014-05-16 LAB — BRAIN NATRIURETIC PEPTIDE: B Natriuretic Peptide: 468.4 pg/mL — ABNORMAL HIGH (ref 0.0–100.0)

## 2014-05-16 MED ORDER — AMIODARONE HCL 200 MG PO TABS: 400.0000 mg | ORAL_TABLET | Freq: Every day | ORAL | Status: DC

## 2014-05-16 MED ORDER — DILTIAZEM HCL 25 MG/5ML IV SOLN
10.0000 mg | Freq: Once | INTRAVENOUS | Status: AC
  Administered 2014-05-16: 10 mg via INTRAVENOUS
  Filled 2014-05-16: qty 5

## 2014-05-16 MED ORDER — AMIODARONE HCL 400 MG PO TABS: 400.0000 mg | ORAL_TABLET | Freq: Every day | ORAL | Status: DC

## 2014-05-16 MED ORDER — AMIODARONE HCL 200 MG PO TABS
400.0000 mg | ORAL_TABLET | Freq: Once | ORAL | Status: AC
  Administered 2014-05-16: 400 mg via ORAL
  Filled 2014-05-16: qty 2

## 2014-05-16 MED ORDER — ASPIRIN EC 81 MG PO TBEC: 81.0000 mg | DELAYED_RELEASE_TABLET | Freq: Every day | ORAL | Status: DC

## 2014-05-16 MED ORDER — DILTIAZEM HCL 100 MG IV SOLR: 5.0000 mg/h | Freq: Once | INTRAVENOUS | Status: DC

## 2014-05-16 MED ORDER — SODIUM CHLORIDE 0.9 % IV BOLUS (SEPSIS)
250.0000 mL | Freq: Once | INTRAVENOUS | Status: AC
  Administered 2014-05-16: 250 mL via INTRAVENOUS

## 2014-05-16 NOTE — ED Notes (Signed)
Dr Harrison at bedside

## 2014-05-16 NOTE — Consult Note (Signed)
CARDIOLOGY CONSULT  NOTE   Patient ID: TYLERJAMES Barrett MRN: [id number] DOB/AGE: 04-Jun-1970 44 y.o.  Admit date: 05/16/2014  Primary Physician   No PCP Per Patient Primary Cardiologist  Dr. Aundra Dubin Reason for Consultation  Afib with RVR  HPI: [name] is a 44 y.o. male with a history of testicular cancer s/p chemo (1990s) nonischemic cardiomyopathy, LBBB, VT s/p ICD discharges, and ADD who presented to [facility] ED today with palpations and chest pain and found to be in new onset afib with RVR.  Patient has been followed by Dr Caryl Comes and Dr. Aundra Dubin. He has had a nonischemic cardiomyopathy known for at least 4-5 years. Coronary angiography in 3/12 showed no significant disease. Last echo in 02/2011 showed EF 25-30% with global hypokinesis. He had testicular cancer treated with chemotherapy in the 1990s but does not seem to have had a cardiotoxic regimen. He had an episode of VT with syncope ICD shock 8/15. ATP failed to terminate VT, 24J shock failed and 35 joule shock was successful  No ICD discharge since that time.His coreg was increased. Discussions were had concerning catheter ablation. It was elected to use sotalol but that was never initiated.  He was a relatively heavy drinker in the past but abstinence from ETOH for a year and half, did not improve his EF.Seen by Dr. Aundra Dubin 12/2013 for CHF evaluation and found to have a NYHA class I symptoms. Cardiopulmonary stress testing was recommended, but this never seems to have been completed. He has ADD and is taking Vyvanse (has been medicated for this for a long time now). Last seen Dr. Caryl Comes 05/05/14 and appred to be in euvolemic and referred again back to heart failure clinic for CRT upgrade. He was in [place] upuntil last night, when he noted palpitation and chest tightness. This morning he noticed more profound palpitation, chest tightness with diaphoresis and came to ED for further evaluation. He denies any chest pain, radiation of  pain, nausea, vomiting, SOB, headache, or leg edema.  In ED, lytes normal, BNP 468.4, POC I negative, Magnesium normal. CXR without active disease. EKG showed a.fib with rate of 116, LBBB and QRS of 160. ECG 05/2013 demonstrated sinus rhythm, left bundle branch block with a QRS duration of 150 ms.   Past Medical History  Diagnosis Date  . Systolic congestive heart failure     With ejection fraction of 25%, normal coronaries on Cath 02/2010  . Hypertension   . Syncope     episode occured Feb 2011, ICD placed  . 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. Asthma   . ADHD (attention deficit hyperactivity disorder)   . ETOH abuse   . Nonischemic cardiomyopathy   . Ventricular tachycardia   . ICD (implantable cardioverter-defibrillator), single- medtronic      Past Surgical History  Procedure Laterality Date  . Orchiectomy      with abdominal lymph node dissection  . Wrist fracture surgery      No Known Allergies  I have reviewed the patient's current medications     Prior to Admission medications   Medication Sig Start Date End Date Taking? Authorizing Provider  albuterol (PROVENTIL HFA;VENTOLIN HFA) 108 (90 BASE) MCG/ACT inhaler Inhale 2 puffs into the lungs 4 (four) times daily as needed for wheezing. 01/21/14  Yes Elby Beck, FNP  aspirin 81 MG tablet Take 81 mg by mouth daily.     Yes Historical Provider, MD  carvedilol (COREG) 6.25 MG tablet Take 6.25 mg by mouth 2 (  two) times daily with a meal.   Yes Historical Provider, MD  ENTRESTO 24-26 MG TAKE 1 TABLET BY MOUTH 2 TIMES DAILY. 04/01/14  Yes Larey Dresser, MD  fish oil-omega-3 fatty acids 1000 MG capsule Take 1 g by mouth daily.    Yes Historical Provider, MD  Glucosamine-Chondroit-Vit C-Mn (GLUCOSAMINE 1500 COMPLEX PO) Take 1 tablet by mouth daily.     Yes Historical Provider, MD  Lisdexamfetamine Dimesylate (VYVANSE PO) Take 70 mg by mouth daily.    Yes Historical Provider, MD  LORazepam (ATIVAN) 0.5 MG tablet Take 0.5 mg by mouth as  needed for anxiety.   Yes Historical Provider, MD  sildenafil (VIAGRA) 50 MG tablet Take 1 tablet (50 mg total) by mouth daily as needed for erectile dysfunction. 10/18/11  Yes Deboraha Sprang, MD  spironolactone (ALDACTONE) 25 MG tablet Take 0.5 tablets (12.5 mg total) by mouth daily. 12/03/13  Yes Larey Dresser, MD  Turmeric 500 MG CAPS Take 1,500 tablets by mouth daily.   Yes Historical Provider, MD  levofloxacin (LEVAQUIN) 500 MG tablet Take 1 tablet (500 mg total) by mouth daily. Patient not taking: Reported on 05/16/2014 01/19/14   Mancel Bale, PA-C     History   Social History  . Marital Status: Married    Spouse Name: N/A  . Number of Children: N/A  . Years of Education: N/A   Occupational History  . Part time Programme researcher, broadcasting/film/video    Social History Main Topics  . Smoking status: Never Smoker   . Smokeless tobacco: Never Used  . Alcohol Use: 0.0 oz/week    0 Standard drinks or equivalent per week     Comment: Beer, quit 2/12  . Drug Use: No  . Sexual Activity: Yes   Other Topics Concern  . Not on file   Social History Narrative    Family Status  Relation Status Death Age  . Mother Alive   . Father Deceased    Family History  Problem Relation Age of Onset  . Pancreatic cancer Maternal Grandfather   . Heart disease Mother     mother and sister with valve problems  . Cardiomyopathy Neg Hx      ROS:  Full 14 point review of systems complete and found to be negative unless listed above.  Physical Exam: Blood pressure 116/63, pulse 141, temperature 98.2 F (36.8 C), temperature source Oral, resp. rate 24, SpO2 100 %.  General: Well developed, well nourished, male in no acute distress Head: Eyes PERRLA, No xanthomas. Normocephalic and atraumatic, oropharynx without edema or exudate.  Lungs: Resp regular and unlabored, CTA. Heart:irregularly irregular, no s3, s4, or murmurs..   Neck: No carotid bruits. No lymphadenopathy.  No JVD. Abdomen: Bowel sounds present,  abdomen soft and non-tender without masses or hernias noted. Msk:  No spine or cva tenderness. No weakness, no joint deformities or effusions. Extremities: No clubbing, cyanosis or edema. DP/PT/Radials 2+ and equal bilaterally. Neuro: Alert and oriented X 3. No focal deficits noted. Psych:  Good affect, responds appropriately Skin: No rashes or lesions noted.  Labs:   Lab Results  Component Value Date   WBC 6.8 05/16/2014   HGB 15.2 05/16/2014   HCT 45.7 05/16/2014   MCV 97.6 05/16/2014   PLT 182 05/16/2014    Recent Labs Lab 05/16/14 1140  NA 138  K 4.3  CL 104  CO2 23  BUN 19  CREATININE 1.25*  CALCIUM 9.2  GLUCOSE 93   MAGNESIUM  Date Value Ref Range Status  05/16/2014 2.0 1.7 - 2.4 mg/dL Final     Recent Labs  05/16/14 1155  TROPIPOC 0.01   PRO B NATRIURETIC PEPTIDE (BNP)  Date/Time Value Ref Range Status  11/12/2013 04:40 PM 241.9* 0 - 125 pg/mL Final  10/28/2013 12:36 PM 541.4* 0 - 125 pg/mL Final    Echo: Study Date: 03/03/2011 LV EF: 25- 30% Study Conclusions - Left ventricle: The cavity size was moderately dilated.   Systolic function was severely reduced. The estimated   ejection fraction was in the range of 25% to 30%. Diffuse   hypokinesis. Doppler parameters are consistent with   abnormal left ventricular relaxation (grade 1 diastolic   dysfunction). - Aortic valve: There was no stenosis. - Aorta: Mildly dilated aortic root. Aortic root dimension:   37mm (ED). - Mitral valve: Trivial regurgitation. - Left atrium: The atrium was mildly dilated. - Right ventricle: The cavity size was normal. Pacer wire or   catheter noted in right ventricle. Systolic function was   normal. - Pulmonary arteries: No complete TR doppler jet so unable   to estimate PA systolic pressure. - Inferior vena cava: The vessel was normal in size; the   respirophasic diameter changes were in the normal range (=   50%); findings are consistent with normal central  venous   pressure. - Pericardium, extracardiac: A trivial pericardial effusion   was identified. Impressions: - Moderately dilated LV with severely depressed systolic   function, EF 16-10%. Global hypokinesis. Normal RV size   and systolic function. No significant valvular   dysfunction.   ECG: EKG showed a.fib with rate of 116, LBBB and QRS of 160.  Radiology:  Dg Chest Port 1 View  05/16/2014   CLINICAL DATA:  Chest pain. Palpitations. Weakness and shortness of breath.  EXAM: PORTABLE CHEST - 1 VIEW  COMPARISON:  01/19/2014  FINDINGS: Pacemaker/AICD remains in place. Heart size is at the upper limits of normal. The vascularity is normal. The lungs are clear. No effusions.  IMPRESSION: No active disease.   Electronically Signed   By: Nelson Chimes M.D.   On: 05/16/2014 12:43    ASSESSMENT AND PLAN:    Active Problems:   History of testicular cancer   Paroxysmal ventricular tachycardia   S/P implantation of automatic cardioverter/defibrillator (AICD)   Chronic systolic CHF (congestive heart failure)  [name] is a 44 y.o. male with a history of testicular cancer s/p chemo (1990s), HTN, nonischemic cardiomyopathy, LBBB, VT s/p ICD discharges, mod-heavy alcohol use and ADD who presented to [facility] ED today with palpations and chest pain and found to be in new onset afib with RVR.  New onset atrial fibrillation with RVR- rates better controlled after IV dilt in ED. Given 2 dose of IV dilt and then spontaneously converted back to sinus in a setting of VT vs ATP. Will add amio 400mg  daily and ASA 81mg  and have him seen at CHF clinic. Will do echo same day. Will give him one dose of amio and watch him and then he will be discharged.  -- CHADSVASC score at least 1 (CHF 1, HTN ?). Will add ASA.   --Will want to avoid CCBs in the setting of LV dysfunction -- He will need to cut back on his alcohol consumption  Chronic systolic CHF/Non-ischemic CM- s/p Medtronic ICD -- Appears euvolemic.  CXR clear. BNP mildly elevated ~600 -- Continue coreg 6.25mg  BID, Entresto 24-26, spironolactone 25mg  qd.  -- Has been going  through ongoing work up for possible CTR-D placement, waiting CPX testing (has been rescheduled several times per patient)  -- Will have device interrogated.   ADD- continue vyvance. ? If this could contribute to afib?   SignedLeanor Kail, PA 05/16/2014, 2:32 PM Pager [phone]  Co-Sign MD

## 2014-05-16 NOTE — ED Notes (Signed)
PT placed in gown and in bed. Pt monitored by pulse ox, bp cuff, and 12-lead. 

## 2014-05-16 NOTE — ED Notes (Signed)
Notified Dr Rogers Blocker pts HR increased to 120s-150s.

## 2014-05-16 NOTE — H&P (Deleted)
CARDIOLOGY CONSULT  NOTE   Patient ID: [name] MRN: [id number] DOB/AGE: 1970/02/08 45 y.o.  Admit date: 05/16/2014  Primary Physician   No PCP Per Patient Primary Cardiologist  Dr. Aundra Dubin Reason for Consultation  Afib with RVR  HPI: [name] is a 44 y.o. male with a history of testicular cancer s/p chemo (1990s) nonischemic cardiomyopathy, LBBB, VT s/p ICD discharges, and ADD who presented to [facility] ED today with palpations and chest pain and found to be in new onset afib with RVR.  Patient has been followed by Dr Caryl Comes and Dr. Aundra Dubin. He has had a nonischemic cardiomyopathy known for at least 4-5 years. Coronary angiography in 3/12 showed no significant disease. Last echo in 02/2011 showed EF 25-30% with global hypokinesis. He had testicular cancer treated with chemotherapy in the 1990s but does not seem to have had a cardiotoxic regimen. He had an episode of VT with syncope ICD shock 8/15. ATP failed to terminate VT, 24J shock failed and 35 joule shock was successful  No ICD discharge since that time.His coreg was increased. Discussions were had concerning catheter ablation. It was elected to use sotalol but that was never initiated.  He was a relatively heavy drinker in the past but abstinence from ETOH for a year and half, did not improve his EF.Seen by Dr. Aundra Dubin 12/2013 for CHF evaluation and found to have a NYHA class I symptoms. Cardiopulmonary stress testing was recommended, but this never seems to have been completed. He has ADD and is taking Vyvanse (has been medicated for this for a long time now). Last seen Dr. Caryl Comes 05/05/14 and appred to be in euvolemic and referred again back to heart failure clinic for CRT upgrade. He was in [place] upuntil last night, when he noted palpitation and chest tightness. This morning he noticed more profound palpitation, chest tightness with diaphoresis and came to ED for further evaluation. He denies any chest pain, radiation of  pain, nausea, vomiting, SOB, headache, or leg edema.  In ED, lytes normal, BNP 468.4, POC I negative, Magnesium normal. CXR without active disease. EKG showed a.fib with rate of 116, LBBB and QRS of 160. ECG 05/2013 demonstrated sinus rhythm, left bundle branch block with a QRS duration of 150 ms.   Past Medical History  Diagnosis Date  . Systolic congestive heart failure     With ejection fraction of 25%, normal coronaries on Cath 02/2010  . Hypertension   . Syncope     episode occured Feb 2011, ICD placed  . 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. Asthma   . ADHD (attention deficit hyperactivity disorder)   . ETOH abuse   . Nonischemic cardiomyopathy   . Ventricular tachycardia   . ICD (implantable cardioverter-defibrillator), single- medtronic      Past Surgical History  Procedure Laterality Date  . Orchiectomy      with abdominal lymph node dissection  . Wrist fracture surgery      No Known Allergies  I have reviewed the patient's current medications     Prior to Admission medications   Medication Sig Start Date End Date Taking? Authorizing Provider  albuterol (PROVENTIL HFA;VENTOLIN HFA) 108 (90 BASE) MCG/ACT inhaler Inhale 2 puffs into the lungs 4 (four) times daily as needed for wheezing. 01/21/14  Yes Elby Beck, FNP  aspirin 81 MG tablet Take 81 mg by mouth daily.     Yes Historical Provider, MD  carvedilol (COREG) 6.25 MG tablet Take 6.25 mg by mouth 2 (  two) times daily with a meal.   Yes Historical Provider, MD  ENTRESTO 24-26 MG TAKE 1 TABLET BY MOUTH 2 TIMES DAILY. 04/01/14  Yes Larey Dresser, MD  fish oil-omega-3 fatty acids 1000 MG capsule Take 1 g by mouth daily.    Yes Historical Provider, MD  Glucosamine-Chondroit-Vit C-Mn (GLUCOSAMINE 1500 COMPLEX PO) Take 1 tablet by mouth daily.     Yes Historical Provider, MD  Lisdexamfetamine Dimesylate (VYVANSE PO) Take 70 mg by mouth daily.    Yes Historical Provider, MD  LORazepam (ATIVAN) 0.5 MG tablet Take 0.5 mg by mouth as  needed for anxiety.   Yes Historical Provider, MD  sildenafil (VIAGRA) 50 MG tablet Take 1 tablet (50 mg total) by mouth daily as needed for erectile dysfunction. 10/18/11  Yes Deboraha Sprang, MD  spironolactone (ALDACTONE) 25 MG tablet Take 0.5 tablets (12.5 mg total) by mouth daily. 12/03/13  Yes Larey Dresser, MD  Turmeric 500 MG CAPS Take 1,500 tablets by mouth daily.   Yes Historical Provider, MD  levofloxacin (LEVAQUIN) 500 MG tablet Take 1 tablet (500 mg total) by mouth daily. Patient not taking: Reported on 05/16/2014 01/19/14   Mancel Bale, PA-C     History   Social History  . Marital Status: Married    Spouse Name: N/A  . Number of Children: N/A  . Years of Education: N/A   Occupational History  . Part time Programme researcher, broadcasting/film/video    Social History Main Topics  . Smoking status: Never Smoker   . Smokeless tobacco: Never Used  . Alcohol Use: 0.0 oz/week    0 Standard drinks or equivalent per week     Comment: Beer, quit 2/12  . Drug Use: No  . Sexual Activity: Yes   Other Topics Concern  . Not on file   Social History Narrative    Family Status  Relation Status Death Age  . Mother Alive   . Father Deceased    Family History  Problem Relation Age of Onset  . Pancreatic cancer Maternal Grandfather   . Heart disease Mother     mother and sister with valve problems  . Cardiomyopathy Neg Hx      ROS:  Full 14 point review of systems complete and found to be negative unless listed above.  Physical Exam: Blood pressure 116/63, pulse 141, temperature 98.2 F (36.8 C), temperature source Oral, resp. rate 24, SpO2 100 %.  General: Well developed, well nourished, male in no acute distress Head: Eyes PERRLA, No xanthomas. Normocephalic and atraumatic, oropharynx without edema or exudate.  Lungs: Resp regular and unlabored, CTA. Heart:irregularly irregular, no s3, s4, or murmurs..   Neck: No carotid bruits. No lymphadenopathy.  No JVD. Abdomen: Bowel sounds present,  abdomen soft and non-tender without masses or hernias noted. Msk:  No spine or cva tenderness. No weakness, no joint deformities or effusions. Extremities: No clubbing, cyanosis or edema. DP/PT/Radials 2+ and equal bilaterally. Neuro: Alert and oriented X 3. No focal deficits noted. Psych:  Good affect, responds appropriately Skin: No rashes or lesions noted.  Labs:   Lab Results  Component Value Date   WBC 6.8 05/16/2014   HGB 15.2 05/16/2014   HCT 45.7 05/16/2014   MCV 97.6 05/16/2014   PLT 182 05/16/2014    Recent Labs Lab 05/16/14 1140  NA 138  K 4.3  CL 104  CO2 23  BUN 19  CREATININE 1.25*  CALCIUM 9.2  GLUCOSE 93   MAGNESIUM  Date Value Ref Range Status  05/16/2014 2.0 1.7 - 2.4 mg/dL Final     Recent Labs  05/16/14 1155  TROPIPOC 0.01   PRO B NATRIURETIC PEPTIDE (BNP)  Date/Time Value Ref Range Status  11/12/2013 04:40 PM 241.9* 0 - 125 pg/mL Final  10/28/2013 12:36 PM 541.4* 0 - 125 pg/mL Final    Echo: Study Date: 03/03/2011 LV EF: 25- 30% Study Conclusions - Left ventricle: The cavity size was moderately dilated.   Systolic function was severely reduced. The estimated   ejection fraction was in the range of 25% to 30%. Diffuse   hypokinesis. Doppler parameters are consistent with   abnormal left ventricular relaxation (grade 1 diastolic   dysfunction). - Aortic valve: There was no stenosis. - Aorta: Mildly dilated aortic root. Aortic root dimension:   63mm (ED). - Mitral valve: Trivial regurgitation. - Left atrium: The atrium was mildly dilated. - Right ventricle: The cavity size was normal. Pacer wire or   catheter noted in right ventricle. Systolic function was   normal. - Pulmonary arteries: No complete TR doppler jet so unable   to estimate PA systolic pressure. - Inferior vena cava: The vessel was normal in size; the   respirophasic diameter changes were in the normal range (=   50%); findings are consistent with normal central  venous   pressure. - Pericardium, extracardiac: A trivial pericardial effusion   was identified. Impressions: - Moderately dilated LV with severely depressed systolic   function, EF 09-32%. Global hypokinesis. Normal RV size   and systolic function. No significant valvular   dysfunction.   ECG: EKG showed a.fib with rate of 116, LBBB and QRS of 160.  Radiology:  Dg Chest Port 1 View  05/16/2014   CLINICAL DATA:  Chest pain. Palpitations. Weakness and shortness of breath.  EXAM: PORTABLE CHEST - 1 VIEW  COMPARISON:  01/19/2014  FINDINGS: Pacemaker/AICD remains in place. Heart size is at the upper limits of normal. The vascularity is normal. The lungs are clear. No effusions.  IMPRESSION: No active disease.   Electronically Signed   By: Nelson Chimes M.D.   On: 05/16/2014 12:43    ASSESSMENT AND PLAN:    Active Problems:   History of testicular cancer   Paroxysmal ventricular tachycardia   S/P implantation of automatic cardioverter/defibrillator (AICD)   Chronic systolic CHF (congestive heart failure)  [name] is a 44 y.o. male with a history of testicular cancer s/p chemo (1990s), HTN, nonischemic cardiomyopathy, LBBB, VT s/p ICD discharges, mod-heavy alcohol use and ADD who presented to [facility] ED today with palpations and chest pain and found to be in new onset afib with RVR.  New onset atrial fibrillation with RVR- rates better controlled after IV dilt in ED.  -- CHADSVASC score at least 2 (CHF 1, HTN 1). He will likely need long term anticoagulation. Place on heparin gtt for now.  -- Feeling better now with rate control but has some residual chest tightness. -- May be prudent to get him back in to NSR with NICM, LBBB and CHF hx. Will want to avoid CCBs in the setting of LV dysfunction -- Will obtain repeat 2D ECHO, TSH -- He will need to cut back on his alcohol consumption - Plan to increase coreg to 12.5mg  BID and avoid CCB. May consider adding amio.   Chronic systolic  CHF/Non-ischemic CM- s/p Medtronic ICD -- Appears euvolemic. CXR clear. BNP mildly elevated ~600 -- Continue coreg 6.25mg  BID, Entresto 24-26, spironolactone 25mg   qd. Plan to increase coreg to 12.5mg  BID and avoid CCB. -- Has been going through ongoing work up for possible CTR-D placement, waiting CPX testing (has been rescheduled several times per patient)  -- Will have device interrogated.   ADD- continue vyvance. ? If this could contribute to afib?   SignedLeanor Kail, PA 05/16/2014, 2:32 PM Pager [phone]  Co-Sign MD

## 2014-05-16 NOTE — ED Provider Notes (Signed)
CSN: [id number]     Arrival date & time 05/16/14  1133 History   First MD Initiated Contact with Patient 05/16/14 1134     Chief Complaint  Patient presents with  . Palpitations  . Chest Pain     (Consider location/radiation/quality/duration/timing/severity/associated sxs/prior Treatment) Patient is a 44 y.o. male presenting with palpitations and chest pain. The history is provided by the patient.  Palpitations Palpitations quality:  Fast Onset quality:  Sudden Duration:  1 day Timing:  Constant Progression:  Unchanged Chronicity:  New Relieved by:  Nothing Worsened by:  Nothing Ineffective treatments:  None tried Associated symptoms: chest pain, chest pressure and near-syncope   Associated symptoms: no back pain, no cough, no diaphoresis, no dizziness, no lower extremity edema, no malaise/fatigue, no nausea, no numbness, no shortness of breath, no syncope, no vomiting and no weakness   Chest pain:    Quality: tightness     Severity:  Mild   Onset quality:  Gradual   Duration:  1 day   Timing:  Constant   Progression:  Unchanged   Chronicity:  New Chest Pain Associated symptoms: near-syncope and palpitations   Associated symptoms: no abdominal pain, no back pain, no cough, no diaphoresis, no dizziness, no fatigue, no fever, no headache, no lower extremity edema, no nausea, no numbness, no shortness of breath, no syncope, not vomiting and no weakness     Past Medical History  Diagnosis Date  . Systolic congestive heart failure     With ejection fraction of 25%, normal coronaries on Cath 02/2010  . Hypertension   . Syncope     episode occured Feb 2011, ICD placed  . 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. Asthma   . ADHD (attention deficit hyperactivity disorder)   . ETOH abuse   . Nonischemic cardiomyopathy   . Ventricular tachycardia   . ICD (implantable cardioverter-defibrillator), single- medtronic    Past Surgical History  Procedure Laterality Date  . Orchiectomy      with  abdominal lymph node dissection  . Wrist fracture surgery     Family History  Problem Relation Age of Onset  . Pancreatic cancer Maternal Grandfather   . Heart disease Mother     mother and sister with valve problems  . Cardiomyopathy Neg Hx    History  Substance Use Topics  . Smoking status: Never Smoker   . Smokeless tobacco: Never Used  . Alcohol Use: 0.0 oz/week    0 Standard drinks or equivalent per week     Comment: Beer, quit 2/12    Review of Systems  Constitutional: Negative for fever, chills, malaise/fatigue, diaphoresis and fatigue.  Eyes: Negative for photophobia and visual disturbance.  Respiratory: Positive for chest tightness. Negative for cough, shortness of breath and wheezing.   Cardiovascular: Positive for chest pain, palpitations and near-syncope. Negative for leg swelling and syncope.  Gastrointestinal: Negative for nausea, vomiting and abdominal pain.  Musculoskeletal: Negative for back pain, neck pain and neck stiffness.  Skin: Negative for color change, pallor and rash.  Neurological: Positive for light-headedness. Negative for dizziness, syncope, weakness, numbness and headaches.  All other systems reviewed and are negative.     Allergies  Review of patient's allergies indicates no known allergies.  Home Medications   Prior to Admission medications   Medication Sig Start Date End Date Taking? Authorizing Provider  albuterol (PROVENTIL HFA;VENTOLIN HFA) 108 (90 BASE) MCG/ACT inhaler Inhale 2 puffs into the lungs 4 (four) times daily as needed for wheezing. 01/21/14  Yes Elby Beck, FNP  aspirin 81 MG tablet Take 81 mg by mouth daily.     Yes Historical Provider, MD  carvedilol (COREG) 6.25 MG tablet Take 6.25 mg by mouth 2 (two) times daily with a meal.   Yes Historical Provider, MD  ENTRESTO 24-26 MG TAKE 1 TABLET BY MOUTH 2 TIMES DAILY. 04/01/14  Yes Larey Dresser, MD  fish oil-omega-3 fatty acids 1000 MG capsule Take 1 g by mouth daily.     Yes Historical Provider, MD  Glucosamine-Chondroit-Vit C-Mn (GLUCOSAMINE 1500 COMPLEX PO) Take 1 tablet by mouth daily.     Yes Historical Provider, MD  Lisdexamfetamine Dimesylate (VYVANSE PO) Take 70 mg by mouth daily.    Yes Historical Provider, MD  LORazepam (ATIVAN) 0.5 MG tablet Take 0.5 mg by mouth as needed for anxiety.   Yes Historical Provider, MD  sildenafil (VIAGRA) 50 MG tablet Take 1 tablet (50 mg total) by mouth daily as needed for erectile dysfunction. 10/18/11  Yes Deboraha Sprang, MD  spironolactone (ALDACTONE) 25 MG tablet Take 0.5 tablets (12.5 mg total) by mouth daily. 12/03/13  Yes Larey Dresser, MD  Turmeric 500 MG CAPS Take 1,500 tablets by mouth daily.   Yes Historical Provider, MD  amiodarone (PACERONE) 400 MG tablet Take 1 tablet (400 mg total) by mouth daily. 05/16/14   Ellwood Dense, MD  levofloxacin (LEVAQUIN) 500 MG tablet Take 1 tablet (500 mg total) by mouth daily. Patient not taking: Reported on 05/16/2014 01/19/14   Mancel Bale, PA-C   BP 107/70 mmHg  Pulse 68  Temp(Src) 98.2 F (36.8 C) (Oral)  Resp 11  SpO2 98% Physical Exam  Constitutional: He is oriented to person, place, and time. He appears well-developed and well-nourished. No distress.  HENT:  Head: Normocephalic and atraumatic.  Mouth/Throat: Oropharynx is clear and moist.  Eyes: Conjunctivae and EOM are normal. Pupils are equal, round, and reactive to light.  Neck: Normal range of motion. Neck supple.  Cardiovascular: Normal heart sounds and intact distal pulses.  An irregularly irregular rhythm present. Tachycardia present.  Exam reveals no gallop and no friction rub.   No murmur heard. Pulmonary/Chest: Effort normal and breath sounds normal. No respiratory distress. He has no wheezes. He has no rales.  Abdominal: Soft. Bowel sounds are normal. He exhibits no distension. There is no tenderness. There is no rebound and no guarding.  Musculoskeletal: Normal range of motion. He exhibits no edema or  tenderness.  Neurological: He is alert and oriented to person, place, and time. He has normal strength and normal reflexes. No cranial nerve deficit or sensory deficit. Coordination normal. GCS eye subscore is 4. GCS verbal subscore is 5. GCS motor subscore is 6.  Skin: Skin is warm and dry. No rash noted. He is not diaphoretic. No erythema. No pallor.  Nursing note and vitals reviewed.   ED Course  Procedures (including critical care time) Labs Review Labs Reviewed  BASIC METABOLIC PANEL - Abnormal; Notable for the following:    Creatinine, Ser 1.25 (*)    All other components within normal limits  BRAIN NATRIURETIC PEPTIDE - Abnormal; Notable for the following:    B Natriuretic Peptide 468.4 (*)    All other components within normal limits  CBC  MAGNESIUM  I-STAT TROPOININ, ED    Imaging Review Dg Chest Port 1 View  05/16/2014   CLINICAL DATA:  Chest pain. Palpitations. Weakness and shortness of breath.  EXAM: PORTABLE CHEST - 1 VIEW  COMPARISON:  01/19/2014  FINDINGS: Pacemaker/AICD remains in place. Heart size is at the upper limits of normal. The vascularity is normal. The lungs are clear. No effusions.  IMPRESSION: No active disease.   Electronically Signed   By: Nelson Chimes M.D.   On: 05/16/2014 12:43     EKG Interpretation   Date/Time:  Friday May 16 2014 11:41:27 EDT Ventricular Rate:  116 PR Interval:    QRS Duration: 160 QT Interval:  375 QTC Calculation: 521 R Axis:   -81 Text Interpretation:  Atrial fibrillation Left bundle branch block  Confirmed by HARRISON  MD, FORREST ([id number]) on 05/16/2014 11:44:52 AM      MDM   Final diagnoses:  Atrial fibrillation, unspecified    44 yo M with PMH of NICM 2/2 prior chemo with EF 25%, paroxysmal VT s/p ICD placement, HTN, presenting with palpitations, chest pain, lightheadedness.  Onset this morning.  Chest pain described as tightness.  +lightheadedness, diaphoresis.  No syncopal episodes.  Denies ICD firing.    On  presentation, HR in 120s-150s in irregularly irregular rhythm.  BP soft at 98/54.  Pt alert, mentating appropriately, in NAD.  Reports mild chest tightness currently.  CV exam with tachycardia, irregularly irregular rhythm. Lungs clear.  Abdomen soft, non-tender.  No LE edema.  No other acute findings.  EKG on arrival shows Afib with RVR, LBBB.  Per chart review pt has known LBBB since 5/15.  Plan for troponin, basic labs, mag, CXR.  Will give small fluid bolus and trial of Diltiazem bolus.  Lab results WNL, troponin negative.  CXR unremarkable.  HR improved to 90s following Dilt although still in Afib.  Plan for Cardiology consult.  Pt again tachycardic in 130s.  Sporadic brief runs of VT.  No ICD firings.  Given second dilt bolus again with improved HR.  Cardiology evaluating.  During Cards eval, pt converted to NSR, HR 68.  EKG obtained confirms this. Pt reports resolution of symptoms.  Per Cardiology- advise starting on PO amiodarone, first dose given in ED.  Will discharge with close Cardiology f/u which they will schedule.  Pt agreeable to this plan.  Strict ED return precautions given for recurring chest pain, palpitations, dizziness, or any other concerns.  No further questions, stable at discharge.  Discussed with attending Dr. Aline Brochure.    Ellwood Dense, MD 05/16/14 1653  Pamella Pert, MD 05/17/14 [id number]

## 2014-05-16 NOTE — Discharge Instructions (Signed)

## 2014-05-16 NOTE — ED Notes (Signed)
Pt from home with c/o palpitations and chest tightness/discomfort starting last pm, worsening today.  Pt reports feeling lightheaded and diaphoretic.  Pt wife reported low SBP 70s and 80s with a VT HR.  Pt in NAD, A&O.

## 2014-05-26 ENCOUNTER — Ambulatory Visit (HOSPITAL_COMMUNITY)
Admission: RE | Admit: 2014-05-26 | Discharge: 2014-05-26 | Disposition: A | Discharge status: Home / Self Care | Payer: 59 | Source: Ambulatory Visit | Attending: Cardiology | Admitting: Cardiology

## 2014-05-26 ENCOUNTER — Ambulatory Visit (HOSPITAL_BASED_OUTPATIENT_CLINIC_OR_DEPARTMENT_OTHER): Admit: 2014-05-26 | Discharge: 2014-05-26 | Disposition: A | Discharge status: Home / Self Care | Payer: 59

## 2014-05-26 ENCOUNTER — Encounter (HOSPITAL_COMMUNITY): Payer: Self-pay

## 2014-05-26 VITALS — BP 106/54 | HR 67 | Wt 213.4 lb

## 2014-05-26 DIAGNOSIS — J45909 Unspecified asthma, uncomplicated: Secondary | ICD-10-CM | POA: Diagnosis not present

## 2014-05-26 DIAGNOSIS — I10 Essential (primary) hypertension: Secondary | ICD-10-CM | POA: Insufficient documentation

## 2014-05-26 DIAGNOSIS — Z8547 Personal history of malignant neoplasm of testis: Secondary | ICD-10-CM | POA: Insufficient documentation

## 2014-05-26 DIAGNOSIS — Z95810 Presence of automatic (implantable) cardiac defibrillator: Secondary | ICD-10-CM | POA: Diagnosis not present

## 2014-05-26 DIAGNOSIS — Z7982 Long term (current) use of aspirin: Secondary | ICD-10-CM | POA: Diagnosis not present

## 2014-05-26 DIAGNOSIS — I480 Paroxysmal atrial fibrillation: Secondary | ICD-10-CM | POA: Insufficient documentation

## 2014-05-26 DIAGNOSIS — I4891 Unspecified atrial fibrillation: Secondary | ICD-10-CM | POA: Diagnosis not present

## 2014-05-26 DIAGNOSIS — Z79899 Other long term (current) drug therapy: Secondary | ICD-10-CM | POA: Insufficient documentation

## 2014-05-26 DIAGNOSIS — Z9221 Personal history of antineoplastic chemotherapy: Secondary | ICD-10-CM | POA: Diagnosis not present

## 2014-05-26 DIAGNOSIS — I429 Cardiomyopathy, unspecified: Secondary | ICD-10-CM | POA: Diagnosis not present

## 2014-05-26 DIAGNOSIS — I5022 Chronic systolic (congestive) heart failure: Secondary | ICD-10-CM

## 2014-05-26 MED ORDER — SPIRONOLACTONE 25 MG PO TABS: 25.0000 mg | ORAL_TABLET | Freq: Every day | ORAL | Status: DC

## 2014-05-26 NOTE — Patient Instructions (Addendum)
INCREASE Spironolactone to 25 mg daily STOP Amiodarone  Labs needed in 10 days (BMET,BNP)  Your physician recommends that you schedule a follow-up appointment in: 2 months  Do the following things EVERYDAY: 1) Weigh yourself in the morning before breakfast. Write it down and keep it in a log. 2) Take your medicines as prescribed 3) Eat low salt foods-Limit salt (sodium) to 2000 mg per day.  4) Stay as active as you can everyday 5) Limit all fluids for the day to less than 2 liters. 6)

## 2014-05-26 NOTE — Progress Notes (Signed)
Patient ID: [name], male   DOB: [birth year], 44 y.o.   MRN: [id number] EP: Dr. Caryl Comes  44 yo with history of nonischemic cardiomyopathy, VT s/p ICD discharges, paroxysmal atrial fibrillation, and ADD presents for CHF clinic evaluation.  Patient has been followed by Dr Caryl Comes.  He has had a nonischemic cardiomyopathy known for at least 5 years now.  Coronary angiography in 3/12 showed no significant disease.  I reviewed today's echo.  This showed EF 30% with diffuse hypokinesis and septal-lateral dyssynchrony (stable compared to prior).  He had testicular cancer treated with chemotherapy in the 1990s but does not seem to have had a cardiotoxic regimen.  He was a relatively heavy drinker in the past but abstinence from ETOH for a year did not improve his EF.  He now drinks moderately, typically 1-2 drinks/day.  He had an episode of VT with syncope and successful ICD discharge in 8/15.  He saw Dr Caryl Comes after this and Coreg was increased.  He has ADD and is taking Vyvanse (has been medicated for this for a long time now).    He was seen in the ER 05/16/14 with fatigue/dyspnea and was found to be in atrial fibrillation with RVR.  He was given IV diltiazem and converted back to NSR.  He was started on amiodarone for maintenance of NSR and sent home from the ER.  He is dyspneic with heavy exertion.  He has no problem with mild to moderate exertion like walking on flat ground or up steps.  No chest pain, no orthopnea/PND.  No lightheadedness or syncope.  He has felt bad since starting amiodarone: fatigue, nausea, jaw pain, myalgias.  He feels like his Raynauds symptoms are worse.    Optivol was assessed today: fluid index < threshold with stable thoracic impedance.  6-7 active hours/day.   ECG: NSR, LBBB with QRS 150 msec  Labs (1/15): K 3.9, creatinine 1.3 Labs (11/15): K 4, creatinine 1.11, proBNP 242 Labs (5/16): K 4.3, creatinine 1.25, HCT 45.7, BNP 468, Mg 2  PMH: 1. Nonischemic cardiomyopathy:  Known for several years.  LHC (3/12) with normal coronaries.  Cardiac MRI (2/12) with EF 27%, normal RV size/systolic function, no myocardial delayed enhancement.  Echo (2/13) with EF 25-30%, global hypokinesis, RV normal systolic and systolic function.  Echo (5/16) with EF 30%, diffuse hypokinesis, septal-lateral dyssynchrony, grade II diastolic dysfunction, normal RV size and systolic function.  2. H/o VT: Has Medtronic ICD.  8/15 episode of syncope due to VT with ICD discharge.  3. ADD 4. 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  Regimen not thought to have been cardiotoxic.   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   FH: Mother and sister had heart valve replacements.  ROS: All systems reviewed and negative except as per HPI.   Current Outpatient Prescriptions  Medication Sig Dispense Refill  . albuterol (PROVENTIL HFA;VENTOLIN HFA) 108 (90 BASE) MCG/ACT inhaler Inhale 2 puffs into the lungs 4 (four) times daily as needed for wheezing. 1 Inhaler 5  . aspirin 81 MG tablet Take 81 mg by mouth daily.      . carvedilol (COREG) 6.25 MG tablet Take 6.25 mg by mouth 2 (two) times daily with a meal.    . ENTRESTO 24-26 MG TAKE 1 TABLET BY MOUTH 2 TIMES DAILY. 60 tablet 3  . fish oil-omega-3 fatty acids 1000 MG capsule Take  1 g by mouth daily.     . Glucosamine-Chondroit-Vit C-Mn (GLUCOSAMINE 1500 COMPLEX PO) Take 1 tablet by mouth daily.      . Lisdexamfetamine Dimesylate (VYVANSE PO) Take 70 mg by mouth daily.     Marland Kitchen LORazepam (ATIVAN) 0.5 MG tablet Take 0.5 mg by mouth as needed for anxiety.    . sildenafil (VIAGRA) 50 MG tablet Take 1 tablet (50 mg total) by mouth daily as needed for erectile dysfunction. 10 tablet 2  . spironolactone (ALDACTONE) 25 MG tablet Take 1 tablet (25 mg total) by mouth daily. 90 tablet 3  . Turmeric 500 MG CAPS Take 1,500 tablets by mouth daily.     No  current facility-administered medications for this encounter.   BP 106/54 mmHg  Pulse 67  Wt 213 lb 6.4 oz (96.798 kg)  SpO2 98% General: NAD Neck: No JVD, no thyromegaly or thyroid nodule.  Lungs: Clear to auscultation bilaterally with normal respiratory effort. CV: Nondisplaced PMI.  Heart regular S1/S2, no S3/S4, no murmur.  No peripheral edema.  No carotid bruit.  Normal pedal pulses.  Abdomen: Soft, nontender, no hepatosplenomegaly, no distention.  Skin: Intact without lesions or rashes.  Neurologic: Alert and oriented x 3.  Psych: Normal affect. Extremities: No clubbing or cyanosis.   Assessment/Plan:  1. Nonischemic Cardiomyopathy: I reviewed today's echo.  This showed stably impaired LV systolic function, EF 67%.  NYHA class II symptoms currently.  No coronary disease on cath in 2012.  No delayed enhancment on MRI in 2012.  He had chemo for testicular cancer in 1990s but this does not appear to have been a cardiotoxic regimen.  He has been a moderate to heavy consumer of ETOH, but does not seem to have been to such an extent to explain all the cardiomyopathy, and EF did not improve with 1 year of ETOH abstinence.  No family history of cardiomyopathy.  Possible prior viral myocarditis. He looks euvolemic.  - ECG shows LBBB, last QRS width about 150 msec.  NYHA class II symptoms.  I talked to Dr Caryl Comes today and will plan for CRT upgrade. - Continue Coreg and Entresto at current doses.  - No Lasix needed.  - Increase spironolactone to 25 mg daily.  He will need a BMET/BNP in 2 wks.   2. VT: History of VT, followed by Dr Caryl Comes.  - He is on Vyvanse, which is a stimulant.  We talked about this, and he says that without it his quality of life is very poor.  3. Atrial fibrillation: Paroxysmal, 1 episode noted on 05/16/14.  Never documented before.  CHADSVASC = 1 (CHF), so started on ASA.  He was also started on amiodarone and feels awful on it.  He has been on amiodarone for about a week. -  Stop amiodarone (has only been on it for about a week).  When he comes to the hospital in a few weeks for CRT upgrade, I will also start him on sotalol to try to keep him out of both atrial fibrillation and VT.  He will need to be observed (QT interval) in the hospital 48 hours after starting sotalol.  Loralie Champagne 05/26/2014

## 2014-05-26 NOTE — Progress Notes (Signed)
*  PRELIMINARY RESULTS* Echocardiogram 2D Echocardiogram has been performed.  Leavy Cella 05/26/2014, 9:06 AM

## 2014-05-30 ENCOUNTER — Telehealth: Payer: Self-pay | Admitting: *Deleted

## 2014-05-30 DIAGNOSIS — Z01812 Encounter for preprocedural laboratory examination: Secondary | ICD-10-CM

## 2014-05-30 NOTE — Telephone Encounter (Signed)
lmtcb  (need to arrange CRT upgrade and Sotalol initiation)

## 2014-06-06 NOTE — Telephone Encounter (Signed)
Follow Up   Pt is returning your call. . Please call.

## 2014-06-09 ENCOUNTER — Other Ambulatory Visit (HOSPITAL_COMMUNITY): Payer: 59

## 2014-06-10 NOTE — Telephone Encounter (Signed)
F/u   Pt returning your call. Please call

## 2014-06-10 NOTE — Telephone Encounter (Signed)
Discussed scheduling device upgrade on 6/20, along with Sotalol initiation. Patient is agreeable and aware I will discuss with pharmacist tomorrow that Sotalol admission dates will work. He is aware I will call him tomorrow to finalize details.

## 2014-06-12 ENCOUNTER — Encounter: Payer: Self-pay | Admitting: *Deleted

## 2014-06-12 NOTE — Telephone Encounter (Signed)
Scheduled patient for CRT-D upgrade 6/27. Pre procedure lab 6/20. Wound check 7/11. Notified pharmacist, [name], about pt having Sotalol loading while in hospital. Patient aware she may contact him to discuss this. Patient verbalized understanding and agreeable to plan.

## 2014-06-12 NOTE — Telephone Encounter (Signed)
Following up:    Pt would like a call back about  Scheduling  his device upgrade please.

## 2014-06-23 ENCOUNTER — Other Ambulatory Visit (INDEPENDENT_AMBULATORY_CARE_PROVIDER_SITE_OTHER): Payer: 59 | Admitting: *Deleted

## 2014-06-23 DIAGNOSIS — Z01812 Encounter for preprocedural laboratory examination: Secondary | ICD-10-CM

## 2014-06-23 LAB — BASIC METABOLIC PANEL
BUN: 15 mg/dL (ref 6–23)
CO2: 29 mEq/L (ref 19–32)
Calcium: 9.1 mg/dL (ref 8.4–10.5)
Chloride: 102 mEq/L (ref 96–112)
Creatinine, Ser: 1.21 mg/dL (ref 0.40–1.50)
GFR: 68.82 mL/min (ref >60.00)
Glucose, Bld: 78 mg/dL (ref 70–99)
Potassium: 4.1 mEq/L (ref 3.5–5.1)
Sodium: 134 mEq/L — ABNORMAL LOW (ref 135–145)

## 2014-06-23 LAB — CBC WITH DIFFERENTIAL/PLATELET
Basophils Absolute: 0 10*3/uL (ref 0.0–0.1)
Basophils Relative: 0.6 % (ref 0.0–3.0)
Eosinophils Absolute: 0.4 10*3/uL (ref 0.0–0.7)
Eosinophils Relative: 8.2 % — ABNORMAL HIGH (ref 0.0–5.0)
HCT: 40.5 % (ref 39.0–52.0)
Hemoglobin: 13.8 g/dL (ref 13.0–17.0)
Lymphocytes Relative: 25.2 % (ref 12.0–46.0)
Lymphs Abs: 1.2 10*3/uL (ref 0.7–4.0)
MCHC: 33.9 g/dL (ref 30.0–36.0)
MCV: 97.5 fl (ref 78.0–100.0)
Monocytes Absolute: 0.4 10*3/uL (ref 0.1–1.0)
Monocytes Relative: 9 % (ref 3.0–12.0)
Neutro Abs: 2.7 10*3/uL (ref 1.4–7.7)
Neutrophils Relative %: 57 % (ref 43.0–77.0)
Platelets: 167 10*3/uL (ref 150.0–400.0)
RBC: 4.16 Mil/uL — ABNORMAL LOW (ref 4.22–5.81)
RDW: 13.1 % (ref 11.5–15.5)
WBC: 4.8 10*3/uL (ref 4.0–10.5)

## 2014-06-30 ENCOUNTER — Encounter (HOSPITAL_COMMUNITY)
Admission: RE | Disposition: A | Discharge status: Home / Self Care | Payer: 59 | Source: Ambulatory Visit | Attending: Internal Medicine

## 2014-06-30 ENCOUNTER — Encounter (HOSPITAL_COMMUNITY): Payer: Self-pay | Admitting: Internal Medicine

## 2014-06-30 ENCOUNTER — Inpatient Hospital Stay (HOSPITAL_COMMUNITY)
Admission: RE | Admit: 2014-06-30 | Discharge: 2014-07-02 | DRG: 243 | Disposition: A | Discharge status: Home / Self Care | Payer: 59 | Source: Ambulatory Visit | Attending: Internal Medicine | Admitting: Internal Medicine

## 2014-06-30 DIAGNOSIS — R42 Dizziness and giddiness: Secondary | ICD-10-CM | POA: Diagnosis present

## 2014-06-30 DIAGNOSIS — Z95810 Presence of automatic (implantable) cardiac defibrillator: Secondary | ICD-10-CM

## 2014-06-30 DIAGNOSIS — I429 Cardiomyopathy, unspecified: Secondary | ICD-10-CM | POA: Diagnosis not present

## 2014-06-30 DIAGNOSIS — F909 Attention-deficit hyperactivity disorder, unspecified type: Secondary | ICD-10-CM | POA: Diagnosis present

## 2014-06-30 DIAGNOSIS — I427 Cardiomyopathy due to drug and external agent: Secondary | ICD-10-CM | POA: Diagnosis present

## 2014-06-30 DIAGNOSIS — T451X5A Adverse effect of antineoplastic and immunosuppressive drugs, initial encounter: Secondary | ICD-10-CM | POA: Diagnosis present

## 2014-06-30 DIAGNOSIS — Z959 Presence of cardiac and vascular implant and graft, unspecified: Secondary | ICD-10-CM

## 2014-06-30 DIAGNOSIS — I10 Essential (primary) hypertension: Secondary | ICD-10-CM | POA: Diagnosis present

## 2014-06-30 DIAGNOSIS — I472 Ventricular tachycardia, unspecified: Secondary | ICD-10-CM

## 2014-06-30 DIAGNOSIS — I480 Paroxysmal atrial fibrillation: Secondary | ICD-10-CM | POA: Diagnosis present

## 2014-06-30 DIAGNOSIS — I447 Left bundle-branch block, unspecified: Secondary | ICD-10-CM | POA: Diagnosis present

## 2014-06-30 DIAGNOSIS — Z79899 Other long term (current) drug therapy: Secondary | ICD-10-CM

## 2014-06-30 DIAGNOSIS — I5022 Chronic systolic (congestive) heart failure: Secondary | ICD-10-CM | POA: Diagnosis not present

## 2014-06-30 DIAGNOSIS — Z7982 Long term (current) use of aspirin: Secondary | ICD-10-CM

## 2014-06-30 DIAGNOSIS — Z8547 Personal history of malignant neoplasm of testis: Secondary | ICD-10-CM

## 2014-06-30 DIAGNOSIS — J45909 Unspecified asthma, uncomplicated: Secondary | ICD-10-CM | POA: Diagnosis present

## 2014-06-30 HISTORY — DX: Cardiac murmur, unspecified: R01.1

## 2014-06-30 HISTORY — DX: Presence of automatic (implantable) cardiac defibrillator: Z95.810

## 2014-06-30 HISTORY — DX: Paroxysmal atrial fibrillation: I48.0

## 2014-06-30 HISTORY — DX: Pneumonia, unspecified organism: J18.9

## 2014-06-30 HISTORY — PX: EP IMPLANTABLE DEVICE: SHX172B

## 2014-06-30 LAB — SURGICAL PCR SCREEN
MRSA, PCR: NEGATIVE
Staphylococcus aureus: NEGATIVE

## 2014-06-30 SURGERY — BIV ICD INSERTION CRT-D
Anesthesia: LOCAL

## 2014-06-30 MED ORDER — ACETAMINOPHEN 325 MG PO TABS
325.0000 mg | ORAL_TABLET | ORAL | Status: DC | PRN
  Administered 2014-06-30 – 2014-07-01 (×4): 650 mg via ORAL
  Filled 2014-06-30 (×4): qty 2

## 2014-06-30 MED ORDER — LIDOCAINE HCL (PF) 1 % IJ SOLN
INTRAMUSCULAR | Status: AC
  Filled 2014-06-30: qty 30

## 2014-06-30 MED ORDER — LIDOCAINE HCL (PF) 1 % IJ SOLN
INTRAMUSCULAR | Status: DC | PRN
  Administered 2014-06-30: 82 mL via SUBCUTANEOUS

## 2014-06-30 MED ORDER — CEFAZOLIN SODIUM-DEXTROSE 2-3 GM-% IV SOLR
INTRAVENOUS | Status: AC
  Filled 2014-06-30: qty 50

## 2014-06-30 MED ORDER — MUPIROCIN 2 % EX OINT
TOPICAL_OINTMENT | CUTANEOUS | Status: AC
  Filled 2014-06-30: qty 22

## 2014-06-30 MED ORDER — SODIUM CHLORIDE 0.9 % IV SOLN
INTRAVENOUS | Status: DC
  Administered 2014-06-30: 06:00:00 via INTRAVENOUS

## 2014-06-30 MED ORDER — CEFAZOLIN SODIUM 1-5 GM-% IV SOLN
1.0000 g | Freq: Four times a day (QID) | INTRAVENOUS | Status: AC
  Administered 2014-06-30 – 2014-07-01 (×3): 1 g via INTRAVENOUS
  Filled 2014-06-30 (×3): qty 50

## 2014-06-30 MED ORDER — SACUBITRIL-VALSARTAN 24-26 MG PO TABS
1.0000 | ORAL_TABLET | Freq: Two times a day (BID) | ORAL | Status: DC
  Administered 2014-06-30 – 2014-07-02 (×4): 1 via ORAL
  Filled 2014-06-30 (×6): qty 1

## 2014-06-30 MED ORDER — CHLORHEXIDINE GLUCONATE 4 % EX LIQD
60.0000 mL | Freq: Once | CUTANEOUS | Status: DC
  Filled 2014-06-30: qty 60

## 2014-06-30 MED ORDER — FENTANYL CITRATE (PF) 100 MCG/2ML IJ SOLN
INTRAMUSCULAR | Status: AC
  Filled 2014-06-30: qty 2

## 2014-06-30 MED ORDER — CARVEDILOL 12.5 MG PO TABS
12.5000 mg | ORAL_TABLET | Freq: Two times a day (BID) | ORAL | Status: DC
  Administered 2014-06-30 – 2014-07-02 (×5): 12.5 mg via ORAL
  Filled 2014-06-30 (×5): qty 1

## 2014-06-30 MED ORDER — MIDAZOLAM HCL 5 MG/5ML IJ SOLN
INTRAMUSCULAR | Status: AC
  Filled 2014-06-30: qty 5

## 2014-06-30 MED ORDER — CEFAZOLIN SODIUM-DEXTROSE 2-3 GM-% IV SOLR: 2.0000 g | INTRAVENOUS | Status: DC

## 2014-06-30 MED ORDER — MUPIROCIN 2 % EX OINT
TOPICAL_OINTMENT | Freq: Two times a day (BID) | CUTANEOUS | Status: DC
  Administered 2014-06-30: 1 via NASAL
  Filled 2014-06-30: qty 22

## 2014-06-30 MED ORDER — IOHEXOL 350 MG/ML SOLN
INTRAVENOUS | Status: DC | PRN
  Administered 2014-06-30: 15 mL via INTRAVENOUS

## 2014-06-30 MED ORDER — ALBUTEROL SULFATE (2.5 MG/3ML) 0.083% IN NEBU: 2.0000 mL | INHALATION_SOLUTION | Freq: Four times a day (QID) | RESPIRATORY_TRACT | Status: DC | PRN

## 2014-06-30 MED ORDER — FENTANYL CITRATE (PF) 100 MCG/2ML IJ SOLN
INTRAMUSCULAR | Status: DC | PRN
  Administered 2014-06-30: 25 ug via INTRAVENOUS
  Administered 2014-06-30: 50 ug via INTRAVENOUS
  Administered 2014-06-30: 25 ug via INTRAVENOUS
  Administered 2014-06-30: 50 ug via INTRAVENOUS
  Administered 2014-06-30 (×4): 25 ug via INTRAVENOUS

## 2014-06-30 MED ORDER — CEFAZOLIN SODIUM-DEXTROSE 2-3 GM-% IV SOLR
INTRAVENOUS | Status: DC | PRN
  Administered 2014-06-30: 2 g via INTRAVENOUS

## 2014-06-30 MED ORDER — SOTALOL HCL 80 MG PO TABS
80.0000 mg | ORAL_TABLET | Freq: Two times a day (BID) | ORAL | Status: DC
  Administered 2014-06-30 – 2014-07-01 (×2): 80 mg via ORAL
  Filled 2014-06-30 (×4): qty 1

## 2014-06-30 MED ORDER — YOU HAVE A PACEMAKER BOOK
Freq: Once | Status: AC
  Administered 2014-06-30: 21:00:00
  Filled 2014-06-30: qty 1

## 2014-06-30 MED ORDER — ASPIRIN 81 MG PO CHEW
81.0000 mg | CHEWABLE_TABLET | Freq: Every day | ORAL | Status: DC
  Administered 2014-06-30 – 2014-07-02 (×3): 81 mg via ORAL
  Filled 2014-06-30 (×5): qty 1

## 2014-06-30 MED ORDER — SODIUM CHLORIDE 0.9 % IV SOLN
INTRAVENOUS | Status: AC
Start: 2014-06-30 — End: 2014-06-30
  Administered 2014-06-30: 12:00:00 via INTRAVENOUS

## 2014-06-30 MED ORDER — GENTAMICIN SULFATE 40 MG/ML IJ SOLN
INTRAMUSCULAR | Status: AC
  Filled 2014-06-30: qty 2

## 2014-06-30 MED ORDER — ZOLPIDEM TARTRATE 5 MG PO TABS
5.0000 mg | ORAL_TABLET | Freq: Every evening | ORAL | Status: DC | PRN
  Administered 2014-06-30 – 2014-07-01 (×2): 5 mg via ORAL
  Filled 2014-06-30 (×2): qty 1

## 2014-06-30 MED ORDER — LORAZEPAM 0.5 MG PO TABS: 0.5000 mg | ORAL_TABLET | ORAL | Status: DC | PRN

## 2014-06-30 MED ORDER — ONDANSETRON HCL 4 MG/2ML IJ SOLN: 4.0000 mg | Freq: Four times a day (QID) | INTRAMUSCULAR | Status: DC | PRN

## 2014-06-30 MED ORDER — SPIRONOLACTONE 25 MG PO TABS
25.0000 mg | ORAL_TABLET | Freq: Every day | ORAL | Status: DC
  Administered 2014-06-30 – 2014-07-01 (×2): 25 mg via ORAL
  Filled 2014-06-30 (×3): qty 1

## 2014-06-30 MED ORDER — GENTAMICIN SULFATE 40 MG/ML IJ SOLN: 80.0000 mg | INTRAMUSCULAR | Status: DC

## 2014-06-30 MED ORDER — MIDAZOLAM HCL 5 MG/5ML IJ SOLN
INTRAMUSCULAR | Status: DC | PRN
  Administered 2014-06-30: 1 mg via INTRAVENOUS
  Administered 2014-06-30 (×2): 2 mg via INTRAVENOUS
  Administered 2014-06-30 (×7): 1 mg via INTRAVENOUS

## 2014-06-30 MED ORDER — SODIUM CHLORIDE 0.9 % IR SOLN
Status: DC | PRN
  Administered 2014-06-30: 400 mL

## 2014-06-30 MED ORDER — LIDOCAINE HCL (PF) 1 % IJ SOLN
INTRAMUSCULAR | Status: AC
  Filled 2014-06-30: qty 60

## 2014-06-30 MED ORDER — LISDEXAMFETAMINE DIMESYLATE 70 MG PO CAPS
70.0000 mg | ORAL_CAPSULE | Freq: Every day | ORAL | Status: DC
  Administered 2014-07-01 – 2014-07-02 (×2): 70 mg via ORAL
  Filled 2014-06-30 (×2): qty 1

## 2014-06-30 MED ORDER — HEPARIN (PORCINE) IN NACL 2-0.9 UNIT/ML-% IJ SOLN
INTRAMUSCULAR | Status: AC
  Filled 2014-06-30: qty 500

## 2014-06-30 SURGICAL SUPPLY — 16 items
BALLN ATTAIN 80 (BALLOONS) ×2
BALLOON ATTAIN 80 (BALLOONS) ×1 IMPLANT
CABLE SURGICAL S-101-97-12 (CABLE) ×4 IMPLANT
CATH CPS DIRECT 135 DS2C020 (CATHETERS) ×2 IMPLANT
HEMOSTAT SURGICEL 2X4 FIBR (HEMOSTASIS) ×2 IMPLANT
ICD VIVA QUAD XT CRT-D DTBA1Q1 (ICD Generator) ×2 IMPLANT
KIT ESSENTIALS PG (KITS) ×2 IMPLANT
LEAD ATTAIN PERFORMA S 4598-88 (Lead) ×2 IMPLANT
LEAD CAPSURE NOVUS 5076-52CM (Lead) ×2 IMPLANT
PAD DEFIB LIFELINK (PAD) ×2 IMPLANT
SHEATH CLASSIC 7F (SHEATH) ×2 IMPLANT
SHEATH CLASSIC 9.5F (SHEATH) ×2 IMPLANT
SHIELD RADPAD SCOOP 12X17 (MISCELLANEOUS) ×2 IMPLANT
TRAY PACEMAKER INSERTION (CUSTOM PROCEDURE TRAY) ×2 IMPLANT
WIRE ACUITY WHISPER EDS 4648 (WIRE) ×4 IMPLANT
WIRE HI TORQ VERSACORE-J 145CM (WIRE) ×2 IMPLANT

## 2014-06-30 NOTE — Interval H&P Note (Signed)
ICD Criteria  Current LVEF:30% ;Obtained > or = 1 month ago and < or = 3 months ago.  NYHA Functional Classification: Class II  Heart Failure History:  Yes, Duration of heart failure since onset is > 9 months  Non-Ischemic Dilated Cardiomyopathy History:  Yes, timeframe is > 9 months  Atrial Fibrillation/Atrial Flutter:  Yes, A-Fib/A-Flutter type: Paroxysmal.  Ventricular Tachycardia History:  Yes, Hemodynamic instability present, VT Type:  SVT - Monomorphic.  Cardiac Arrest History:  No  History of Syndromes with Risk of Sudden Death:  No.  Previous ICD:  Yes, ICD Type:  Single, Reason for ICD:  Secondary, reason for secondary prevention:  Syncope with Inducible VT.  Electrophysiology Study: No.  Prior MI: No.  PPM: No.  OSA:  No  Patient Life Expectancy of >=1 year: Yes.  Anticoagulation Therapy:  Patient is on anticoagulation therapy, anticoagulation was NOT held prior to procedure.   Beta Blocker Therapy:  Yes.   Ace Inhibitor/ARB Therapy:  Yes.History and Physical Interval Note:  06/30/2014 7:01 AM  [name]  has presented today for surgery, with the diagnosis of chronic systolic chf  The various methods of treatment have been discussed with the patient and family. After consideration of risks, benefits and other options for treatment, the patient has consented to  Procedure(s): BiV ICD Insertion CRT-D (N/A) as a surgical intervention .  The patient's history has been reviewed, patient examined, no change in status, stable for surgery.  I have reviewed the patient's chart and labs.  Questions were answered to the patient's satisfaction.     [name]

## 2014-06-30 NOTE — H&P (Signed)
Patient Care Team: No Pcp Per Patient as PCP - General (General Practice)   HPI  [name] is a 44 y.o. male Seen   for ICD>>CRT updgrade  Device initially  implanted for secondary prevention. 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 Most recent ejection fraction was 25% or so.   He has had a history of symptomatic sustained ventricular tachycardia. Discussions were had concerning catheter ablation. It was elected to use sotalol but that was never initiated. He has had recurrent prolonged nonsustained ventricular tachycardia; his device has been programmed to minimize therapy for this.   He ended up with ICD shock 8/15 ATP failed to terminate VT, 24J shock failed and 35 joule shock was successful There was associated syncope  He has had PAF with RVR  Treated most recently with amiodarone with subsequent discontinuation   Plan is to initiate sotalol  He has modest DOE and fatigue    Past Medical History  Diagnosis Date  . Systolic congestive heart failure     With ejection fraction of 25%, normal coronaries on Cath 02/2010  . Hypertension   . Syncope     episode occured Feb 2011, ICD placed  . 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. Asthma   . ADHD (attention deficit hyperactivity disorder)   . ETOH abuse   . Nonischemic cardiomyopathy   . Ventricular tachycardia   . ICD (implantable cardioverter-defibrillator), single- medtronic     Past Surgical History  Procedure Laterality Date  . Orchiectomy      with abdominal lymph node dissection  . Wrist fracture surgery        Medication List    ASK your doctor about these medications        albuterol 108 (90 BASE) MCG/ACT inhaler  Commonly known as:  PROVENTIL HFA;VENTOLIN HFA  Inhale 2 puffs into the lungs 4 (four) times daily as needed for wheezing.     aspirin 81 MG tablet  Take 81 mg by mouth daily.     carvedilol 12.5 MG tablet  Commonly known as:  COREG  Take 12.5 mg  by mouth 2 (two) times daily with a meal.     ENTRESTO 24-26 MG  Generic drug:  sacubitril-valsartan  TAKE 1 TABLET BY MOUTH 2 TIMES DAILY.     fish oil-omega-3 fatty acids 1000 MG capsule  Take 1 g by mouth daily.     GLUCOSAMINE 1500 COMPLEX PO  Take 1 tablet by mouth daily.     LORazepam 0.5 MG tablet  Commonly known as:  ATIVAN  Take 0.5 mg by mouth as needed for anxiety.     sildenafil 50 MG tablet  Commonly known as:  VIAGRA  Take 1 tablet (50 mg total) by mouth daily as needed for erectile dysfunction.     spironolactone 25 MG tablet  Commonly known as:  ALDACTONE  Take 1 tablet (25 mg total) by mouth daily.     Turmeric 500 MG Caps  Take 1,500 mg by mouth daily.     VYVANSE PO  Take 70 mg by mouth daily.         No Known Allergies  Review of Systems negative except from HPI and PMH  Physical Exam BP 92/53 mmHg  Pulse 58  Temp(Src) 97.9 F (36.6 C) (Oral)  Resp 18  Ht 6\' 5"  (1.956 m)  Wt 210 lb (95.255 kg)  BMI 24.90 kg/m2  SpO2 97% Well developed and well nourished  in no acute distress HENT normal E scleral and icterus clear Neck Supple JVP flat; carotids brisk and full Clear to ausculation  *Regular rate and rhythm, no murmurs gallops or rub Soft with active bowel sounds No clubbing cyanosis none Edema Alert and oriented, grossly normal motor and sensory function Skin Warm and Dry  .labs  SR with LBBB and LAD  15/16/43  Assessment and  Plan  Nonischemic cardiomyopathy  Congestive heart failure chronic systolic  Ventricular tachycardia recurrent with ATP successful  Orthostatic lightheadedness improved  Implantable defibrillator-Medtronic  ZLBBB  Plan is for CRT upgrade given nonischemic CM and LBBB and class 2 CHF with plans for sotalol initiation

## 2014-06-30 NOTE — Op Note (Signed)
[name], Christopher NO.:  [id number]  MEDICAL RECORD NO.:  [id number]  LOCATION:  6C08C                        FACILITY:  [facility]  PHYSICIAN:  Deboraha Sprang, MD, FACCDATE OF BIRTH:  [birth year]  DATE OF PROCEDURE: DATE OF DISCHARGE:                              OPERATIVE REPORT   PREOPERATIVE DIAGNOSES:  Nonischemic cardiomyopathy, left bundle-branch block, congestive heart failure and ventricular tachycardia.  POSTOPERATIVE DIAGNOSES:  Nonischemic cardiomyopathy, left bundle-branch block, congestive heart failure and ventricular tachycardia.  PROCEDURES:  Explantation of a previously implanted device, implantation of LV lead, implantation of RA lead, pocket revision, insertion of a new high-voltage device.  DESCRIPTION OF PROCEDURE:  Following obtaining informed consent, the patient was brought to the Electrophysiology Laboratory and placed on the fluoroscopic table in supine position.  After routine prep and drape, lidocaine was infiltrated along the line of the previous incision and carried down to layer of device pocket using sharp dissection electrocautery.  The device pocket was opened.  The previous device was explanted.  At this point, attention was turned to gain access to the extrathoracic left subclavian vein, which was accomplished without difficulty without the aspiration of air or puncture of the artery.  We did have trouble moving the J-tip wire past the proximal innominate vein suggesting stenosis.  We used a Wholey wire in the straight end of the 0.3 wire to accomplish this sequentially without difficulty.  We then sequentially a 9.5-French and 7-French sheaths were placed, which passed a Saint Jude 135CS coronary sinus cannulation catheter and a Medtronic 5076 52-cm active fixation atrial lead, serial number [id number].  Under fluoroscopic guidance, we were able to cannulate the coronary sinus rapidly.  We had accessed to a high lateral  branch.  We attempted to put the Whisper wire and the lead there, it would not move easily. Venography was then obtained without occlusion.  A lower branch was identified.  We then targeted this branch.  We then attempted to deploy the lead in this branch as well as a posterior branch, both of which could be accomplished, but neither case was the lead position stable. We then tried again on this high lateral branch and were able to deploy a lead to the junction between the distal and midthird where the bipolar L-wave was not recorded.  The impedance was 780 and the threshold was less than 2 volts at 0.5 milliseconds.  There was no diaphragmatic pacing at 10 volts in the 4-coil configuration.  In the distal configuration, there was diaphragmatic stimulation.  The deployment sheath was removed.  The lead was secured following deployment of the right atrial lead where the bipolar P-wave was 4.5 with a pace impedance of 650, and the threshold of 1.4 volts at 0.5 milliseconds.  Ventricular far-field sensing of the atrial lead was noted up to a sensitivity of 1 millivolt.  The atrial lead was secured to the prepectoral fashion, then the LV deployment system was removed.  The pocket was copiously irrigated with antibiotic-containing saline solution, then enlarged caudally to allow for housing of the larger pulse generator.  Hemostasis was obtained.  The leads were then attached to [place] XT CRT-D device, serial number [id number].  Through the device, the bipolar P-wave was 1.8 with a pace impedance of 456, a threshold of 0.75 at 0.4. The R-wave was 7.1 with a pace impedance of 361, a threshold of 0.5 at 0.4 and the LV impedance in the 4-coil configuration was 361 with a threshold of 0.5 volts at 0.4 millisecond.  High-voltage impedances were 55 and 44 ohms respectively.  The leads and the pulse generator were placed in the pocket.  The pocket was copiously irrigated.  Surgicel  was placed in the lateral and cephalad aspect of the pocket and the device was secured to the prepectoral fascia.  The wound was then closed in two layers in normal fashion. The wound was washed, dried and a benzoin, Steri-Strip dressing were applied.  Needle counts, sponge counts, and instrument counts were correct at the end of the procedure according to the staff.  The patient tolerated the procedure without apparent complication.     Deboraha Sprang, MD, [facility]     SCK/MEDQ  D:  06/30/2014  T:  06/30/2014  Job:  [id number]

## 2014-06-30 NOTE — Progress Notes (Signed)
UR COMPLETED  

## 2014-07-01 ENCOUNTER — Inpatient Hospital Stay (HOSPITAL_COMMUNITY): Payer: 59

## 2014-07-01 LAB — BASIC METABOLIC PANEL
Anion gap: 6 (ref 5–15)
BUN: 15 mg/dL (ref 6–20)
CO2: 27 mmol/L (ref 22–32)
Calcium: 8.8 mg/dL — ABNORMAL LOW (ref 8.9–10.3)
Chloride: 103 mmol/L (ref 101–111)
Creatinine, Ser: 1.18 mg/dL (ref 0.61–1.24)
GFR calc Af Amer: >60 mL/min (ref >60)
GFR calc non Af Amer: >60 mL/min (ref >60)
Glucose, Bld: 115 mg/dL — ABNORMAL HIGH (ref 65–99)
Potassium: 3.9 mmol/L (ref 3.5–5.1)
Sodium: 136 mmol/L (ref 135–145)

## 2014-07-01 MED ORDER — SOTALOL HCL 80 MG PO TABS
80.0000 mg | ORAL_TABLET | Freq: Two times a day (BID) | ORAL | Status: DC
  Administered 2014-07-01 (×2): 80 mg via ORAL
  Filled 2014-07-01 (×2): qty 1

## 2014-07-01 MED FILL — Lidocaine HCl Local Preservative Free (PF) Inj 1%: INTRAMUSCULAR | Qty: 60 | Status: AC

## 2014-07-01 MED FILL — Heparin Sodium (Porcine) 2 Unit/ML in Sodium Chloride 0.9%: INTRAMUSCULAR | Qty: 500 | Status: AC

## 2014-07-01 NOTE — Progress Notes (Signed)
    SUBJECTIVE: The patient is doing well today.  At this time, he denies chest pain, shortness of breath, or any new concerns.  S/p CRTD upgrade 06/20/14.  Sotalol load started last night.  BMET pending this morning  CURRENT MEDICATIONS: . aspirin  81 mg Oral Daily  . carvedilol  12.5 mg Oral BID WC  . lisdexamfetamine  70 mg Oral Daily  . sacubitril-valsartan  1 tablet Oral BID  . sotalol  80 mg Oral Q12H  . spironolactone  25 mg Oral Daily      OBJECTIVE: Physical Exam: Filed Vitals:   06/30/14 1745 06/30/14 2033 07/01/14 0103 07/01/14 0616  BP: 99/50 108/54 100/59 110/61  Pulse:  59 63 58  Temp:  98.2 F (36.8 C) 98 F (36.7 C) 97.4 F (36.3 C)  TempSrc:  Oral Oral Oral  Resp: 14 16 18 20   Height:      Weight:   211 lb 10.3 oz (96 kg)   SpO2:  96% 94% 94%    Intake/Output Summary (Last 24 hours) at 07/01/14 0719 Last data filed at 07/01/14 0617  Gross per 24 hour  Intake    490 ml  Output    600 ml  Net   -110 ml    Telemetry reveals AV pacing  GEN- The patient is well appearing, alert and oriented x 3 today.   Head- normocephalic, atraumatic Eyes-  Sclera clear, conjunctiva pink Ears- hearing intact Oropharynx- clear Neck- supple, no JVP Lymph- no cervical lymphadenopathy Lungs- Clear to ausculation bilaterally, normal work of breathing Heart- Regular rate and rhythm, no murmurs, rubs or gallops  GI- soft, NT, ND, + BS Extremities- no clubbing, cyanosis, or edema Skin- no rash or lesion, left chest without hematoma/ecchymosis Psych- euthymic mood, full affect Neuro- strength and sensation are intact  RADIOLOGY: Dg Chest 2 View 07/01/2014   CLINICAL DATA:  Pain after AICD insertion  EXAM: CHEST  2 VIEW  COMPARISON:  05/16/2014  FINDINGS: There are intact appearances of the transvenous leads. There is mild unchanged cardiomegaly. The lungs are clear except for minimal linear basilar opacities which are accentuated due to a shallow degree of inspiration.  Pulmonary vasculature is normal. There is no pneumothorax.  IMPRESSION: Mild unchanged cardiomegaly.  No acute cardiopulmonary findings.   Electronically Signed   By: Andreas Newport M.D.   On: 07/01/2014 07:05    ASSESSMENT AND PLAN:  Active Problems:   Ventricular tachycardia  1.  Non ischemic cardiomyopathy/chronic systolic heart failure S/p CRTD upgrade 06/30/14 CXR without ptx Device interrogation normal this morning  2.  Ventricular tachycardia Was previously on amiodarone Sotalol initiated last night BMET pending this morning QTc stable Will monitor in hospital another 24 hours with Sotalol initiation and plan DC tomorrow. The patient would like for his home dosing schedule to be 11AM and 11PM.   Chanetta Marshall, NP 07/01/2014 7:22 AM    EP Attending  Patient seen and examined. Agree with above. He will remain on tele today for initiation of sotalol. Plan to discharge tomorrow with usual followup. His device is working normally.   Mikle Bosworth.D.

## 2014-07-01 NOTE — Discharge Instructions (Signed)
° ° °  Supplemental Discharge Instructions for  Pacemaker/Defibrillator Patients  Activity No heavy lifting or vigorous activity with your left/right arm for 6 to 8 weeks.  Do not raise your left/right arm above your head for one week.  Gradually raise your affected arm as drawn below.           __      07/06/14                       07/07/14                         07/08/14                  07/09/14  NO DRIVING for   1 week  ; you may begin driving on  04/12/15  .  WOUND CARE - Keep the wound area clean and dry.  Do not get this area wet for one week. No showers for one week; you may shower on 07/09/14    . - The tape/steri-strips on your wound will fall off; do not pull them off.  No bandage is needed on the site.  DO  NOT apply any creams, oils, or ointments to the wound area. - If you notice any drainage or discharge from the wound, any swelling or bruising at the site, or you develop a fever > 101? F after you are discharged home, call the office at once.  Special Instructions - You are still able to use cellular telephones; use the ear opposite the side where you have your pacemaker/defibrillator.  Avoid carrying your cellular phone near your device. - When traveling through airports, show security personnel your identification card to avoid being screened in the metal detectors.  Ask the security personnel to use the hand wand. - Avoid arc welding equipment, MRI testing (magnetic resonance imaging), TENS units (transcutaneous nerve stimulators).  Call the office for questions about other devices. - Avoid electrical appliances that are in poor condition or are not properly grounded. - Microwave ovens are safe to be near or to operate.  Additional information for defibrillator patients should your device go off: - If your device goes off ONCE and you feel fine afterward, notify the device clinic nurses. - If your device goes off ONCE and you do not feel well afterward, call 911. - If your device  goes off TWICE, call 911. - If your device goes off THREE times in one day, call 911.  DO NOT DRIVE YOURSELF OR A FAMILY MEMBER WITH A DEFIBRILLATOR TO THE HOSPITAL--CALL 911.

## 2014-07-01 NOTE — Discharge Summary (Signed)
ELECTROPHYSIOLOGY PROCEDURE DISCHARGE SUMMARY    Patient ID: [name],  MRN: [id number], DOB/AGE: 45-Feb-1972 45 y.o.  Admit date: 06/30/2014 Discharge date: 07/02/2014  Primary Care Physician: No PCP Per Patient Primary Cardiologist: Aundra Dubin Electrophysiologist: Caryl Comes  Primary Discharge Diagnosis:  Chronic systolic heart failure and LBBB status post CRTD upgrade this admission VT status post Sotalol loading this admission  Secondary Discharge Diagnosis:  1.  Paroxysmal atrial fibrillation 2.  ADD 3.  Asthma 4.  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  Upgrade of a previously implanted single chamber ICD to a MDT CRTD on 06/30/14 by Dr Caryl Comes.  See op note for full details.  DFT's were deferred at time of implant. 2.  CXR on 07/01/14 demonstrated no pneumothorax status post device implantation.   Brief HPI: [name] is a 44 y.o. male with a past medical history as outlined above.  He has previously undergone single chamber ICD implantation with worsening heart failure.   Risks, benefits, and alternatives to CRTD upgrade were reviewed with the patient who wished to proceed.   Hospital Course:  The patient was admitted and underwent implantation of a MDT CRTD with details as outlined above. He was monitored on telemetry overnight which demonstrated AV pacing.  Because of ventricular arrhythmias, Sotalol was also initiated this admission.  BMET and QTc remained stable.  Left chest was without hematoma or ecchymosis.  The device was interrogated and found to be functioning normally.  CXR was obtained and demonstrated no pneumothorax status post device implantation.  Wound care, arm mobility, and restrictions were reviewed with the patient.  The patient was examined and considered stable for discharge to home.   The patient's discharge medications include an ARB (Valsartan) and beta blocker (Coreg).    Physical Exam: Filed Vitals:   07/01/14 1534 07/01/14 2030 07/02/14 0047 07/02/14 0636  BP: 125/78 120/61 94/45 99/59   Pulse: 65 67 60 56  Temp: 97.9 F (36.6 C) 98.4 F (36.9 C) 98.2 F (36.8 C) 97.7 F (36.5 C)  TempSrc: Oral Oral Oral Oral  Resp: 18 16 18 20   Height:      Weight:   211 lb 6.7 oz (95.9 kg)   SpO2: 98% 94% 95% 98%    GEN- The patient is well appearing, alert and oriented x 3 today.   HEENT: normocephalic, atraumatic; sclera clear, conjunctiva pink; hearing intact; oropharynx clear; neck supple, no JVP Lymph- no cervical lymphadenopathy Lungs- Clear to ausculation bilaterally, normal work of breathing.  No wheezes, rales, rhonchi Heart- Regular rate and rhythm, no murmurs, rubs or gallops, PMI not laterally displaced GI- soft, non-tender, non-distended, bowel sounds present, no hepatosplenomegaly Extremities- no clubbing, cyanosis, or edema; DP/PT/radial pulses 2+ bilaterally MS- no significant deformity or atrophy Skin- warm and dry, no rash or lesion, left chest without hematoma/ecchymosis Psych- euthymic mood, full affect Neuro- strength and sensation are intact Wound with out hematoma  CXR  Lead stable   Labs:   Lab Results  Component Value Date   WBC 4.8 06/23/2014   HGB 13.8 06/23/2014   HCT 40.5 06/23/2014   MCV 97.5 06/23/2014   PLT 167.0 06/23/2014     Recent Labs Lab 07/02/14 0333  NA 141  K 3.9  CL 105  CO2 26  BUN 12  CREATININE 1.12  CALCIUM 9.0  GLUCOSE 109*    Discharge Medications:    Medication List  ASK your doctor about these medications        albuterol 108 (90 BASE) MCG/ACT inhaler  Commonly known as:  PROVENTIL HFA;VENTOLIN HFA  Inhale 2 puffs into the lungs 4 (four) times daily as needed for wheezing.     aspirin 81 MG tablet  Take 81 mg by mouth daily.     carvedilol 12.5 MG tablet  Commonly known as:  COREG  Take 12.5 mg by mouth 2 (two) times daily with a meal.     ENTRESTO 24-26 MG  Generic  drug:  sacubitril-valsartan  TAKE 1 TABLET BY MOUTH 2 TIMES DAILY.     fish oil-omega-3 fatty acids 1000 MG capsule  Take 1 g by mouth daily.     GLUCOSAMINE 1500 COMPLEX PO  Take 1 tablet by mouth daily.     LORazepam 0.5 MG tablet  Commonly known as:  ATIVAN  Take 0.5 mg by mouth as needed for anxiety.     sildenafil 50 MG tablet  Commonly known as:  VIAGRA  Take 1 tablet (50 mg total) by mouth daily as needed for erectile dysfunction.     spironolactone 25 MG tablet  Commonly known as:  ALDACTONE  Take 1 tablet (25 mg total) by mouth daily.     Turmeric 500 MG Caps  Take 1,500 mg by mouth daily.     VYVANSE PO  Take 70 mg by mouth daily.        Disposition:   Follow-up Information    Follow up with [facility] On 07/14/2014.   Why:  at 10:30 am for wound check, EKG and labs   Contact information:   [address] [place] [address]       Duration of Discharge Encounter: Greater than 30 minutes including physician time.  Signed, Chanetta Marshall, NP 07/02/2014 7:02 AM  fior discharge  isntructions reviewed Wound check appt clarifed

## 2014-07-02 LAB — BASIC METABOLIC PANEL
Anion gap: 10 (ref 5–15)
BUN: 12 mg/dL (ref 6–20)
CO2: 26 mmol/L (ref 22–32)
Calcium: 9 mg/dL (ref 8.9–10.3)
Chloride: 105 mmol/L (ref 101–111)
Creatinine, Ser: 1.12 mg/dL (ref 0.61–1.24)
GFR calc Af Amer: >60 mL/min (ref >60)
GFR calc non Af Amer: >60 mL/min (ref >60)
Glucose, Bld: 109 mg/dL — ABNORMAL HIGH (ref 65–99)
Potassium: 3.9 mmol/L (ref 3.5–5.1)
Sodium: 141 mmol/L (ref 135–145)

## 2014-07-02 MED ORDER — SOTALOL HCL 80 MG PO TABS: 80.0000 mg | ORAL_TABLET | Freq: Two times a day (BID) | ORAL | Status: DC

## 2014-07-14 ENCOUNTER — Other Ambulatory Visit: Payer: Self-pay | Admitting: Nurse Practitioner

## 2014-07-14 ENCOUNTER — Ambulatory Visit (INDEPENDENT_AMBULATORY_CARE_PROVIDER_SITE_OTHER): Payer: 59 | Admitting: *Deleted

## 2014-07-14 ENCOUNTER — Encounter: Payer: Self-pay | Admitting: Internal Medicine

## 2014-07-14 ENCOUNTER — Other Ambulatory Visit (HOSPITAL_COMMUNITY): Payer: Self-pay | Admitting: Cardiology

## 2014-07-14 ENCOUNTER — Other Ambulatory Visit (INDEPENDENT_AMBULATORY_CARE_PROVIDER_SITE_OTHER): Payer: 59 | Admitting: *Deleted

## 2014-07-14 DIAGNOSIS — I472 Ventricular tachycardia, unspecified: Secondary | ICD-10-CM

## 2014-07-14 DIAGNOSIS — I4729 Other ventricular tachycardia: Secondary | ICD-10-CM

## 2014-07-14 DIAGNOSIS — I4891 Unspecified atrial fibrillation: Secondary | ICD-10-CM

## 2014-07-14 DIAGNOSIS — I5022 Chronic systolic (congestive) heart failure: Secondary | ICD-10-CM

## 2014-07-14 DIAGNOSIS — Z95810 Presence of automatic (implantable) cardiac defibrillator: Secondary | ICD-10-CM

## 2014-07-14 LAB — CUP PACEART INCLINIC DEVICE CHECK
Battery Remaining Longevity: 113 mo
Battery Voltage: 3.07 V
Brady Statistic AP VP Percent: 0.56 %
Brady Statistic AP VS Percent: 0.05 %
Brady Statistic AS VP Percent: 97.92 %
Brady Statistic AS VS Percent: 1.46 %
Brady Statistic RA Percent Paced: 0.62 %
Brady Statistic RV Percent Paced: 0.23 %
Date Time Interrogation Session: 20160711115633
HighPow Impedance: 19 Ohm
HighPow Impedance: 40 Ohm
HighPow Impedance: 51 Ohm
Lead Channel Impedance Value: 361 Ohm
Lead Channel Impedance Value: 361 Ohm
Lead Channel Pacing Threshold Amplitude: 0.5 V
Lead Channel Pacing Threshold Amplitude: 0.5 V
Lead Channel Pacing Threshold Amplitude: 0.75 V
Lead Channel Pacing Threshold Pulse Width: 0.4 ms
Lead Channel Pacing Threshold Pulse Width: 0.4 ms
Lead Channel Pacing Threshold Pulse Width: 0.4 ms
Lead Channel Sensing Intrinsic Amplitude: 2 mV
Lead Channel Sensing Intrinsic Amplitude: 6.625 mV
Lead Channel Setting Pacing Amplitude: 1.75 V
Lead Channel Setting Pacing Amplitude: 2 V
Lead Channel Setting Pacing Amplitude: 3.5 V
Lead Channel Setting Pacing Pulse Width: 0.4 ms
Lead Channel Setting Pacing Pulse Width: 0.4 ms
Lead Channel Setting Sensing Sensitivity: 0.45 mV
Zone Setting Detection Interval: 270 ms
Zone Setting Detection Interval: 350 ms
Zone Setting Detection Interval: 350 ms
Zone Setting Detection Interval: 400 ms

## 2014-07-14 LAB — BASIC METABOLIC PANEL
BUN: 15 mg/dL (ref 6–23)
CO2: 30 mEq/L (ref 19–32)
Calcium: 8.9 mg/dL (ref 8.4–10.5)
Chloride: 103 mEq/L (ref 96–112)
Creatinine, Ser: 1.21 mg/dL (ref 0.40–1.50)
GFR: 68.8 mL/min (ref >60.00)
Glucose, Bld: 96 mg/dL (ref 70–99)
Potassium: 4 mEq/L (ref 3.5–5.1)
Sodium: 138 mEq/L (ref 135–145)

## 2014-07-14 NOTE — Progress Notes (Signed)
CRTD Wound check appointment. Steri-strips removed. Wound without redness or edema. Incision edges approximated, wound well healed. Normal device function. Thresholds, sensing, and impedances consistent with implant measurements. Device programmed at 3.5V/ autocapture on for extra safety margin until 3 month visit. Histogram distribution appropriate for patient and level of activity. No mode switches or ventricular arrhythmias noted. Patient educated about wound care, arm mobility, lifting restrictions, shock plan. ROV in 3 months with SK. EKG s/p sotalol inititiation- QT 454ms, QRS 145ms. BMET today.

## 2014-07-14 NOTE — Addendum Note (Signed)
Addended by: Eulis Foster on: 07/14/2014 10:51 AM   Modules accepted: Orders

## 2014-07-14 NOTE — Addendum Note (Signed)
Addended by: Eulis Foster on: 07/14/2014 10:52 AM   Modules accepted: Orders

## 2014-07-24 ENCOUNTER — Encounter: Payer: Self-pay | Admitting: Internal Medicine

## 2014-07-24 NOTE — Telephone Encounter (Signed)
Called patient and lmtcb to discuss complaint

## 2014-08-06 NOTE — Telephone Encounter (Signed)
Spoke with patient via telephone.  He returns my call from 7/21. Patient tells me that he is having positional light-headedness.  Asking about reducing BB dosage, as this started after BB therapy initiated. Patient is unable to measure BPs.  He will borrow BP cuff, take BPs over weekend, and call me next week to let me know how his pressure is measuring.  We will then review with Dr. Caryl Comes recommendations. Patient verbalized understanding and agreeable to plan.

## 2014-08-11 ENCOUNTER — Encounter: Payer: Self-pay | Admitting: Internal Medicine

## 2014-10-01 ENCOUNTER — Other Ambulatory Visit: Payer: Self-pay | Admitting: Nurse Practitioner

## 2014-10-03 ENCOUNTER — Encounter: Payer: Self-pay | Admitting: Internal Medicine

## 2014-10-03 ENCOUNTER — Ambulatory Visit (INDEPENDENT_AMBULATORY_CARE_PROVIDER_SITE_OTHER): Payer: 59 | Admitting: Internal Medicine

## 2014-10-03 VITALS — BP 120/80 | HR 67 | Ht 77.0 in | Wt 211.4 lb

## 2014-10-03 DIAGNOSIS — I429 Cardiomyopathy, unspecified: Secondary | ICD-10-CM | POA: Diagnosis not present

## 2014-10-03 DIAGNOSIS — I5022 Chronic systolic (congestive) heart failure: Secondary | ICD-10-CM

## 2014-10-03 DIAGNOSIS — Z79899 Other long term (current) drug therapy: Secondary | ICD-10-CM | POA: Diagnosis not present

## 2014-10-03 DIAGNOSIS — I472 Ventricular tachycardia, unspecified: Secondary | ICD-10-CM

## 2014-10-03 DIAGNOSIS — I428 Other cardiomyopathies: Secondary | ICD-10-CM

## 2014-10-03 DIAGNOSIS — I4729 Other ventricular tachycardia: Secondary | ICD-10-CM

## 2014-10-03 NOTE — Progress Notes (Signed)
Patient Care Team: No Pcp Per Patient as PCP - General (General Practice)   HPI  [name] is a 44 y.o. male Seen in followup for ICD implanted for secondary prevention.  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 Most recent ejection fraction was 25% or so.   He has had a history of symptomatic sustained ventricular tachycardia. Discussions were had concerning catheter ablation. It was elected to use sotalol but that was never initiated. He has had recurrent prolonged nonsustained ventricular tachycardia; his device has been programmed to minimize therapy for this.   He ended up with ICD shock 8/15  ATP failed to terminate VT, 24J shock failed and 35 joule shock was successful  There was associated syncope  Functionally quite stable; he was referred to the heart failure clinic where he was seen in December. Cardiopulmonary stress testing was recommended  But not consummated secondary     ECG 5/15 demonstrated left bundle branch block with a QRS duration of 150 ms  He underwent CRT upgrade 6/15          Past Medical History  Diagnosis Date  . Systolic congestive heart failure     With ejection fraction of 25%, normal coronaries on Cath 02/2010  . Syncope     episode occured Feb 2011, ICD placed  . Asthma   . ADHD (attention deficit hyperactivity disorder)   . ETOH abuse   . Nonischemic cardiomyopathy   . Ventricular tachycardia   . AICD (automatic cardioverter/defibrillator) present 06/30/2014    BiV ICD Insertion CRT-D  . PAF (paroxysmal atrial fibrillation)   . Heart murmur   . Pneumonia ~ 01/2014  . 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1998    "right"    Past Surgical History  Procedure Laterality Date  . Orchiectomy Right 1998    with abdominal lymph node dissection  . Wrist fracture surgery    . Ep implantable device N/A 06/30/2014    Procedure: BiV ICD Insertion CRT-D;  Surgeon: Deboraha Sprang, MD;  Location: [facility];   Service: Cardiovascular;  Laterality: N/A;  . Cardiac defibrillator placement  2014    "single lead; Medtronic"  . Fracture surgery Left ~ 1988    Current Outpatient Prescriptions  Medication Sig Dispense Refill  . albuterol (PROVENTIL HFA;VENTOLIN HFA) 108 (90 BASE) MCG/ACT inhaler Inhale 2 puffs into the lungs 4 (four) times daily as needed for wheezing. 1 Inhaler 5  . aspirin 81 MG tablet Take 81 mg by mouth daily.      . carvedilol (COREG) 12.5 MG tablet Take 12.5 mg by mouth 2 (two) times daily with a meal.    . fish oil-omega-3 fatty acids 1000 MG capsule Take 1 g by mouth daily.     . Glucosamine-Chondroit-Vit C-Mn (GLUCOSAMINE 1500 COMPLEX PO) Take 1 tablet by mouth daily.      . Lisdexamfetamine Dimesylate (VYVANSE PO) Take 70 mg by mouth daily.     Marland Kitchen LORazepam (ATIVAN) 0.5 MG tablet Take 0.5 mg by mouth as needed for anxiety.    . sildenafil (VIAGRA) 50 MG tablet Take 1 tablet (50 mg total) by mouth daily as needed for erectile dysfunction. 10 tablet 2  . sotalol (BETAPACE) 80 MG tablet TAKE 1 TABLET BY MOUTH EVERY 12 HOURS. 60 tablet 2  . spironolactone (ALDACTONE) 25 MG tablet Take 1 tablet (25 mg total) by mouth daily. 90 tablet 3  . Turmeric 500 MG CAPS Take 1,500 mg  by mouth daily.     Marland Kitchen ENTRESTO 24-26 MG TAKE 1 TABLET BY MOUTH 2 TIMES DAILY. (Patient not taking: Reported on 10/03/2014) 60 tablet 3   No current facility-administered medications for this visit.   Social History   Social History  . Marital Status: Married    Spouse Name: N/A  . Number of Children: N/A  . Years of Education: N/A   Occupational History  . Part time Programme researcher, broadcasting/film/video    Social History Main Topics  . Smoking status: Passive Smoke Exposure - Never Smoker  . 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 Alcohol Use: 16.8 oz/week    0 Standard drinks or equivalent, 7 Glasses of wine, 21 Cans of beer per week     Comment:  06/30/2014 "2-6 beer2 or glasses of wine/day; probably 2/3 of these beers"  . Drug Use: No  . Sexual Activity: Not Currently   Other Topics Concern  . Not on file   Social History Narrative     No Known Allergies  Review of Systems negative except from HPI and PMH  Physical Exam BP 120/80 mmHg  Pulse 67  Ht 6\' 5"  (1.956 m)  Wt 211 lb 6.4 oz (95.89 kg)  BMI 25.06 kg/m2 Well developed and well nourished in no acute distress HENT normal E scleral and icterus clear Neck Supple JVP flat; carotids brisk and full. Device pocket well healed; without hematoma or erythema.  There is no tethering  Clear to ausculation  Regular rate and rhythm, no murmurs gallops or rub Soft with active bowel sounds No clubbing cyanosis  Edema Alert and oriented, grossly normal motor and sensory function Skin Warm and Dry  ECG demonstrates P synchronous pacing with a negative QRS in lead V1 and a positive QRS in lead 1  Chest x-ray was reviewed.Pole 4 is  at the base.  Assessment and  Plan  Nonischemic cardiomyopathy  Congestive heart failure chronic  systolic  Ventricular tachycardia  no intercurrent  Orthostatic lightheadedness improved  Implantable defibrillator-Medtronic  High risk medication surveillance   He is significantly improved following CRT upgrade. There has been no intercurrent ventricular tachycardia. He continues to struggle with some orthostasis.  He is euvolemic and will continue current medications.  We discussed supplement medication interactions.  His electrocardiogram wants reevaluation of left ventricular pacing and we have reprogrammed him from 4-coil---2-3.    We will check a metabolic profile on his Aldactone and sotalol.

## 2014-10-03 NOTE — Patient Instructions (Addendum)
Medication Instructions:  Your physician recommends that you continue on your current medications as directed. Please refer to the Current Medication list given to you today.  Labwork: Please call office and schedule lab appointment for medication surveillance labs: BMET & Magnesiuim level  Testing/Procedures: None ordered  Follow-Up: Remote monitoring is used to monitor your Pacemaker of ICD from home. This monitoring reduces the number of office visits required to check your device to one time per year. It allows [place] to keep an eye on the functioning of your device to ensure it is working properly. You are scheduled for a device check from home on 01/10/15. You may send your transmission at any time that day. If you have a wireless device, the transmission will be sent automatically. After your physician reviews your transmission, you will receive a postcard with your next transmission date.   Your physician wants you to follow-up in: 6 months with Chanetta Marshall, NP.  You will receive a reminder letter in the mail two months in advance. If you don't receive a letter, please call our office to schedule the follow-up appointment.  Your physician wants you to follow-up in: 1 year with Dr. Caryl Comes.  You will receive a reminder letter in the mail two months in advance. If you don't receive a letter, please call our office to schedule the follow-up appointment.  Any Other Special Instructions Will Be Listed Below (If Applicable). Thank you for choosing [facility]!!

## 2014-10-08 NOTE — Progress Notes (Signed)
Unable to route PCP report secondary to no PCP per patient.

## 2014-10-09 LAB — CUP PACEART INCLINIC DEVICE CHECK
Battery Remaining Longevity: 108 mo
Battery Voltage: 3.03 V
Brady Statistic AP VP Percent: 0.86 %
Brady Statistic AP VS Percent: 0.07 %
Brady Statistic AS VP Percent: 97.58 %
Brady Statistic AS VS Percent: 1.48 %
Brady Statistic RA Percent Paced: 0.93 %
Brady Statistic RV Percent Paced: 0.32 %
Date Time Interrogation Session: 20160930131606
HighPow Impedance: 19 Ohm
HighPow Impedance: 49 Ohm
HighPow Impedance: 62 Ohm
Lead Channel Impedance Value: 399 Ohm
Lead Channel Impedance Value: 399 Ohm
Lead Channel Pacing Threshold Amplitude: 0.5 V
Lead Channel Pacing Threshold Amplitude: 0.75 V
Lead Channel Pacing Threshold Amplitude: 0.75 V
Lead Channel Pacing Threshold Pulse Width: 0.4 ms
Lead Channel Pacing Threshold Pulse Width: 0.4 ms
Lead Channel Pacing Threshold Pulse Width: 0.4 ms
Lead Channel Sensing Intrinsic Amplitude: 2.375 mV
Lead Channel Sensing Intrinsic Amplitude: 7.625 mV
Lead Channel Setting Pacing Amplitude: 1.5 V
Lead Channel Setting Pacing Amplitude: 2 V
Lead Channel Setting Pacing Amplitude: 2.5 V
Lead Channel Setting Pacing Pulse Width: 0.4 ms
Lead Channel Setting Pacing Pulse Width: 0.4 ms
Lead Channel Setting Sensing Sensitivity: 0.45 mV
Zone Setting Detection Interval: 270 ms
Zone Setting Detection Interval: 350 ms
Zone Setting Detection Interval: 350 ms
Zone Setting Detection Interval: 400 ms

## 2014-11-17 ENCOUNTER — Other Ambulatory Visit (HOSPITAL_COMMUNITY): Payer: Self-pay | Admitting: Internal Medicine

## 2014-12-12 ENCOUNTER — Other Ambulatory Visit: Payer: Self-pay | Admitting: Internal Medicine

## 2014-12-22 ENCOUNTER — Other Ambulatory Visit (HOSPITAL_COMMUNITY): Payer: Self-pay | Admitting: Cardiology

## 2015-01-02 ENCOUNTER — Other Ambulatory Visit: Payer: Self-pay | Admitting: Nurse Practitioner

## 2015-01-06 ENCOUNTER — Encounter: Payer: 59 | Admitting: *Deleted

## 2015-01-06 ENCOUNTER — Telehealth: Payer: Self-pay | Admitting: Cardiology

## 2015-01-06 NOTE — Telephone Encounter (Signed)
LMOVM reminding pt to send remote transmission.   

## 2015-01-09 ENCOUNTER — Encounter: Payer: Self-pay | Admitting: Cardiology

## 2015-01-23 MED FILL — ENTRESTO 24 MG-26 MG TABLET: 24-26 | 30 days supply | Qty: 60 | Fill #1

## 2015-01-27 MED FILL — AMPHETAMINE SALTS 10 MG TAB: 10 | 90 days supply | Qty: 90 | Fill #0

## 2015-01-30 MED FILL — VYVANSE 70 MG CAPSULE: 70 | 90 days supply | Qty: 90 | Fill #0

## 2015-02-05 MED FILL — SOTALOL 80 MG TABLET: 80 | 30 days supply | Qty: 60 | Fill #1

## 2015-02-26 MED FILL — ENTRESTO 24 MG-26 MG TABLET: 24-26 | 30 days supply | Qty: 60 | Fill #2

## 2015-03-09 MED FILL — CARVEDILOL 12.5 MG TABLET: 12.5 | 90 days supply | Qty: 180 | Fill #1

## 2015-03-09 MED FILL — SOTALOL HCL 80 MG TABLET: 80 | 30 days supply | Qty: 60 | Fill #2

## 2015-03-09 MED FILL — SPIRONOLACTONE 25 MG TABLET: 25 | 90 days supply | Qty: 90 | Fill #3

## 2015-03-31 MED FILL — ENTRESTO 24 MG-26 MG TABLET: 24-26 | 30 days supply | Qty: 60 | Fill #3

## 2015-04-13 MED FILL — SOTALOL HCL 80 MG TABLET: 80 | 30 days supply | Qty: 60 | Fill #3

## 2015-04-28 MED FILL — VYVANSE 70 MG CAPSULE: 70 | 90 days supply | Qty: 90 | Fill #0

## 2015-05-04 ENCOUNTER — Other Ambulatory Visit (HOSPITAL_COMMUNITY): Payer: Self-pay | Admitting: Cardiology

## 2015-05-05 MED FILL — ENTRESTO 24 MG-26 MG TABLET: 24-26 | 30 days supply | Qty: 60 | Fill #0

## 2015-05-15 MED FILL — SOTALOL HCL 80 MG TABLET: 80 | 30 days supply | Qty: 60 | Fill #4

## 2015-05-28 ENCOUNTER — Encounter: Payer: Self-pay | Admitting: Internal Medicine

## 2015-06-08 DIAGNOSIS — F900 Attention-deficit hyperactivity disorder, predominantly inattentive type: Secondary | ICD-10-CM | POA: Diagnosis not present

## 2015-06-08 MED FILL — ENTRESTO 24 MG-26 MG TABLET: 24-26 | 30 days supply | Qty: 60 | Fill #1

## 2015-06-15 ENCOUNTER — Other Ambulatory Visit (HOSPITAL_COMMUNITY): Payer: Self-pay | Admitting: Cardiology

## 2015-06-15 MED FILL — SOTALOL HCL 80 MG TABLET: 80 | 30 days supply | Qty: 60 | Fill #5

## 2015-06-15 MED FILL — CARVEDILOL 12.5 MG TABLET: 12.5 | 90 days supply | Qty: 180 | Fill #2

## 2015-06-16 MED FILL — SPIRONOLACTONE 25 MG TABLET: 25 | 90 days supply | Qty: 90 | Fill #0

## 2015-06-22 ENCOUNTER — Other Ambulatory Visit: Payer: Self-pay | Admitting: *Deleted

## 2015-06-23 ENCOUNTER — Other Ambulatory Visit: Payer: Self-pay

## 2015-06-23 NOTE — Telephone Encounter (Signed)
Per Dr. Caryl Comes- he will not refill. This will need to come from the patient's PCP.

## 2015-06-26 ENCOUNTER — Telehealth: Payer: Self-pay | Admitting: Internal Medicine

## 2015-06-26 ENCOUNTER — Other Ambulatory Visit: Payer: Self-pay | Admitting: *Deleted

## 2015-06-26 NOTE — Telephone Encounter (Signed)
Pharmacy requesting a refill on Viagra 50 mg tablet. Would Dr. Caryl Comes like to refill this medication? Please advise

## 2015-06-26 NOTE — Telephone Encounter (Signed)
I asked him this the other day and thought I sent it back through the refill pool, but Dr. Caryl Comes said he would not refill this.  This will need to go through his PCP.  Thanks!

## 2015-06-26 NOTE — Telephone Encounter (Signed)
[facility] pharmacy to inform them that Dr. Caryl Comes will not refill this medication and that the pt would have to get it refill with pt's PCP. Pharmacy verbalized understanding.

## 2015-07-04 NOTE — Progress Notes (Signed)
Cardiology Office Note Date:  07/06/2015  Patient ID:  [name], DOB 1970/07/18, MRN [id number] PCP:  No PCP Per Patient  Electrophysiologist: Dr. Caryl Comes   Chief Complaint: routine f/u  History of Present Illness: [name] is a 45 y.o. male with history of NICM, presumed secondary to triple chemotherapy, VT with ICD, last seen by Dr. Caryl Comes Sept 2016 who noted hx of symptomatic sustained ventricular tachycardia. Discussions were had concerning catheter ablation. It was elected to use sotalol  but that was never initiated.  He has had recurrent prolonged nonsustained ventricular tachycardia; his device has been programmed to minimize therapy for this.  He also carries hx of ETOH abuse, none for a couple years, ADHD, asthma, and CHF.    May 2016, hospital admit for PAFib, single event of AF had been noted appears at that time he was intolerant of amiodarone with side effects, and was started on Sotalol for his VT and AF, no chronic a/c was started.   He comes in today feeling well, he denies any kind of CP, palpitations or SOB, no near synope or syncope.  He has known orthostatic history and is careful upon standing, states this is controlled.  He denies any shocks from his device.  He inquires about Viagra Rx/refills.  He also mentions he is concerned about sleep apnea, he has been told he snores, not quite as much with some weight loss, though feels like he is tired during the day, feels like although he slept all night he didn't get rest, has family members that have apnea and are of normal weight and would like to be checked for apnea.  DEVICE HISTORY: ICD shock 8/15 ATP failed to terminate VT, 24J shock failed and 35 joule shock was successful There was associated syncope    Past Medical History  Diagnosis Date  . Systolic congestive heart failure ([place])     With ejection fraction of 25%, normal coronaries on Cath 02/2010  . Syncope     episode occured Feb 2011, ICD  placed  . Asthma   . ADHD (attention deficit hyperactivity disorder)   . ETOH abuse   . Nonischemic cardiomyopathy ([place])   . Ventricular tachycardia ([place])   . AICD (automatic cardioverter/defibrillator) present 06/30/2014    BiV ICD Insertion CRT-D  . PAF (paroxysmal atrial fibrillation) ([place])   . Heart murmur   . Pneumonia ~ 01/2014  . 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[facility]) 1998    "right"    Past Surgical History  Procedure Laterality Date  . Orchiectomy Right 1998    with abdominal lymph node dissection  . Wrist fracture surgery    . Ep implantable device N/A 06/30/2014    Procedure: BiV ICD Insertion CRT-D;  Surgeon: Deboraha Sprang, MD;  Location: [facility];  Service: Cardiovascular;  Laterality: N/A;  . Cardiac defibrillator placement  2014    "single lead; Medtronic"  . Fracture surgery Left ~ 1988    Current Outpatient Prescriptions  Medication Sig Dispense Refill  . albuterol (PROVENTIL HFA;VENTOLIN HFA) 108 (90 BASE) MCG/ACT inhaler Inhale 2 puffs into the lungs 4 (four) times daily as needed for wheezing. 1 Inhaler 5  . aspirin 81 MG tablet Take 81 mg by mouth daily.      . carvedilol (COREG) 12.5 MG tablet TAKE 1 TABLET BY MOUTH 2 TIMES DAILY. 180 tablet 2  . ENTRESTO 24-26 MG TAKE 1 TABLET BY MOUTH 2 TIMES DAILY. 60 tablet 3  . fish oil-omega-3  fatty acids 1000 MG capsule Take 1 g by mouth daily.     . Glucosamine-Chondroit-Vit C-Mn (GLUCOSAMINE 1500 COMPLEX PO) Take 1 tablet by mouth daily.      . Lisdexamfetamine Dimesylate (VYVANSE PO) Take 70 mg by mouth daily.     Marland Kitchen LORazepam (ATIVAN) 0.5 MG tablet Take 0.5 mg by mouth as directed.     . sildenafil (VIAGRA) 50 MG tablet Take 1 tablet (50 mg total) by mouth daily as needed for erectile dysfunction. 10 tablet 2  . sotalol (BETAPACE) 80 MG tablet TAKE 1 TABLET BY MOUTH EVERY 12 HOURS. 60 tablet 5  . spironolactone (ALDACTONE) 25 MG tablet TAKE 1 TABLET BY MOUTH ONCE DAILY 90 tablet 3  . Turmeric 500 MG CAPS Take 1,500 mg  by mouth daily.      No current facility-administered medications for this visit.    Allergies:   Review of patient's allergies indicates no known allergies.   Social History:  The patient  reports that he has been passively smoking.  He has never used smokeless tobacco. He reports that he drinks about 16.8 oz of alcohol per week. He reports that he does not use illicit drugs.   Family History:  The patient's family history includes Heart disease in his mother; Pancreatic cancer in his maternal grandfather. There is no history of Cardiomyopathy.  ROS:  Please see the history of present illness.  All other systems are reviewed and otherwise negative.   PHYSICAL EXAM:  VS:  BP 116/70 mmHg  Pulse 62  Ht 6\' 5"  (1.956 m)  Wt 212 lb (96.163 kg)  BMI 25.13 kg/m2  SpO2 95% BMI: Body mass index is 25.13 kg/(m^2). Well nourished, well developed, in no acute distress HEENT: normocephalic, atraumatic Neck: no JVD, carotid bruits or masses Cardiac:  normal S1, S2; RRR; no significant murmurs, no rubs, or gallops Lungs:  clear to auscultation bilaterally, no wheezing, rhonchi or rales Abd: soft, nontender MS: no deformity or atrophy Ext: no edema Skin: warm and dry, no rash Neuro:  No gross deficits appreciated Psych: euthymic mood, full affect  ICD site is stable, no tethering or discomfort   EKG:  Done today and reviewed by myself shows SB, 58bpm, QTc is stable Device interrogation today: normal device function, battery status is OK, one NSVT episode is an Atrial or ST, no therapies, no AMS episodes  05/26/14: Echocardiogram Study Conclusions - Left ventricle: The cavity size was mildly dilated. Wall  thickness was increased in a pattern of mild LVH. The estimated  ejection fraction was 30%. Diffuse hypokinesis. Septal-lateral  dyssynchrony noted. Features are consistent with a pseudonormal  left ventricular filling pattern, with concomitant abnormal  relaxation and increased  filling pressure (grade 2 diastolic  dysfunction). - Aortic valve: There was no stenosis. - Aorta: Mildly dilated aortic root. Aortic root dimension: 41 mm  (ED). - Mitral valve: There was trivial regurgitation. - Left atrium: The atrium was mildly dilated. - Right ventricle: The cavity size was normal. Pacer wire or  catheter noted in right ventricle. Systolic function was normal. - Right atrium: The atrium was mildly dilated. - Pulmonary arteries: No complete TR doppler jet so unable to  estimate PA systolic pressure. - Inferior vena cava: The vessel was normal in size. The  respirophasic diameter changes were in the normal range (= 50%),  consistent with normal central venous pressure. Impressions: - Mildly dilated LV with mild LV hypertrophy. EF 30% with diffuse  hypokinesis and septal-lateral dyssynchrony. Normal  RV size and  systolic function. No significant valvular abnormalities.    Recent Labs: 07/14/2014: BUN 15; Creatinine, Ser 1.21; Potassium 4.0; Sodium 138  No results found for requested labs within last 365 days.   CrCl cannot be calculated (Patient has no serum creatinine result on file.).   Wt Readings from Last 3 Encounters:  07/06/15 212 lb (96.163 kg)  10/03/14 211 lb 6.4 oz (95.89 kg)  07/02/14 211 lb 6.7 oz (95.9 kg)     Other studies reviewed: Additional studies/records reviewed today include: summarized above  DEVICE information:  MDT, CRT-D, implanted 06/30/14, Dr. Caryl Comes, original device 2012 secondary prevention/CM Aug 2015 appropriate shock for VT  ASSESSMENT AND PLAN:  1. NICM/VT hx, LBBB s/p CRT-D     device functioning normally     no shocks, no VT     Exam appears euvolemic, corvue is at [facility]     On BB/ARB, aldactone     QTc stable on Sotalol      2. AFib, one known event May 2016     CHA2Ds2Vasc is 1 with LV dysfunction     no AMS events      Continue to follow with his device     Carelink remotes q3  months   Disposition: BMET today, will refer him for sleep study, Carelink remote interrogation q63mo, and Dr. Caryl Comes in 28mo, sooner if needed.  The patient was informed that Dr. Caryl Comes had declined already to refill/manage his Viagra prescription and the better doctor is his PMD or urologist.  He has neither.  He is urged that give he has medical history he should have a PMD that knows him and should get established. He tells me his hx of testicular caner is so long ago he was told he no longer needed to follow with a urologist.  Current medicines are reviewed at length with the patient today.  The patient did not have any concerns regarding medicines.  Haywood Lasso, PA-C 07/06/2015 5:21 PM     [address] [place] [id number] [phone] (office)  [phone] (fax)

## 2015-07-06 ENCOUNTER — Encounter: Payer: Self-pay | Admitting: Physician Assistant

## 2015-07-06 ENCOUNTER — Telehealth: Payer: Self-pay | Admitting: *Deleted

## 2015-07-06 ENCOUNTER — Ambulatory Visit (INDEPENDENT_AMBULATORY_CARE_PROVIDER_SITE_OTHER): Payer: 59 | Admitting: Physician Assistant

## 2015-07-06 VITALS — BP 116/70 | HR 62 | Ht 77.0 in | Wt 212.0 lb

## 2015-07-06 DIAGNOSIS — I429 Cardiomyopathy, unspecified: Secondary | ICD-10-CM

## 2015-07-06 DIAGNOSIS — R0683 Snoring: Secondary | ICD-10-CM | POA: Diagnosis not present

## 2015-07-06 NOTE — Telephone Encounter (Signed)
SPOKE TO PT

## 2015-07-06 NOTE — Patient Instructions (Addendum)
Medication Instructions:   Your physician recommends that you continue on your current medications as directed. Please refer to the Current Medication list given to you today.   If you need a refill on your cardiac medications before your next appointment, please call your pharmacy.  Labwork: NONE ORDER TODAY ( contact and spoke pt for return for bmet that was needed)    Testing/Procedures:    Your physician has recommended that you have a sleep study. This test records several body functions during sleep, including: brain activity, eye movement, oxygen and carbon dioxide blood levels, heart rate and rhythm, breathing rate and rhythm, the flow of air through your mouth and nose, snoring, body muscle movements, and chest and belly movement.      Follow-Up:  Your physician wants you to follow-up in:  IN  [place] Kitchen You will receive a reminder letter in the mail two months in advance. If you don't receive a letter, please call our office to schedule the follow-up appointment.      Any Other Special Instructions Will Be Listed Below (If Applicable).

## 2015-07-06 NOTE — Telephone Encounter (Signed)
[facility] CELL AND  HOUSE #

## 2015-07-08 ENCOUNTER — Other Ambulatory Visit (INDEPENDENT_AMBULATORY_CARE_PROVIDER_SITE_OTHER): Payer: 59 | Admitting: *Deleted

## 2015-07-08 DIAGNOSIS — I429 Cardiomyopathy, unspecified: Secondary | ICD-10-CM | POA: Diagnosis not present

## 2015-07-08 LAB — BASIC METABOLIC PANEL
BUN: 15 mg/dL (ref 7–25)
CO2: 27 mmol/L (ref 20–31)
Calcium: 9.2 mg/dL (ref 8.6–10.3)
Chloride: 103 mmol/L (ref 98–110)
Creat: 1.17 mg/dL (ref 0.60–1.35)
Glucose, Bld: 91 mg/dL (ref 65–99)
Potassium: 4.3 mmol/L (ref 3.5–5.3)
Sodium: 140 mmol/L (ref 135–146)

## 2015-07-10 ENCOUNTER — Telehealth: Payer: Self-pay | Admitting: *Deleted

## 2015-07-10 MED FILL — ENTRESTO 24 MG-26 MG TABLET: 24-26 | 30 days supply | Qty: 60 | Fill #2

## 2015-07-10 NOTE — Telephone Encounter (Signed)
-----   Message from [facility], [place] sent at 07/08/2015  6:04 PM EDT ----- Please let the patient know his lab looks good, no changes.  Thanks [employer]

## 2015-07-13 ENCOUNTER — Telehealth: Payer: Self-pay | Admitting: *Deleted

## 2015-07-13 NOTE — Telephone Encounter (Signed)
-----   Message from [facility], [place] sent at 07/08/2015  6:04 PM EDT ----- Please let the patient know his lab looks good, no changes.  Thanks [employer]

## 2015-07-15 ENCOUNTER — Telehealth: Payer: Self-pay | Admitting: Physician Assistant

## 2015-07-15 NOTE — Telephone Encounter (Signed)
error 

## 2015-07-16 ENCOUNTER — Telehealth: Payer: Self-pay | Admitting: *Deleted

## 2015-07-16 NOTE — Telephone Encounter (Signed)
-----   Message from [facility], [place] sent at 07/08/2015  6:04 PM EDT ----- Please let the patient know his lab looks good, no changes.  Thanks [employer]

## 2015-07-20 ENCOUNTER — Other Ambulatory Visit: Payer: Self-pay | Admitting: Internal Medicine

## 2015-07-21 MED FILL — SOTALOL HCL 80 MG TABLET: 80 | 30 days supply | Qty: 60 | Fill #0

## 2015-07-29 MED FILL — VYVANSE 70 MG CAPSULE: 70 | 90 days supply | Qty: 90 | Fill #0

## 2015-08-14 MED FILL — ENTRESTO 24 MG-26 MG TABLET: 24-26 | 30 days supply | Qty: 60 | Fill #3

## 2015-08-27 ENCOUNTER — Ambulatory Visit (HOSPITAL_BASED_OUTPATIENT_CLINIC_OR_DEPARTMENT_OTHER): Payer: 59 | Attending: Physician Assistant | Admitting: Cardiovascular Disease

## 2015-08-27 VITALS — Ht 77.0 in | Wt 215.0 lb

## 2015-08-27 DIAGNOSIS — R5383 Other fatigue: Secondary | ICD-10-CM | POA: Insufficient documentation

## 2015-08-27 DIAGNOSIS — Z7982 Long term (current) use of aspirin: Secondary | ICD-10-CM | POA: Diagnosis not present

## 2015-08-27 DIAGNOSIS — R0683 Snoring: Secondary | ICD-10-CM | POA: Diagnosis not present

## 2015-08-27 DIAGNOSIS — I255 Ischemic cardiomyopathy: Secondary | ICD-10-CM | POA: Insufficient documentation

## 2015-08-27 DIAGNOSIS — Z79899 Other long term (current) drug therapy: Secondary | ICD-10-CM | POA: Diagnosis not present

## 2015-08-27 MED FILL — SOTALOL HCL 80 MG TABLET: 80 | 30 days supply | Qty: 60 | Fill #1

## 2015-09-03 NOTE — Procedures (Signed)
Patient Name: [name], [name] Date: 08/27/2015 Gender: Male D.O.B: 1970/05/07 Age (years): 45 Referring Provider: Brien Few Charlcie Cradle PA-C Height (inches): 75 Interpreting Physician: Shelva Majestic MD, ABSM Weight (lbs): 215 RPSGT: Baxter Flattery BMI: 27 MRN: [id number] Neck Size: 18.00  CLINICAL INFORMATION Sleep Study Type: NPSG Indication for sleep study: Fatigue, Snoring Epworth Sleepiness Score: 5  SLEEP STUDY TECHNIQUE As per the AASM Manual for the Scoring of Sleep and Associated Events v2.3 (April 2016) with a hypopnea requiring 4% desaturations. The channels recorded and monitored were frontal, central and occipital EEG, electrooculogram (EOG), submentalis EMG (chin), nasal and oral airflow, thoracic and abdominal wall motion, anterior tibialis EMG, snore microphone, electrocardiogram, and pulse oximetry.  MEDICATIONS albuterol (PROVENTIL HFA;VENTOLIN HFA) 108 (90 BASE) MCG/ACT inhaler Taking 01/21/14 -- Elby Beck, FNP Inhale 2 puffs into the lungs 4 (four) times daily as needed for wheezing. aspirin 81 MG tablet Taking -- -- Historical Provider, MD carvedilol (COREG) 12.5 MG tablet Taking 12/12/14 -- Deboraha Sprang, MD TAKE 1 TABLET BY MOUTH 2 TIMES DAILY. ENTRESTO 24-26 MG Taking 05/05/15 -- Larey Dresser, MD TAKE 1 TABLET BY MOUTH 2 TIMES DAILY. fish oil-omega-3 fatty acids 1000 MG capsule Taking -- -- Historical Provider, MD Glucosamine-Chondroit-Vit C-Mn (GLUCOSAMINE 1500 COMPLEX PO) Taking -- -- Historical Provider, MD Lisdexamfetamine Dimesylate (VYVANSE PO) Taking -- -- Historical Provider, MD LORazepam (ATIVAN) 0.5 MG tablet Taking -- -- Historical Provider, MD sildenafil (VIAGRA) 50 MG tablet Taking 10/18/11 -- Deboraha Sprang, MD Take 1 tablet (50 mg total) by mouth daily as needed for erectile dysfunction. sotalol (BETAPACE) 80 MG tablet 07/21/15 -- Deboraha Sprang, MD TAKE 1 TABLET BY MOUTH EVERY 12 HOURS spironolactone (ALDACTONE) 25  MG tablet Taking 06/16/15 -- Larey Dresser, MD TAKE 1 TABLET BY MOUTH ONCE DAILY Turmeric 500 MG CAPS  Medications self-administered by patient during sleep study : No sleep medicine administered.  SLEEP ARCHITECTURE The study was initiated at 10:32:14 PM and ended at 5:04:32 AM. Sleep onset time was 4.3 minutes and the sleep efficiency was 45.6%. The total sleep time was 179.0 minutes. Wake after sleep onset (WASO) was 209 minutes Stage REM latency was N/A minutes. The patient spent 45.53% of the night in stage N1 sleep, 54.19% in stage N2 sleep, 0.28% in stage N3 and 0.00% in REM. Alpha intrusion was absent. Supine sleep was 15.36%.  RESPIRATORY PARAMETERS The overall apnea/hypopnea index (AHI) was 0.0 per hour. There were 0 total apneas, including 0 obstructive, 0 central and 0 mixed apneas. There were 0 hypopneas and 42 RERAs. The AHI during Stage REM sleep was N/A per hour. AHI while supine was 0.0 per hour. The mean oxygen saturation was 94.12%. The minimum SpO2 during sleep was 91.00%. Soft snoring was noted during this study.  CARDIAC DATA The 2 lead EKG demonstrated pacemaker generated. The mean heart rate was 48.35 beats per minute. Other EKG findings include: None.  LEG MOVEMENT DATA The total PLMS were 26 with a resulting PLMS index of 8.72. Associated arousal with leg movement index was 0.7.  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  - No significant central sleep apnea occurred during this study (CAI = 0.0/h). - The patient had minimal or no oxygen desaturation (Min O2 = 91.00%). - Reduced sleep efficiency at 45.6%. 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 - The patient snored with Soft snoring volume. -  No cardiac abnormalities were noted during this study. - Mild periodic limb movements of sleep occurred during the study. No significant associated  arousals.  DIAGNOSIS - Snoring  RECOMMENDATIONS - There is no indication for CPAP therapy. - Consider alternatives for the treatment of snoring. - Consider a sleep aid for improved sleep efficiency.  - Avoid alcohol, sedatives and other CNS depressants that may worsen sleep apnea and disrupt normal sleep architecture. - Sleep hygiene should be reviewed to assess factors that may improve sleep quality. - Weight management and regular exercise should be initiated or continued if appropriate.  [Electronically signed] 09/03/2015 07:32 PM  Shelva Majestic MD, [employer], [place], American Board of Sleep Medicine   NPI: [id number] [place] PH: [phone]   FX: [phone] [place]

## 2015-09-15 ENCOUNTER — Other Ambulatory Visit (HOSPITAL_COMMUNITY): Payer: Self-pay | Admitting: Cardiology

## 2015-09-15 ENCOUNTER — Other Ambulatory Visit: Payer: Self-pay | Admitting: Internal Medicine

## 2015-09-15 MED FILL — SPIRONOLACTONE 25 MG TABLET: 25 | 90 days supply | Qty: 90 | Fill #1

## 2015-09-15 MED FILL — ENTRESTO 24 MG-26 MG TABLET: 24-26 | 30 days supply | Qty: 60 | Fill #0

## 2015-09-16 MED FILL — CARVEDILOL 12.5 MG TABLET: 12.5 | 90 days supply | Qty: 180 | Fill #0

## 2015-09-25 MED FILL — SOTALOL HCL 80 MG TABLET: 80 | 30 days supply | Qty: 60 | Fill #2

## 2015-10-06 NOTE — Progress Notes (Signed)
Left message of results for pt  

## 2015-10-23 MED FILL — ENTRESTO 24 MG-26 MG TABLET: 24-26 | 30 days supply | Qty: 60 | Fill #1

## 2015-10-25 MED FILL — VYVANSE 70 MG CAPSULE: 70 | 90 days supply | Qty: 90 | Fill #0

## 2015-11-02 MED FILL — SOTALOL HCL 80 MG TABLET: 80 | 30 days supply | Qty: 60 | Fill #3

## 2015-11-23 MED FILL — ENTRESTO 24 MG-26 MG TABLET: 24-26 | 30 days supply | Qty: 60 | Fill #2

## 2015-11-25 ENCOUNTER — Encounter (HOSPITAL_COMMUNITY): Payer: Self-pay | Admitting: *Deleted

## 2015-11-25 ENCOUNTER — Emergency Department (HOSPITAL_COMMUNITY): Payer: 59

## 2015-11-25 ENCOUNTER — Observation Stay (HOSPITAL_COMMUNITY)
Admission: EM | Admit: 2015-11-25 | Discharge: 2015-11-25 | Disposition: A | Discharge status: Home / Self Care | Payer: 59 | Attending: Internal Medicine | Admitting: Internal Medicine

## 2015-11-25 DIAGNOSIS — Z8249 Family history of ischemic heart disease and other diseases of the circulatory system: Secondary | ICD-10-CM | POA: Diagnosis not present

## 2015-11-25 DIAGNOSIS — F909 Attention-deficit hyperactivity disorder, unspecified type: Secondary | ICD-10-CM | POA: Insufficient documentation

## 2015-11-25 DIAGNOSIS — Z95810 Presence of automatic (implantable) cardiac defibrillator: Secondary | ICD-10-CM | POA: Insufficient documentation

## 2015-11-25 DIAGNOSIS — I5022 Chronic systolic (congestive) heart failure: Secondary | ICD-10-CM | POA: Diagnosis not present

## 2015-11-25 DIAGNOSIS — Z8547 Personal history of malignant neoplasm of testis: Secondary | ICD-10-CM | POA: Diagnosis not present

## 2015-11-25 DIAGNOSIS — I4891 Unspecified atrial fibrillation: Secondary | ICD-10-CM | POA: Diagnosis not present

## 2015-11-25 DIAGNOSIS — Z4502 Encounter for adjustment and management of automatic implantable cardiac defibrillator: Secondary | ICD-10-CM

## 2015-11-25 DIAGNOSIS — Z7982 Long term (current) use of aspirin: Secondary | ICD-10-CM | POA: Diagnosis not present

## 2015-11-25 DIAGNOSIS — I959 Hypotension, unspecified: Secondary | ICD-10-CM | POA: Insufficient documentation

## 2015-11-25 DIAGNOSIS — I428 Other cardiomyopathies: Secondary | ICD-10-CM | POA: Insufficient documentation

## 2015-11-25 DIAGNOSIS — T82198A Other mechanical complication of other cardiac electronic device, initial encounter: Secondary | ICD-10-CM | POA: Diagnosis not present

## 2015-11-25 DIAGNOSIS — J45909 Unspecified asthma, uncomplicated: Secondary | ICD-10-CM | POA: Diagnosis not present

## 2015-11-25 DIAGNOSIS — I480 Paroxysmal atrial fibrillation: Secondary | ICD-10-CM | POA: Diagnosis not present

## 2015-11-25 DIAGNOSIS — Z79899 Other long term (current) drug therapy: Secondary | ICD-10-CM | POA: Insufficient documentation

## 2015-11-25 DIAGNOSIS — R000 Tachycardia, unspecified: Secondary | ICD-10-CM | POA: Diagnosis not present

## 2015-11-25 DIAGNOSIS — I472 Ventricular tachycardia: Secondary | ICD-10-CM | POA: Diagnosis present

## 2015-11-25 LAB — CBC
HCT: 42.3 % (ref 39.0–52.0)
Hemoglobin: 14.3 g/dL (ref 13.0–17.0)
MCH: 32.9 pg (ref 26.0–34.0)
MCHC: 33.8 g/dL (ref 30.0–36.0)
MCV: 97.5 fL (ref 78.0–100.0)
Platelets: 204 10*3/uL (ref 150–400)
RBC: 4.34 MIL/uL (ref 4.22–5.81)
RDW: 12.8 % (ref 11.5–15.5)
WBC: 7.6 10*3/uL (ref 4.0–10.5)

## 2015-11-25 LAB — BASIC METABOLIC PANEL
Anion gap: 11 (ref 5–15)
BUN: 18 mg/dL (ref 6–20)
CO2: 22 mmol/L (ref 22–32)
Calcium: 9.4 mg/dL (ref 8.9–10.3)
Chloride: 104 mmol/L (ref 101–111)
Creatinine, Ser: 1.07 mg/dL (ref 0.61–1.24)
GFR calc Af Amer: >60 mL/min (ref >60)
GFR calc non Af Amer: >60 mL/min (ref >60)
Glucose, Bld: 103 mg/dL — ABNORMAL HIGH (ref 65–99)
Potassium: 3.7 mmol/L (ref 3.5–5.1)
Sodium: 137 mmol/L (ref 135–145)

## 2015-11-25 LAB — MRSA PCR SCREENING: MRSA by PCR: NEGATIVE

## 2015-11-25 LAB — I-STAT TROPONIN, ED: Troponin i, poc: 0 ng/mL (ref 0.00–0.08)

## 2015-11-25 LAB — MAGNESIUM: Magnesium: 2.3 mg/dL (ref 1.7–2.4)

## 2015-11-25 LAB — BRAIN NATRIURETIC PEPTIDE: B Natriuretic Peptide: 37.1 pg/mL (ref 0.0–100.0)

## 2015-11-25 NOTE — ED Notes (Signed)
Pt has medtronic pacemaker.  Will interrogate pacer

## 2015-11-25 NOTE — ED Notes (Signed)
Patient has been informed of the need for him to be transferred to [facility] for further evaluation by cardiology by the EDP and this RN.  Patient was requested to sign the transfer acknowledgement for carelink transfer but refused. Patient verbalized his irritation and concern with the financial burden that this will cause.

## 2015-11-25 NOTE — H&P (Signed)
History and Physical   Patient ID: [name], MRN: [id number], DOB: Jan 17, 1970   Date of Encounter: 11/25/2015, 9:03 AM  Primary Care Provider: No PCP Per Patient Cardiologist:[facility] Electrophysiologist:  sk  Chief Complaint:  UNUSUAL sensation   History of Present Illness: [name] is a 45 y.o. male admitted for unusual sensations are related to his defibrillator. He felt twitching and pulsating.  It was clearly not a shock His ICD was originally implanted for secondary prevention.  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 Most recent ejection fraction was 25% or so.   He has had a history of symptomatic sustained ventricular tachycardia. Discussions were had concerning catheter ablation. It was elected to use sotalo . He has had recurrent prolonged nonsustained ventricular tachycardia; his device has been programmed to minimize therapy for this.   He ended up with ICD shock 8/15  ATP failed to terminate VT, 24J shock failed and 35 joule shock was successful  There was associated syncope   He underwent CRT upgrade 6/15 with a significant improvement in functional status    Past Medical History:  Diagnosis Date  . ADHD (attention deficit hyperactivity disorder)   . AICD (automatic cardioverter/defibrillator) present 06/30/2014   BiV ICD Insertion CRT-D  . Asthma   . ETOH abuse   . Heart murmur   . Nonischemic cardiomyopathy ([place])   . PAF (paroxysmal atrial fibrillation) ([place])   . Pneumonia ~ 01/2014  . Syncope    episode occured Feb 2011, ICD placed  . Systolic congestive heart failure ([place])    With ejection fraction of 25%, normal coronaries on Cath 02/2010  . 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([place]) 1998   "right"  . Ventricular tachycardia [facility])      Past Surgical History:  Procedure Laterality Date  . CARDIAC DEFIBRILLATOR PLACEMENT  2014   "single lead; [employer]"  . EP IMPLANTABLE DEVICE N/A 06/30/2014   Procedure: BiV ICD  Insertion CRT-D;  Surgeon: Deboraha Sprang, MD;  Location: [place] [facility];  Service: Cardiovascular;  Laterality: N/A;  . FRACTURE SURGERY Left ~ 1988  . ORCHIECTOMY Right 1998   with abdominal lymph node dissection  . WRIST FRACTURE SURGERY        Prior to Admission medications   Medication Sig Start Date End Date Taking? Authorizing Provider  albuterol (PROVENTIL HFA;VENTOLIN HFA) 108 (90 BASE) MCG/ACT inhaler Inhale 2 puffs into the lungs 4 (four) times daily as needed for wheezing. 01/21/14  Yes Elby Beck, FNP  aspirin 81 MG tablet Take 81 mg by mouth daily.     Yes Historical Provider, MD  carvedilol (COREG) 12.5 MG tablet TAKE 1 TABLET BY MOUTH 2 TIMES DAILY. 09/15/15  Yes Deboraha Sprang, MD  Coenzyme Q10 (CO Q 10 PO) Take 1 tablet by mouth daily.   Yes Historical Provider, MD  ENTRESTO 24-26 MG TAKE 1 TABLET BY MOUTH 2 TIMES DAILY. 09/15/15  Yes Larey Dresser, MD  fish oil-omega-3 fatty acids 1000 MG capsule Take 1 g by mouth daily.    Yes Historical Provider, MD  Glucosamine-Chondroit-Vit C-Mn (GLUCOSAMINE 1500 COMPLEX PO) Take 1 tablet by mouth daily.     Yes Historical Provider, MD  Lisdexamfetamine Dimesylate (VYVANSE PO) Take 70 mg by mouth daily.    Yes Historical Provider, MD  LORazepam (ATIVAN) 0.5 MG tablet Take 0.5 mg by mouth as directed.    Yes Historical Provider, MD  sotalol (BETAPACE) 80 MG tablet TAKE 1 TABLET BY  MOUTH EVERY 12 HOURS 07/21/15  Yes Deboraha Sprang, MD  spironolactone (ALDACTONE) 25 MG tablet TAKE 1 TABLET BY MOUTH ONCE DAILY 06/16/15  Yes Larey Dresser, MD  Turmeric 500 MG CAPS Take 1,500 mg by mouth daily.    Yes Historical Provider, MD  sildenafil (VIAGRA) 50 MG tablet Take 1 tablet (50 mg total) by mouth daily as needed for erectile dysfunction. 10/18/11   Deboraha Sprang, MD      Allergies: No Known Allergies   Social History:  The patient  reports that he is a non-smoker but has been exposed to tobacco smoke. He has never used  smokeless tobacco. He reports that he drinks about 16.8 oz of alcohol per week . He reports that he does not use drugs.   Family History:  The patient's family history includes Heart disease in his mother; Pancreatic cancer in his maternal grandfather.   ROS:  Please see the history of present illness.      All other systems reviewed and negative.   Vital Signs: Blood pressure 92/62, pulse 62, temperature 98 F (36.7 C), temperature source Oral, resp. rate 13, height 6\' 5"  (1.956 m), weight 210 lb (95.3 kg), SpO2 94 %.  PHYSICAL EXAM: General:  Well nourished, well developed, in no acute distress*  HEENT: normal Lymph: no adenopathy Neck: no JVD Endocrine:  No thryomegaly Vascular: No carotid bruits; FA pulses 2+ bilaterally without bruits  Cardiac:  normal S1, S2; RRR; no murmur  Lungs:  clear to auscultation bilaterally, no wheezing, rhonchi or rales  Abd: soft, nontender, no hepatomegaly  Ext: no edema Musculoskeletal:  No deformities, BUE and BLE strength normal and equal Skin: warm and dry  Neuro:  CNs 2-12 intact, no focal abnormalities noted Psych:  Normal affect    EKG:   P-synchronous/ AV  pacing   Labs:   Lab Results  Component Value Date   WBC 7.6 11/25/2015   HGB 14.3 11/25/2015   HCT 42.3 11/25/2015   MCV 97.5 11/25/2015   PLT 204 11/25/2015     Recent Labs Lab 11/25/15 0102  NA 137  K 3.7  CL 104  CO2 22  BUN 18  CREATININE 1.07  CALCIUM 9.4  GLUCOSE 103*    No results for input(s): CKTOTAL, CKMB, TROPONINI in the last 72 hours.  Lab Results  Component Value Date   CHOL  02/06/2010    167        ATP III CLASSIFICATION:  <200     mg/dL   Desirable  200-239  mg/dL   Borderline High  >=240    mg/dL   High          HDL 59 02/06/2010   LDLCALC  02/06/2010    89        Total Cholesterol/HDL:CHD Risk Coronary Heart Disease Risk Table                     Men   Women  1/2 Average Risk   3.4   3.3  Average Risk       5.0   4.4  2 X Average  Risk   9.6   7.1  3 X Average Risk  23.4   11.0        Use the calculated Patient Ratio above and the CHD Risk Table to determine the patient's CHD Risk.        ATP III CLASSIFICATION (LDL):  <100     mg/dL  Optimal  100-129  mg/dL   Near or Above                    Optimal  130-159  mg/dL   Borderline  160-189  mg/dL   High  >190     mg/dL   Very High   TRIG 96 02/06/2010    Lab Results  Component Value Date   DDIMER  02/05/2010    0.22        AT THE INHOUSE ESTABLISHED CUTOFF VALUE OF 0.48 ug/mL FEU, THIS ASSAY HAS BEEN DOCUMENTED IN THE LITERATURE TO HAVE A SENSITIVITY AND NEGATIVE PREDICTIVE VALUE OF AT LEAST 98 TO 99%.  THE TEST RESULT SHOULD BE CORRELATED WITH AN ASSESSMENT OF THE CLINICAL PROBABILITY OF DVT / VTE.     Radiology/Studies:   Dg Chest 2 View  Result Date: 11/25/2015 CLINICAL DATA:  Pacemaker firing.  Heart rate 160.  Nonsmoker. EXAM: CHEST  2 VIEW COMPARISON:  07/01/2014 FINDINGS: Cardiac pacemaker. Normal heart size and pulmonary vascularity. No focal airspace disease or consolidation in the lungs. No blunting of costophrenic angles. No pneumothorax. Mediastinal contours appear intact. Surgical clips in the upper abdomen. IMPRESSION: No active cardiopulmonary disease. Electronically Signed   By: Lucienne Capers M.D.   On: 11/25/2015 01:42      ASSESSMENT AND PLAN:   1. NICM 2. CHF chronic systolic 3. PAF 4. Diaphragmatic stimulation 5. Hypotension 6. Hypokalemia-relative 7. High risk medication surveillance  The patient presented with unusual sensations in his chest. This turns out to be coincidental with the onset of atrial fibrillation variable conduction and intermittent ventricular pacing. It was also important  Past with high output pacing suggestive of diaphragmatic stimulation. I suspect, Occams razor being what it is, that was the irregularity that cause a sensation that was troubling as opposed to the diaphragm stimulation.  At  this juncture, we will make no medication changes. We have had discussions previously regarding anticoagulation which he has declined.  His potassium is borderline low; we will have him augment dietary intake.  He is advised that in the future, that he can call us in the morning for a transmission  We will discharge him  Signed,  Virl Axe 11/25/2015 9:03 AM  Pager # [phone]

## 2015-11-25 NOTE — Progress Notes (Signed)
I received a call from [facility] regarding the pt. Mcneice. The ER physician provided a story of numerous episodes resembling shock within the preceding 12h. The phsysiciain described that there were >30 episodes of which about a handful were ICD discharges whereas the other ones were twitches. Per report the patient reported that the episodes felt like his previous episodes of ICD discharge. The pt was HDS. Physician asked about potential transfer  for further evaluation. My concern at this time point was VT storm given the presented story. An interrogation was pending but report was not in yet. I accepted the patient to the CCU and asked to get back to me with the interrogation report as soon as it comes in. As the patient was picked up at [facility] I was called back with the update that the interrogation did never go through (after 3 attempts). I reassured that I can do the interrogation upon arrival.   Pt arrived just before 6. He appeared in good condition, HDS. Patient told me he did not experience any shocks and was able to differentiate the episodes he had experienced this night from prior ICD discharges. Subsequently, his ICD interrogation showed no VT or shocks. He did have an episode of Afib (HR as fast as 205) just after midnight which correlated with the described sensation of "jumpiness" and "jolts". It lasted over 1 h. He is now in NSR.   Patient is seemingly upset because he tried to convince [facility] team that he had no shocks and wanted to avoid any additional costs that could accrual as he is very cost conscious. I tried to explain to him that the symptoms he described were perceived as a serious concern especially given his history of NICM and ICD discharges in the past.  Cristina Gong, MD

## 2015-11-25 NOTE — ED Notes (Signed)
Dr. Winfred Leeds and Kathlee Nations, RN made aware of pt.

## 2015-11-25 NOTE — ED Provider Notes (Signed)
[facility] DEPT Provider Note   CSN: [id number] Arrival date & time: 11/25/15  0023  By signing my name below, I, Irene Pap, attest that this documentation has been prepared under the direction and in the presence of Everlene Balls, MD. Electronically Signed: Irene Pap, ED Scribe. 11/25/15. 1:28 AM.  History   Chief Complaint Chief Complaint  Patient presents with  . Pacemaker Problem   The history is provided by the patient. No language interpreter was used.  HPI Comments: ANTE CANSLER is a 45 y.o. male with a hx of AICD, ETOH abuse, PAF, CHF, testicular cancer, nonischemic cardiomyopathy, and ventricular tachycardia who presents to the Emergency Department complaining of a pacemaker problem onset two hours ago. Pt reports that he was getting ready for bed when he felt "an indigestion feeling; it's as if a hiccup and an electrical shock had a baby" on his left side. He noticed that he felt this sensation repeatedly about every other 30 seconds. He realized that it was his pacemaker firing off. He said he stopped counting how many times it happened after 47 times. He reports that the shocks varied in intensity. He notes that this stopped about 15 minutes ago. He denies worsening or alleviating factors.  He denies recent hx of cough, congestion, or cold symptoms. Pt has a cardiologist, Dr. Caryl Comes.   Past Medical History:  Diagnosis Date  . ADHD (attention deficit hyperactivity disorder)   . AICD (automatic cardioverter/defibrillator) present 06/30/2014   BiV ICD Insertion CRT-D  . Asthma   . ETOH abuse   . Heart murmur   . Nonischemic cardiomyopathy ([place])   . PAF (paroxysmal atrial fibrillation) ([place])   . Pneumonia ~ 01/2014  . Syncope    episode occured Feb 2011, ICD placed  . Systolic congestive heart failure ([place])    With ejection fraction of 25%, normal coronaries on Cath 02/2010  . 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([place]) 1998   "right"  . Ventricular tachycardia [facility])      Patient Active Problem List   Diagnosis Date Noted  . Ventricular tachycardia ([place]) 06/30/2014  . Atrial fibrillation, unspecified   . Chronic systolic CHF (congestive heart failure) ([place]) 10/28/2013  . Congestive heart failure, unspecified 02/23/2011  . Paroxysmal ventricular tachycardia ([place]) 02/23/2011  . S/P implantation of automatic cardioverter/defibrillator (AICD) 02/23/2011  . HYPERTENSION, HEART CONTROLLED W/O ASSOC CHF 02/08/2010  . CARDIOMYOPATHY, SECONDARY 02/08/2010  . SYNCOPE 02/08/2010  . History of testicular cancer 10/27/2009  . ASTHMA 10/27/2009    Past Surgical History:  Procedure Laterality Date  . CARDIAC DEFIBRILLATOR PLACEMENT  2014   "single lead; [employer]"  . EP IMPLANTABLE DEVICE N/A 06/30/2014   Procedure: BiV ICD Insertion CRT-D;  Surgeon: Deboraha Sprang, MD;  Location: [place] [facility];  Service: Cardiovascular;  Laterality: N/A;  . FRACTURE SURGERY Left ~ 1988  . ORCHIECTOMY Right 1998   with abdominal lymph node dissection  . WRIST FRACTURE SURGERY       Home Medications    Prior to Admission medications   Medication Sig Start Date End Date Taking? Authorizing Provider  albuterol (PROVENTIL HFA;VENTOLIN HFA) 108 (90 BASE) MCG/ACT inhaler Inhale 2 puffs into the lungs 4 (four) times daily as needed for wheezing. 01/21/14  Yes Elby Beck, FNP  aspirin 81 MG tablet Take 81 mg by mouth daily.     Yes Historical Provider, MD  carvedilol (COREG) 12.5 MG tablet TAKE 1 TABLET BY MOUTH 2 TIMES DAILY. 09/15/15  Yes Deboraha Sprang, MD  Coenzyme Q10 (CO Q 10 PO) Take 1 tablet by mouth daily.   Yes Historical Provider, MD  ENTRESTO 24-26 MG TAKE 1 TABLET BY MOUTH 2 TIMES DAILY. 09/15/15  Yes Larey Dresser, MD  fish oil-omega-3 fatty acids 1000 MG capsule Take 1 g by mouth daily.    Yes Historical Provider, MD  Glucosamine-Chondroit-Vit C-Mn (GLUCOSAMINE 1500 COMPLEX PO) Take 1 tablet by mouth daily.     Yes Historical Provider, MD   Lisdexamfetamine Dimesylate (VYVANSE PO) Take 70 mg by mouth daily.    Yes Historical Provider, MD  LORazepam (ATIVAN) 0.5 MG tablet Take 0.5 mg by mouth as directed.    Yes Historical Provider, MD  sotalol (BETAPACE) 80 MG tablet TAKE 1 TABLET BY MOUTH EVERY 12 HOURS 07/21/15  Yes Deboraha Sprang, MD  spironolactone (ALDACTONE) 25 MG tablet TAKE 1 TABLET BY MOUTH ONCE DAILY 06/16/15  Yes Larey Dresser, MD  Turmeric 500 MG CAPS Take 1,500 mg by mouth daily.    Yes Historical Provider, MD  sildenafil (VIAGRA) 50 MG tablet Take 1 tablet (50 mg total) by mouth daily as needed for erectile dysfunction. 10/18/11   Deboraha Sprang, MD    Family History Family History  Problem Relation Age of Onset  . Pancreatic cancer Maternal Grandfather   . Heart disease Mother     mother and sister with valve problems  . Cardiomyopathy Neg Hx     Social History Social History  Substance Use Topics  . Smoking status: Passive Smoke Exposure - Never Smoker  . 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 Alcohol use 16.8 oz/week    7 Glasses of wine, 21 Cans of beer per week     Comment: 06/30/2014 "2-6 beer2 or glasses of wine/day; probably 2/3 of these beers"     Allergies   Patient has no known allergies.   Review of Systems Review of Systems 10 Systems reviewed and all are negative for acute change except as noted in the HPI.  Physical Exam Updated Vital Signs BP 93/60 (BP Location: Right Arm)   Pulse 77   Temp 97.9 F (36.6 C) (Oral)   Resp 16   SpO2 96%   Physical Exam  Constitutional: He is oriented to person, place, and time. Vital signs are normal. He appears well-developed and well-nourished.  Non-toxic appearance. He does not appear ill. No distress.  HENT:  Head: Normocephalic and atraumatic.  Nose: Nose normal.  Mouth/Throat: Oropharynx is clear and moist. No oropharyngeal exudate.  Eyes: Conjunctivae and EOM  are normal. Pupils are equal, round, and reactive to light. No scleral icterus.  Neck: Normal range of motion. Neck supple. No tracheal deviation, no edema, no erythema and normal range of motion present. No thyroid mass and no thyromegaly present.  Cardiovascular: Normal rate, regular rhythm, S1 normal, S2 normal, normal heart sounds, intact distal pulses and normal pulses.  Exam reveals no gallop and no friction rub.   No murmur heard. Pulmonary/Chest: Effort normal and breath sounds normal. No respiratory distress. He has no wheezes. He has no rhonchi. He has no rales.  Pacemaker left chest  Abdominal: Soft. Normal appearance and bowel sounds are normal. He exhibits no distension, no ascites and no mass. There is no hepatosplenomegaly. There is no tenderness. There is no rebound, no guarding and no CVA tenderness.  Musculoskeletal: Normal range of motion. He exhibits  no edema or tenderness.  Lymphadenopathy:    He has no cervical adenopathy.  Neurological: He is alert and oriented to person, place, and time. He has normal strength. No cranial nerve deficit or sensory deficit.  Skin: Skin is warm, dry and intact. No petechiae and no rash noted. He is not diaphoretic. No erythema. No pallor.  Nursing note and vitals reviewed.  ED Treatments / Results  DIAGNOSTIC STUDIES: Oxygen Saturation is 100% on RA, normal by my interpretation.    COORDINATION OF CARE: 1:27 AM-Discussed treatment plan which includes labs, EKG, x-ray, and interrogation of pacemaker with pt at bedside and pt agreed to plan.   Labs (all labs ordered are listed, but only abnormal results are displayed) Labs Reviewed  BASIC METABOLIC PANEL - Abnormal; Notable for the following:       Result Value   Glucose, Bld 103 (*)    All other components within normal limits  CBC  MAGNESIUM  BRAIN NATRIURETIC PEPTIDE  I-STAT TROPOININ, ED    EKG  EKG Interpretation  Date/Time:  Wednesday November 25 2015 01:08:59  EST Ventricular Rate:  78 PR Interval:    QRS Duration: 139 QT Interval:  435 QTC Calculation: 496 R Axis:   -114 Text Interpretation:  Sinus rhythm Nonspecific IVCD with LAD Anterolateral infarct, age indeterminate no longer paced rhthym Confirmed by Glynn Octave [phone]) on 11/25/2015 2:45:38 AM       Radiology Dg Chest 2 View  Result Date: 11/25/2015 CLINICAL DATA:  Pacemaker firing.  Heart rate 160.  Nonsmoker. EXAM: CHEST  2 VIEW COMPARISON:  07/01/2014 FINDINGS: Cardiac pacemaker. Normal heart size and pulmonary vascularity. No focal airspace disease or consolidation in the lungs. No blunting of costophrenic angles. No pneumothorax. Mediastinal contours appear intact. Surgical clips in the upper abdomen. IMPRESSION: No active cardiopulmonary disease. Electronically Signed   By: Lucienne Capers M.D.   On: 11/25/2015 01:42    Procedures Procedures (including critical care time)  Medications Ordered in ED Medications - No data to display   Initial Impression / Assessment and Plan / ED Course  I have reviewed the triage vital signs and the nursing notes.  Pertinent labs & imaging results that were available during my care of the patient were reviewed by me and considered in my medical decision making (see chart for details).  Clinical Course    Patient presents to the ED for for pacemaker firing.  This has occurred over 40x he states.  Plan on labs, check electrolytes, CXR, and consult with cardiology for possible admission.   3:00 AM Labs and CXR unremarkable.  I spoke with Dr. Florian Buff with cardiology who recommends for transfer for further evaluation of possible Vtach storm.  We have tried to interrogate the ICD but no report has come back.  RN informed me that she is unsure if the interrogation even went through.  Will try again and inform cardiology of results.   Nursing staff was unable to interrogate pacer.  Cardiology informed, this will have to be done at [facility].    Final Clinical Impressions(s) / ED Diagnoses   Final diagnoses:  None     I personally performed the services described in this documentation, which was scribed in my presence. The recorded information has been reviewed and is accurate.     New Prescriptions New Prescriptions   No medications on file     Everlene Balls, MD 11/25/15 [id number]

## 2015-11-25 NOTE — Progress Notes (Signed)
Cardiology paged on pts arrival to [id number].

## 2015-11-25 NOTE — Discharge Summary (Signed)
Please see H&P from today by Dr. Caryl Comes for details.   Tommye Standard, PA-C

## 2015-11-25 NOTE — ED Triage Notes (Signed)
Pt states that his pacemaker has shocked him repeatedly tonight. HR 160+. Alert an doriented.

## 2015-12-11 MED FILL — SOTALOL 80 MG TABLET: 80 | 30 days supply | Qty: 60 | Fill #4

## 2015-12-21 MED FILL — SPIRONOLACTONE 25 MG TABLET: 25 | 90 days supply | Qty: 90 | Fill #2

## 2015-12-23 ENCOUNTER — Encounter: Payer: 59 | Admitting: Internal Medicine

## 2015-12-24 MED FILL — ENTRESTO 24 MG-26 MG TABLET: 24-26 | 30 days supply | Qty: 60 | Fill #3

## 2016-01-11 ENCOUNTER — Encounter: Payer: 59 | Admitting: Internal Medicine

## 2016-01-20 ENCOUNTER — Other Ambulatory Visit (HOSPITAL_COMMUNITY): Payer: Self-pay | Admitting: Cardiology

## 2016-01-20 MED FILL — CARVEDILOL 12.5 MG TABLET: 12.5 | 90 days supply | Qty: 180 | Fill #1

## 2016-01-20 MED FILL — SOTALOL 80 MG TABLET: 80 | 30 days supply | Qty: 60 | Fill #5

## 2016-01-22 MED FILL — ENTRESTO 24 MG-26 MG TABLET: 24-26 | 30 days supply | Qty: 60 | Fill #0

## 2016-01-25 DIAGNOSIS — F900 Attention-deficit hyperactivity disorder, predominantly inattentive type: Secondary | ICD-10-CM | POA: Diagnosis not present

## 2016-01-25 MED FILL — VYVANSE 70 MG CAPSULE: 70 | 30 days supply | Qty: 30 | Fill #0

## 2016-02-22 ENCOUNTER — Encounter: Payer: Self-pay | Admitting: Podiatry

## 2016-02-22 ENCOUNTER — Ambulatory Visit (INDEPENDENT_AMBULATORY_CARE_PROVIDER_SITE_OTHER): Payer: 59

## 2016-02-22 ENCOUNTER — Ambulatory Visit (INDEPENDENT_AMBULATORY_CARE_PROVIDER_SITE_OTHER): Payer: 59 | Admitting: Podiatry

## 2016-02-22 VITALS — BP 118/77 | HR 63 | Resp 16 | Ht 77.0 in | Wt 215.0 lb

## 2016-02-22 DIAGNOSIS — M779 Enthesopathy, unspecified: Secondary | ICD-10-CM

## 2016-02-22 MED ORDER — TRIAMCINOLONE ACETONIDE 10 MG/ML IJ SUSP
10.0000 mg | Freq: Once | INTRAMUSCULAR | Status: AC
  Administered 2016-02-22: 10 mg

## 2016-02-22 NOTE — Progress Notes (Signed)
   Subjective:    Patient ID: [name], male    DOB: Sep 26, 1970, 46 y.o.   MRN: [id number]  HPI Chief Complaint  Patient presents with  . Foot Pain    Bilateral; plantar forefoot; interdigital great toe & 2nd toe; pt stated, "Left foot hurts more"; x2-3 months      Review of Systems  All other systems reviewed and are negative.      Objective:   Physical Exam        Assessment & Plan:

## 2016-02-22 NOTE — Progress Notes (Signed)
Subjective:     Patient ID: [name], male   DOB: 08/18/1970, 46 y.o.   MRN: [id number]  HPI patient presents with a lot of pain left over right foot around the metatarsal joint and does not remember specific injury but he is on his foot quite a bit and it's been present for around 3 months   Review of Systems  All other systems reviewed and are negative.      Objective:   Physical Exam  Constitutional: He is oriented to person, place, and time.  Cardiovascular: Intact distal pulses.   Musculoskeletal: Normal range of motion.  Neurological: He is oriented to person, place, and time.  Skin: Skin is warm.  Nursing note and vitals reviewed.  neurovascular status intact muscle strength adequate range of motion within normal limits with patient noted to have quite a bit of inflammation the left second metatarsal phalangeal joint with fluid buildup around the joint and pain when palpated. Patient's right foot has mild discomfort but not to the same degree and I did note swelling in the left second MPJ. Patient found to have good digital perfusion and well oriented 3     Assessment:     Inflammatory capsulitis second MPJ left with possibility for arthritis or other pathology and mild discomfort on the right     Plan:     H&P x-rays reviewed and at this time did proximal nerve block left aspirated the second MPJ getting out of small amount of clear fluid and injected with a quarter cc deck some some Kenalog and applied thick plantar padding to reduce pressure against the joint surface. Reappoint to recheck  X-ray report indicated that there is no signs of stress fracture or advanced arthritis

## 2016-02-24 MED FILL — ENTRESTO 24 MG-26 MG TABLET: 24-26 | 30 days supply | Qty: 60 | Fill #1

## 2016-02-24 MED FILL — SOTALOL 80 MG TABLET: 80 | 30 days supply | Qty: 60 | Fill #6

## 2016-02-25 MED FILL — VYVANSE 70 MG CAPSULE: 70 | 30 days supply | Qty: 30 | Fill #0

## 2016-03-07 ENCOUNTER — Encounter: Payer: Self-pay | Admitting: Podiatry

## 2016-03-07 ENCOUNTER — Ambulatory Visit (INDEPENDENT_AMBULATORY_CARE_PROVIDER_SITE_OTHER): Payer: 59 | Admitting: Podiatry

## 2016-03-07 DIAGNOSIS — M7750 Other enthesopathy of unspecified foot: Secondary | ICD-10-CM | POA: Diagnosis not present

## 2016-03-07 DIAGNOSIS — M779 Enthesopathy, unspecified: Secondary | ICD-10-CM

## 2016-03-09 NOTE — Progress Notes (Signed)
Subjective:     Patient ID: [name], male   DOB: 11-14-1970, 46 y.o.   MRN: [id number]  HPI patient presents stating I'm improved and pain in my joint   Review of Systems     Objective:   Physical Exam Neurovascular status intact negative Homans sign was noted with patient still having discomfort in the second metatarsophalangeal joint with moderate fluid buildup noted    Assessment:     Inflammatory capsulitis present    Plan:     H&P condition reviewed and at this point I recommended orthotics to disperse pressure off the area. Patient wants orthotics and is scanned for customized orthotic devices at the time

## 2016-03-18 ENCOUNTER — Encounter: Payer: Self-pay | Admitting: Cardiology

## 2016-03-18 MED FILL — SPIRONOLACTONE 25 MG TABLET: 25 | 90 days supply | Qty: 90 | Fill #3

## 2016-03-22 MED FILL — ENTRESTO 24 MG-26 MG TABLET: 24-26 | 30 days supply | Qty: 60 | Fill #2

## 2016-03-22 MED FILL — SOTALOL 80 MG TABLET: 80 | 30 days supply | Qty: 60 | Fill #7

## 2016-03-24 MED FILL — VYVANSE 70 MG CAPSULE: 70 | 30 days supply | Qty: 30 | Fill #0

## 2016-03-28 ENCOUNTER — Other Ambulatory Visit: Payer: 59

## 2016-03-29 ENCOUNTER — Other Ambulatory Visit: Payer: 59

## 2016-04-01 NOTE — Progress Notes (Signed)
Electrophysiology Office Note Date: 04/04/2016  ID:  [name], DOB October 11, 1970, MRN [id number]  PCP: No PCP Per Patient Electrophysiologist: Caryl Comes  CC: Routine ICD follow-up  [name] is a 46 y.o. male seen today for Dr Caryl Comes.  He was last seen by [facility] in July of 2017 and at that time was scheduled for 6 month follow up with Dr Caryl Comes but is on my schedule today.  He presents today for routine electrophysiology followup.  Since last being seen in our clinic, the patient reports doing very well. He denies chest pain, palpitations, dyspnea, PND, orthopnea, nausea, vomiting, dizziness, syncope, edema, weight gain, or early satiety.  He has not had ICD shocks.   Device History: MDT single chamber ICD implanted 2012 for NICM/VT; upgrade to CRTD 2016 History of appropriate therapy: yes History of AAD therapy: yes - Sotalol for AF/VT   Past Medical History:  Diagnosis Date  . ADHD (attention deficit hyperactivity disorder)   . AICD (automatic cardioverter/defibrillator) present 06/30/2014   BiV ICD Insertion CRT-D  . Asthma   . ETOH abuse   . Heart murmur   . Nonischemic cardiomyopathy ([place])   . PAF (paroxysmal atrial fibrillation) ([place])   . Pneumonia ~ 01/2014  . Syncope    episode occured Feb 2011, ICD placed  . Systolic congestive heart failure ([place])    With ejection fraction of 25%, normal coronaries on Cath 02/2010  . 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([place]) 1998   "right"  . Ventricular tachycardia [facility])    Past Surgical History:  Procedure Laterality Date  . CARDIAC DEFIBRILLATOR PLACEMENT  2014   "single lead; Medtronic"  . EP IMPLANTABLE DEVICE N/A 06/30/2014   Procedure: BiV ICD Insertion CRT-D;  Surgeon: Deboraha Sprang, MD;  Location: [place] [facility];  Service: Cardiovascular;  Laterality: N/A;  . FRACTURE SURGERY Left ~ 1988  . ORCHIECTOMY Right 1998   with abdominal lymph node dissection  . WRIST FRACTURE SURGERY      Current Outpatient Prescriptions    Medication Sig Dispense Refill  . albuterol (PROVENTIL HFA;VENTOLIN HFA) 108 (90 BASE) MCG/ACT inhaler Inhale 2 puffs into the lungs 4 (four) times daily as needed for wheezing. 1 Inhaler 5  . aspirin 81 MG tablet Take 81 mg by mouth daily.      . carvedilol (COREG) 12.5 MG tablet TAKE 1 TABLET BY MOUTH 2 TIMES DAILY. 180 tablet 2  . Coenzyme Q10 (CO Q 10 PO) Take 1 tablet by mouth daily.    Marland Kitchen ENTRESTO 24-26 MG TAKE 1 TABLET BY MOUTH 2 TIMES DAILY. 60 tablet 3  . fish oil-omega-3 fatty acids 1000 MG capsule Take 1 g by mouth daily.     . Glucosamine-Chondroit-Vit C-Mn (GLUCOSAMINE 1500 COMPLEX PO) Take 1 tablet by mouth daily.      . Lisdexamfetamine Dimesylate (VYVANSE PO) Take 70 mg by mouth daily.     Marland Kitchen LORazepam (ATIVAN) 0.5 MG tablet Take 0.5 mg by mouth as directed.     . sildenafil (VIAGRA) 50 MG tablet Take 1 tablet (50 mg total) by mouth daily as needed for erectile dysfunction. 10 tablet 2  . sotalol (BETAPACE) 80 MG tablet TAKE 1 TABLET BY MOUTH EVERY 12 HOURS 60 tablet 11  . spironolactone (ALDACTONE) 25 MG tablet TAKE 1 TABLET BY MOUTH ONCE DAILY 90 tablet 3  . Turmeric 500 MG CAPS Take 1,500 mg by mouth daily.      No current facility-administered medications for this visit.  Allergies:   Patient has no known allergies.   Social History: Social History   Social History  . Marital status: Married    Spouse name: N/A  . Number of children: N/A  . Years of education: N/A   Occupational History  . Part time Programme researcher, broadcasting/film/video    Social History Main Topics  . Smoking status: Passive Smoke Exposure - Never Smoker  . 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 Alcohol use 16.8 oz/week    7 Glasses of wine, 21 Cans of beer per week     Comment: 06/30/2014 "2-6 beer2 or glasses of wine/day; probably 2/3 of these beers"  . Drug use: No  . Sexual activity: Not Currently   Other Topics Concern  . Not  on file   Social History Narrative  . No narrative on file    Family History: Family History  Problem Relation Age of Onset  . Heart disease Mother     mother and sister with valve problems  . Pancreatic cancer Maternal Grandfather   . Cardiomyopathy Neg Hx     Review of Systems: All other systems reviewed and are otherwise negative except as noted above.   Physical Exam: VS:  BP 126/78   Pulse 70   Ht 6\' 5"  (1.956 m)   Wt 220 lb 9.6 oz (100.1 kg)   SpO2 97%   BMI 26.16 kg/m  , BMI Body mass index is 26.16 kg/m.  GEN- The patient is well appearing, alert and oriented x 3 today.   HEENT: normocephalic, atraumatic; sclera clear, conjunctiva pink; hearing intact; oropharynx clear; neck supple  Lungs- Clear to ausculation bilaterally, normal work of breathing.  No wheezes, rales, rhonchi Heart- Regular rate and rhythm (paced) GI- soft, non-tender, non-distended, bowel sounds present  Extremities- no clubbing, cyanosis, or edema  MS- no significant deformity or atrophy Skin- warm and dry, no rash or lesion; ICD pocket well healed Psych- euthymic mood, full affect Neuro- strength and sensation are intact  ICD interrogation- reviewed in detail today,  See PACEART report  EKG:  EKG is ordered today. The ekg ordered today shows sinus rhythm with V pacing   Recent Labs: 11/25/2015: B Natriuretic Peptide 37.1; BUN 18; Creatinine, Ser 1.07; Hemoglobin 14.3; Magnesium 2.3; Platelets 204; Potassium 3.7; Sodium 137   Wt Readings from Last 3 Encounters:  04/04/16 220 lb 9.6 oz (100.1 kg)  02/22/16 215 lb (97.5 kg)  11/25/15 210 lb (95.3 kg)     Assessment and Plan:  1.  Chronic systolic dysfunction euvolemic today Stable on an appropriate medical regimen Normal ICD function See Pace Art report No changes today We discussed updating echo post CRT implant - he pays a significant amount of pocket for echo's and would like to defer at this time. He is symptomatically  improved post CRT   2.  Ventricular tachycardia No recent recurrence Continue Sotalol QTc stable today; BMET, Mg today   3.  Lone atrial fibrillation No recurrence since 2016 Continue to monitor remotely    Current medicines are reviewed at length with the patient today.   The patient does not have concerns regarding his medicines.  The following changes were made today:  none  Labs/ tests ordered today include: BMET, Mg  Orders Placed This Encounter  Procedures  . EKG 12-Lead     Disposition:   Follow up with [facility], Dr Caryl Comes  6 months    Signed, Chanetta Marshall, NP 04/04/2016 11:09 AM  [facility] [address] [place] [address] [phone] (office) [phone] (fax

## 2016-04-04 ENCOUNTER — Ambulatory Visit (INDEPENDENT_AMBULATORY_CARE_PROVIDER_SITE_OTHER): Payer: 59 | Admitting: Nurse Practitioner

## 2016-04-04 VITALS — BP 126/78 | HR 70 | Ht 77.0 in | Wt 220.6 lb

## 2016-04-04 DIAGNOSIS — I472 Ventricular tachycardia, unspecified: Secondary | ICD-10-CM

## 2016-04-04 DIAGNOSIS — I4891 Unspecified atrial fibrillation: Secondary | ICD-10-CM

## 2016-04-04 DIAGNOSIS — I5022 Chronic systolic (congestive) heart failure: Secondary | ICD-10-CM | POA: Diagnosis not present

## 2016-04-04 LAB — CUP PACEART INCLINIC DEVICE CHECK
Date Time Interrogation Session: 20180402111946
Implantable Lead Implant Date: 20120208
Implantable Lead Implant Date: 20160627
Implantable Lead Implant Date: 20160627
Implantable Lead Location: 753858
Implantable Lead Location: 753860
Implantable Lead Location: 753860
Implantable Lead Model: 185
Implantable Lead Model: 4598
Implantable Lead Model: 5076
Implantable Lead Serial Number: 345409
Implantable Pulse Generator Implant Date: 20160627

## 2016-04-04 LAB — BASIC METABOLIC PANEL
BUN/Creatinine Ratio: 16 (ref 9–20)
BUN: 18 mg/dL (ref 6–24)
CO2: 23 mmol/L (ref 18–29)
Calcium: 9.1 mg/dL (ref 8.7–10.2)
Chloride: 99 mmol/L (ref 96–106)
Creatinine, Ser: 1.16 mg/dL (ref 0.76–1.27)
GFR calc Af Amer: 86 mL/min/{1.73_m2} (ref >59)
GFR calc non Af Amer: 75 mL/min/{1.73_m2} (ref >59)
Glucose: 91 mg/dL (ref 65–99)
Potassium: 4.3 mmol/L (ref 3.5–5.2)
Sodium: 140 mmol/L (ref 134–144)

## 2016-04-04 LAB — MAGNESIUM: Magnesium: 2 mg/dL (ref 1.6–2.3)

## 2016-04-04 NOTE — Patient Instructions (Signed)
Medication Instructions:  None Ordered   Labwork:  Your physician recommends that you return for lab work today: BMET/Magnesium   Testing/Procedures: None Ordered   Follow-Up: Remote monitoring is used to monitor your Pacemaker from home. This monitoring reduces the number of office visits required to check your device to one time per year. It allows [place] to keep an eye on the functioning of your device to ensure it is working properly. You are scheduled for a device check from home on 07/04/2016 You may send your transmission at any time that day. If you have a wireless device, the transmission will be sent automatically. After your physician reviews your transmission, you will receive a postcard with your next transmission date.  Your physician wants you to follow-up in: 6 months with Dr. Caryl Comes. You will receive a reminder letter in the mail two months in advance. If you don't receive a letter, please call our office to schedule the follow-up appointment.   Any Other Special Instructions Will Be Listed Below (If Applicable).     If you need a refill on your cardiac medications before your next appointment, please call your pharmacy.

## 2016-04-11 ENCOUNTER — Other Ambulatory Visit (INDEPENDENT_AMBULATORY_CARE_PROVIDER_SITE_OTHER): Payer: 59

## 2016-04-22 MED FILL — VYVANSE 70 MG CAPSULE: 70 | 30 days supply | Qty: 30 | Fill #0

## 2016-04-22 MED FILL — ENTRESTO 24 MG-26 MG TABLET: 24-26 | 30 days supply | Qty: 60 | Fill #3

## 2016-04-22 MED FILL — SOTALOL 80 MG TABLET: 80 | 30 days supply | Qty: 60 | Fill #8

## 2016-04-26 MED FILL — CARVEDILOL 12.5 MG TABLET: 12.5 | 90 days supply | Qty: 180 | Fill #2

## 2016-05-24 MED FILL — VYVANSE 70 MG CAPSULE: 70 | 30 days supply | Qty: 30 | Fill #0

## 2016-05-27 ENCOUNTER — Other Ambulatory Visit (HOSPITAL_COMMUNITY): Payer: Self-pay | Admitting: Cardiology

## 2016-06-09 ENCOUNTER — Other Ambulatory Visit (HOSPITAL_COMMUNITY): Payer: Self-pay | Admitting: Cardiology

## 2016-06-09 MED ORDER — SACUBITRIL-VALSARTAN 24-26 MG PO TABS: 1.0000 | ORAL_TABLET | Freq: Two times a day (BID) | ORAL | 2 refills | Status: DC

## 2016-06-09 MED FILL — ENTRESTO 24 MG-26 MG TABLET: 24-26 | 30 days supply | Qty: 60 | Fill #0

## 2016-06-09 NOTE — Telephone Encounter (Signed)
Med refilled until follow up Last OV 2016!

## 2016-06-13 ENCOUNTER — Other Ambulatory Visit: Payer: Self-pay | Admitting: Internal Medicine

## 2016-06-13 ENCOUNTER — Other Ambulatory Visit (HOSPITAL_COMMUNITY): Payer: Self-pay | Admitting: Cardiology

## 2016-06-13 MED FILL — SOTALOL 80 MG TABLET: 80 | 30 days supply | Qty: 60 | Fill #9

## 2016-06-14 MED FILL — SPIRONOLACTONE 25 MG TABLET: 25 | 30 days supply | Qty: 30 | Fill #0

## 2016-06-22 MED FILL — VYVANSE 70 MG CAPSULE: 70 | 30 days supply | Qty: 30 | Fill #0

## 2016-07-04 ENCOUNTER — Encounter: Payer: 59 | Admitting: *Deleted

## 2016-07-04 ENCOUNTER — Telehealth: Payer: Self-pay | Admitting: Cardiology

## 2016-07-04 NOTE — Telephone Encounter (Signed)
Spoke with pt and reminded pt of remote transmission that is due today. Pt verbalized understanding.   

## 2016-07-08 ENCOUNTER — Encounter: Payer: Self-pay | Admitting: Cardiology

## 2016-07-13 ENCOUNTER — Other Ambulatory Visit (HOSPITAL_COMMUNITY): Payer: Self-pay | Admitting: Cardiology

## 2016-07-13 MED FILL — ENTRESTO 24 MG-26 MG TABLET: 24-26 | 30 days supply | Qty: 60 | Fill #0

## 2016-07-13 MED FILL — SPIRONOLACTONE 25 MG TABLET: 25 | 30 days supply | Qty: 30 | Fill #0

## 2016-07-13 MED FILL — SOTALOL HCL 80 MG TAB: 80 | 30 days supply | Qty: 60 | Fill #10

## 2016-07-22 MED FILL — VYVANSE 70 MG CAPSULE: 70 | 30 days supply | Qty: 30 | Fill #0

## 2016-08-10 ENCOUNTER — Other Ambulatory Visit: Payer: Self-pay | Admitting: Internal Medicine

## 2016-08-10 ENCOUNTER — Other Ambulatory Visit (HOSPITAL_COMMUNITY): Payer: Self-pay | Admitting: Internal Medicine

## 2016-08-22 ENCOUNTER — Ambulatory Visit (HOSPITAL_COMMUNITY)
Admission: RE | Admit: 2016-08-22 | Discharge: 2016-08-22 | Disposition: A | Discharge status: Home / Self Care | Payer: 59 | Source: Ambulatory Visit | Attending: Cardiology | Admitting: Cardiology

## 2016-08-22 ENCOUNTER — Encounter (HOSPITAL_COMMUNITY): Payer: Self-pay | Admitting: Cardiology

## 2016-08-22 VITALS — BP 122/76 | HR 65 | Wt 214.5 lb

## 2016-08-22 DIAGNOSIS — I429 Cardiomyopathy, unspecified: Secondary | ICD-10-CM | POA: Diagnosis not present

## 2016-08-22 DIAGNOSIS — I7300 Raynaud's syndrome without gangrene: Secondary | ICD-10-CM | POA: Insufficient documentation

## 2016-08-22 DIAGNOSIS — I472 Ventricular tachycardia, unspecified: Secondary | ICD-10-CM

## 2016-08-22 DIAGNOSIS — I5022 Chronic systolic (congestive) heart failure: Secondary | ICD-10-CM | POA: Diagnosis not present

## 2016-08-22 DIAGNOSIS — Z8547 Personal history of malignant neoplasm of testis: Secondary | ICD-10-CM | POA: Insufficient documentation

## 2016-08-22 DIAGNOSIS — I480 Paroxysmal atrial fibrillation: Secondary | ICD-10-CM | POA: Insufficient documentation

## 2016-08-22 DIAGNOSIS — Z95810 Presence of automatic (implantable) cardiac defibrillator: Secondary | ICD-10-CM | POA: Insufficient documentation

## 2016-08-22 DIAGNOSIS — J45909 Unspecified asthma, uncomplicated: Secondary | ICD-10-CM | POA: Insufficient documentation

## 2016-08-22 DIAGNOSIS — I454 Nonspecific intraventricular block: Secondary | ICD-10-CM | POA: Insufficient documentation

## 2016-08-22 DIAGNOSIS — I4891 Unspecified atrial fibrillation: Secondary | ICD-10-CM

## 2016-08-22 DIAGNOSIS — Z7982 Long term (current) use of aspirin: Secondary | ICD-10-CM | POA: Insufficient documentation

## 2016-08-22 LAB — BASIC METABOLIC PANEL
Anion gap: 7 (ref 5–15)
BUN: 17 mg/dL (ref 6–20)
CO2: 27 mmol/L (ref 22–32)
Calcium: 9.1 mg/dL (ref 8.9–10.3)
Chloride: 106 mmol/L (ref 101–111)
Creatinine, Ser: 1.12 mg/dL (ref 0.61–1.24)
GFR calc Af Amer: >60 mL/min (ref >60)
GFR calc non Af Amer: >60 mL/min (ref >60)
Glucose, Bld: 82 mg/dL (ref 65–99)
Potassium: 4.2 mmol/L (ref 3.5–5.1)
Sodium: 140 mmol/L (ref 135–145)

## 2016-08-22 NOTE — Patient Instructions (Signed)
Labs today  Labs in 3 months  Your physician has requested that you have an echocardiogram. Echocardiography is a painless test that uses sound waves to create images of your heart. It provides your doctor with information about the size and shape of your heart and how well your heart's chambers and valves are working. This procedure takes approximately one hour. There are no restrictions for this procedure.  We will contact you in 6 months to schedule your next appointment.

## 2016-08-23 MED FILL — CARVEDILOL 12.5 MG TABLET: 12.5 | 90 days supply | Qty: 180 | Fill #0

## 2016-08-23 MED FILL — ENTRESTO 24 MG-26 MG TABLET: 24-26 | 30 days supply | Qty: 60 | Fill #1

## 2016-08-23 MED FILL — SOTALOL HCL 80 MG TAB: 80 | 30 days supply | Qty: 60 | Fill #0

## 2016-08-23 MED FILL — SPIRONOLACTONE 25 MG TABLET: 25 | 30 days supply | Qty: 30 | Fill #0

## 2016-08-23 MED FILL — VYVANSE 70 MG CAPSULE: 70 | 30 days supply | Qty: 30 | Fill #0

## 2016-08-23 NOTE — Progress Notes (Signed)
Patient ID: [name], male   DOB: 1970/02/01, 46 y.o.   MRN: [id number] EP: Dr. Caryl Comes HF Cardiology: Dr. Aundra Dubin  46 yo with history of nonischemic cardiomyopathy, VT s/p ICD discharges, paroxysmal atrial fibrillation, and ADD presents for CHF clinic evaluation.  Patient has been followed by Dr Caryl Comes.  He has had a nonischemic cardiomyopathy known for at least 5 years now.  Coronary angiography in 3/12 showed no significant disease.  I reviewed today's echo.  This showed EF 30% with diffuse hypokinesis and septal-lateral dyssynchrony (stable compared to prior).  He had testicular cancer treated with chemotherapy in the 1990s but does not seem to have had a cardiotoxic regimen.  He was a relatively heavy drinker in the past but abstinence from ETOH for a year did not improve his EF.  He now drinks moderately, typically 1-2 drinks/day.  He had an episode of VT with syncope and successful ICD discharge in 8/15.  He saw Dr Caryl Comes after this and Coreg was increased.  He has ADD and is taking Vyvanse (has been medicated for this for a long time now).    He was seen in the ER 05/16/14 with fatigue/dyspnea and was found to be in atrial fibrillation with RVR.  He was given IV diltiazem and converted back to NSR.  He was started on amiodarone for maintenance of NSR and sent home from the ER.  He felt bad on  amiodarone: fatigue, nausea, jaw pain, myalgias.  Amiodarone was stopped and he started on sotalol. Echo in 5/16 showed EF still low at 30%.  He had upgrade to Medtronic CRT-D in 6/16.    Since CRT upgrade, he has been feeling very good.  No palpitations.  No exertional dyspnea, walks 5-6 miles/day according to his Fitbit.  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  No chest pain.  No lightheadedness/syncope.  Weight stable. Drinks 1-2 glasses wine on most nights.   Optivol was assessed today: fluid index < threshold with stable thoracic impedance.  7 active hours/day. Rare very short ?AF runs.  BiV  pacing > 99% of the time.   ECG (personally reviewed): NSR, ?biV paced but hard to see pacer spikes, QTc 466 msec  Labs (1/15): K 3.9, creatinine 1.3 Labs (11/15): K 4, creatinine 1.11, proBNP 242 Labs (5/16): K 4.3, creatinine 1.25, HCT 45.7, BNP 468, Mg 2 Labs (4/18): K 4.7, creatinine 1.11  PMH: 1. Nonischemic cardiomyopathy: Known for several years.  LHC (3/12) with normal coronaries.  Cardiac MRI (2/12) with EF 27%, normal RV size/systolic function, no myocardial delayed enhancement.  Echo (2/13) with EF 25-30%, global hypokinesis, RV normal systolic and systolic function.  Echo (5/16) with EF 30%, diffuse hypokinesis, septal-lateral dyssynchrony, grade II diastolic dysfunction, normal RV size and systolic function.  - Upgrade to Medtronic CRT-D in 2017.  2. H/o VT: Has Medtronic ICD.  8/15 episode of syncope due to VT with ICD discharge.  3. ADD 4. 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  Regimen not thought to have been cardiotoxic.  6. Atrial fibrillation: Paroxysmal, rare.  7. Raynauds: Worse with beta blocker.  8. Sleep study (8/17): No OSA.   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   FH: Mother and sister had heart valve replacements.  ROS: All systems reviewed and negative except as per HPI.   Current Outpatient Prescriptions  Medication Sig Dispense Refill  .  albuterol (PROVENTIL HFA;VENTOLIN HFA) 108 (90 BASE) MCG/ACT inhaler Inhale 2 puffs into the lungs 4 (four) times daily as needed for wheezing. 1 Inhaler 5  . aspirin 81 MG tablet Take 81 mg by mouth daily.      . carvedilol (COREG) 12.5 MG tablet Take 1 tablet (12.5 mg total) by mouth 2 (two) times daily. 180 tablet 2  . Coenzyme Q10 (CO Q 10 PO) Take 1 tablet by mouth daily.    . fish oil-omega-3 fatty acids 1000 MG capsule Take 1 g by mouth daily.     . Glucosamine-Chondroit-Vit C-Mn (GLUCOSAMINE  1500 COMPLEX PO) Take 1 tablet by mouth daily.      . Lisdexamfetamine Dimesylate (VYVANSE PO) Take 70 mg by mouth daily.     Marland Kitchen LORazepam (ATIVAN) 0.5 MG tablet Take 0.5 mg by mouth as directed.     . sacubitril-valsartan (ENTRESTO) 24-26 MG Take 1 tablet by mouth 2 (two) times daily. 60 tablet 2  . sildenafil (VIAGRA) 50 MG tablet Take 1 tablet (50 mg total) by mouth daily as needed for erectile dysfunction. 10 tablet 2  . sotalol (BETAPACE) 80 MG tablet TAKE 1 TABLET BY MOUTH EVERY 12 HOURS 60 tablet 8  . spironolactone (ALDACTONE) 25 MG tablet TAKE 1 TABLET BY MOUTH ONCE DAILY 30 tablet 0  . Turmeric 500 MG CAPS Take 1,500 mg by mouth daily.      No current facility-administered medications for this encounter.    BP 122/76   Pulse 65   Wt 214 lb 8 oz (97.3 kg)   SpO2 97%   BMI 25.44 kg/m  General: NAD Neck: No JVD, no thyromegaly or thyroid nodule.  Lungs: Clear to auscultation bilaterally with normal respiratory effort. CV: Nondisplaced PMI.  Heart regular S1/S2, no S3/S4, no murmur.  No peripheral edema.  No carotid bruit.  Normal pedal pulses.  Abdomen: Soft, nontender, no hepatosplenomegaly, no distention.  Skin: Intact without lesions or rashes.  Neurologic: Alert and oriented x 3.  Psych: Normal affect. Extremities: No clubbing or cyanosis.  HEENT: Normal.   Assessment/Plan:  1. Chronic systolic CHF: Nonischemic Cardiomyopathy.  Last echo in 5/16 with EF 30%.  No coronary disease on cath in 2012.  No delayed enhancment on MRI in 2012.  He had chemo for testicular cancer in 1990s but this does not appear to have been a cardiotoxic regimen.  He has been a moderate to heavy consumer of ETOH, but does not seem to have been to such an extent to explain all the cardiomyopathy, and EF did not improve with 1 year of ETOH abstinence (now drinks 1-2 glasses wine on most nights).  No family history of cardiomyopathy.  Possible prior viral myocarditis. NYHA class I-II, feels much better  since CRT upgrade.  He looks euvolemic by exam and Optivol.  - ECG today does not appear to show definite pacer spikes, but on device check today he has been BiV pacing > 99% of the time.  - Continue Coreg, spironolactone, and Entresto at current doses.  - No Lasix needed.  - BMET today and at 3 month intervals after today.  - I will arrange for echo.  Suspect he will have had some improvement in EF with CRT.  If EF is near normal, no changes in meds. If EF remains low, will increase Entresto.  He has not tolerated increasing Coreg in the past due to worsening Raynauds symptoms.  2. VT: History of VT, followed by Dr Caryl Comes.  -  Continue sotalol, QTc ok on ECG today.  3. Atrial fibrillation: Paroxysmal, 1 definite episode noted on 05/16/14.  Has had some very short runs documented on device interrogation.  CHADSVASC = 1 (CHF), so not anticoagulated.   - Continue sotalol.   Loralie Champagne 08/23/2016

## 2016-09-01 ENCOUNTER — Other Ambulatory Visit: Payer: Self-pay | Admitting: Internal Medicine

## 2016-09-12 ENCOUNTER — Other Ambulatory Visit (HOSPITAL_COMMUNITY): Payer: 59

## 2016-09-19 ENCOUNTER — Ambulatory Visit (HOSPITAL_COMMUNITY): Payer: 59 | Attending: Cardiology

## 2016-09-19 ENCOUNTER — Other Ambulatory Visit: Payer: Self-pay

## 2016-09-19 DIAGNOSIS — I77810 Thoracic aortic ectasia: Secondary | ICD-10-CM | POA: Insufficient documentation

## 2016-09-19 DIAGNOSIS — I4891 Unspecified atrial fibrillation: Secondary | ICD-10-CM | POA: Diagnosis not present

## 2016-09-19 DIAGNOSIS — I5022 Chronic systolic (congestive) heart failure: Secondary | ICD-10-CM | POA: Insufficient documentation

## 2016-09-19 DIAGNOSIS — I472 Ventricular tachycardia: Secondary | ICD-10-CM | POA: Insufficient documentation

## 2016-09-19 DIAGNOSIS — I081 Rheumatic disorders of both mitral and tricuspid valves: Secondary | ICD-10-CM | POA: Diagnosis not present

## 2016-09-20 ENCOUNTER — Other Ambulatory Visit (HOSPITAL_COMMUNITY): Payer: Self-pay | Admitting: Internal Medicine

## 2016-09-20 MED FILL — ENTRESTO 24 MG-26 MG TABLET: 24-26 | 30 days supply | Qty: 60 | Fill #2

## 2016-09-20 MED FILL — VYVANSE 70 MG CAPSULE: 70 | 30 days supply | Qty: 30 | Fill #0

## 2016-09-20 MED FILL — SOTALOL HCL 80 MG TAB: 80 | 30 days supply | Qty: 60 | Fill #1

## 2016-09-20 MED FILL — SPIRONOLACTONE 25 MG TABLET: 25 | 30 days supply | Qty: 30 | Fill #0

## 2016-09-26 DIAGNOSIS — F900 Attention-deficit hyperactivity disorder, predominantly inattentive type: Secondary | ICD-10-CM | POA: Diagnosis not present

## 2016-10-21 ENCOUNTER — Other Ambulatory Visit (HOSPITAL_COMMUNITY): Payer: Self-pay | Admitting: Cardiology

## 2016-10-21 MED FILL — SOTALOL HCL 80 MG TAB: 80 | 30 days supply | Qty: 60 | Fill #2

## 2016-10-21 MED FILL — VYVANSE 70 MG CAPSULE: 70 | 30 days supply | Qty: 30 | Fill #0

## 2016-10-21 MED FILL — SPIRONOLACTONE 25 MG TABLET: 25 | 30 days supply | Qty: 30 | Fill #1

## 2016-11-17 MED FILL — ENTRESTO 24 MG-26 MG TABLET: 24-26 | 30 days supply | Qty: 60 | Fill #0

## 2016-11-17 MED FILL — CARVEDILOL 12.5 MG TABS: 12.5 | 90 days supply | Qty: 180 | Fill #1

## 2016-11-17 MED FILL — SPIRONOLACTONE 25 MG TABLET: 25 | 30 days supply | Qty: 30 | Fill #2

## 2016-11-17 MED FILL — SOTALOL HCL 80 MG TAB: 80 | 30 days supply | Qty: 60 | Fill #3

## 2016-11-18 MED FILL — VYVANSE 70 MG CAPSULE: 70 | 30 days supply | Qty: 30 | Fill #0

## 2016-11-21 ENCOUNTER — Ambulatory Visit (HOSPITAL_COMMUNITY)
Admission: RE | Admit: 2016-11-21 | Discharge: 2016-11-21 | Disposition: A | Discharge status: Home / Self Care | Payer: 59 | Source: Ambulatory Visit | Attending: Cardiology | Admitting: Cardiology

## 2016-11-21 DIAGNOSIS — I5022 Chronic systolic (congestive) heart failure: Secondary | ICD-10-CM | POA: Insufficient documentation

## 2016-11-21 LAB — BASIC METABOLIC PANEL
Anion gap: 6 (ref 5–15)
BUN: 16 mg/dL (ref 6–20)
CO2: 28 mmol/L (ref 22–32)
Calcium: 9.4 mg/dL (ref 8.9–10.3)
Chloride: 104 mmol/L (ref 101–111)
Creatinine, Ser: 1.12 mg/dL (ref 0.61–1.24)
GFR calc Af Amer: >60 mL/min (ref >60)
GFR calc non Af Amer: >60 mL/min (ref >60)
Glucose, Bld: 99 mg/dL (ref 65–99)
Potassium: 4.5 mmol/L (ref 3.5–5.1)
Sodium: 138 mmol/L (ref 135–145)

## 2016-12-19 ENCOUNTER — Telehealth (HOSPITAL_COMMUNITY): Payer: Self-pay | Admitting: Cardiology

## 2016-12-19 MED ORDER — SILDENAFIL CITRATE 50 MG PO TABS: 50.0000 mg | ORAL_TABLET | Freq: Every day | ORAL | 2 refills | Status: DC | PRN

## 2016-12-19 MED FILL — SOTALOL HCL 80 MG TAB: 80 | 30 days supply | Qty: 60 | Fill #4

## 2016-12-19 MED FILL — SPIRONOLACTONE 25 MG TABLET: 25 | 30 days supply | Qty: 30 | Fill #3

## 2016-12-19 MED FILL — ENTRESTO 24 MG-26 MG TABLET: 24-26 | 30 days supply | Qty: 60 | Fill #1

## 2016-12-19 MED FILL — SILDENAFIL CITRATE 50 MG TA: 50 | 30 days supply | Qty: 6 | Fill #0

## 2016-12-19 NOTE — Telephone Encounter (Signed)
Patient called to request a new rx for viagra, reports he spoke to Dr about this at last [place] however a script was never sent.  Last script was written in 2013, ok to write?

## 2016-12-19 NOTE — Telephone Encounter (Signed)
Yes ok 

## 2016-12-20 MED FILL — VYVANSE 70 MG CAPSULE: 70 | 30 days supply | Qty: 30 | Fill #0

## 2017-01-19 MED FILL — SOTALOL HCL 80 MG TAB: 80 | 30 days supply | Qty: 60 | Fill #5

## 2017-01-19 MED FILL — ENTRESTO 24 MG-26 MG TABLET: 24-26 | 30 days supply | Qty: 60 | Fill #2

## 2017-01-19 MED FILL — VYVANSE 70 MG CAPSULE: 70 | 30 days supply | Qty: 30 | Fill #0

## 2017-01-19 MED FILL — SPIRONOLACTONE 25 MG TABLET: 25 | 30 days supply | Qty: 30 | Fill #4

## 2017-02-01 ENCOUNTER — Encounter: Payer: Self-pay | Admitting: *Deleted

## 2017-02-10 ENCOUNTER — Encounter: Payer: Self-pay | Admitting: Internal Medicine

## 2017-02-10 ENCOUNTER — Ambulatory Visit (INDEPENDENT_AMBULATORY_CARE_PROVIDER_SITE_OTHER): Payer: No Typology Code available for payment source | Admitting: Internal Medicine

## 2017-02-10 VITALS — BP 110/76 | HR 66 | Ht 77.0 in | Wt 216.0 lb

## 2017-02-10 DIAGNOSIS — I472 Ventricular tachycardia, unspecified: Secondary | ICD-10-CM

## 2017-02-10 DIAGNOSIS — Z95810 Presence of automatic (implantable) cardiac defibrillator: Secondary | ICD-10-CM

## 2017-02-10 DIAGNOSIS — I429 Cardiomyopathy, unspecified: Secondary | ICD-10-CM | POA: Diagnosis not present

## 2017-02-10 DIAGNOSIS — I5022 Chronic systolic (congestive) heart failure: Secondary | ICD-10-CM | POA: Diagnosis not present

## 2017-02-10 LAB — CUP PACEART INCLINIC DEVICE CHECK
Battery Remaining Longevity: 79 mo
Battery Voltage: 2.99 V
Brady Statistic AP VP Percent: 0.17 %
Brady Statistic AP VS Percent: 0.02 %
Brady Statistic AS VP Percent: 98.34 %
Brady Statistic AS VS Percent: 1.46 %
Brady Statistic RA Percent Paced: 0.19 %
Brady Statistic RV Percent Paced: 1.39 %
Date Time Interrogation Session: 20190208100050
HighPow Impedance: 47 Ohm
HighPow Impedance: 61 Ohm
Implantable Lead Implant Date: 20120208
Implantable Lead Implant Date: 20160627
Implantable Lead Implant Date: 20160627
Implantable Lead Location: 753858
Implantable Lead Location: 753860
Implantable Lead Location: 753860
Implantable Lead Model: 185
Implantable Lead Model: 4598
Implantable Lead Model: 5076
Implantable Lead Serial Number: 345409
Implantable Pulse Generator Implant Date: 20160627
Lead Channel Impedance Value: 361 Ohm
Lead Channel Impedance Value: 399 Ohm
Lead Channel Impedance Value: 399 Ohm
Lead Channel Impedance Value: 399 Ohm
Lead Channel Impedance Value: 418 Ohm
Lead Channel Impedance Value: 418 Ohm
Lead Channel Impedance Value: 475 Ohm
Lead Channel Impedance Value: 475 Ohm
Lead Channel Impedance Value: 665 Ohm
Lead Channel Impedance Value: 665 Ohm
Lead Channel Impedance Value: 722 Ohm
Lead Channel Impedance Value: 722 Ohm
Lead Channel Impedance Value: 722 Ohm
Lead Channel Pacing Threshold Amplitude: 0.5 V
Lead Channel Pacing Threshold Amplitude: 0.5 V
Lead Channel Pacing Threshold Amplitude: 0.5 V
Lead Channel Pacing Threshold Pulse Width: 0.4 ms
Lead Channel Pacing Threshold Pulse Width: 0.4 ms
Lead Channel Pacing Threshold Pulse Width: 0.8 ms
Lead Channel Sensing Intrinsic Amplitude: 0.75 mV
Lead Channel Sensing Intrinsic Amplitude: 11.25 mV
Lead Channel Sensing Intrinsic Amplitude: 3.25 mV
Lead Channel Sensing Intrinsic Amplitude: 7.625 mV
Lead Channel Setting Pacing Amplitude: 1 V
Lead Channel Setting Pacing Amplitude: 1.5 V
Lead Channel Setting Pacing Amplitude: 2 V
Lead Channel Setting Pacing Pulse Width: 0.4 ms
Lead Channel Setting Pacing Pulse Width: 0.8 ms
Lead Channel Setting Sensing Sensitivity: 0.45 mV

## 2017-02-10 NOTE — Progress Notes (Signed)
Patient Care Team: Patient, No Pcp Per as PCP - General (General Practice)   HPI  [name] is a 47 y.o. male Seen in followup for ICD implanted for secondary prevention.  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 Most recent ejection fraction was 25% or so.   He has had a history of symptomatic sustained ventricular tachycardia. Discussions were had concerning catheter ablation. It was elected to use sotalol but that was never initiated. He has had recurrent prolonged nonsustained ventricular tachycardia; his device has been programmed to minimize therapy for this.   He ended up with ICD shock 8/15  ATP failed to terminate VT, 24J shock failed and 35 joule shock was successful  There was associated syncope   ECG 5/15 demonstrated left bundle branch block with a QRS duration of 150 ms  He underwent CRT upgrade 6/15   DATE TEST EF   2/12 cMRI  27 %   5/16 Echo 30 %   9/18 Echo 45-50%     Date Cr K  11/18 1.12 4.5          Amiodarone ultimately stopped and sotalol started he is tolerating this without recurrent ventricular tachycardia  Exercise tolerance is much improved.  No edema chest pain.  He ended up getting his mother's old CPAP machine and feels much better not withstanding his "negative sleep study "after a terrible evening of poor sleep at the sleep study   Past Medical History:  Diagnosis Date  . ADHD (attention deficit hyperactivity disorder)   . AICD (automatic cardioverter/defibrillator) present 06/30/2014   BiV ICD Insertion CRT-D  . Asthma   . ETOH abuse   . Heart murmur   . Nonischemic cardiomyopathy ([place])   . PAF (paroxysmal atrial fibrillation) ([place])   . Pneumonia ~ 01/2014  . Syncope    episode occured Feb 2011, ICD placed  . Systolic congestive heart failure ([place])    With ejection fraction of 25%, normal coronaries on Cath 02/2010  . 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([place]) 1998   "right"  . Ventricular  tachycardia [facility])     Past Surgical History:  Procedure Laterality Date  . CARDIAC DEFIBRILLATOR PLACEMENT  2014   "single lead; Medtronic"  . EP IMPLANTABLE DEVICE N/A 06/30/2014   Procedure: BiV ICD Insertion CRT-D;  Surgeon: Deboraha Sprang, MD;  Location: [place] [facility];  Service: Cardiovascular;  Laterality: N/A;  . FRACTURE SURGERY Left ~ 1988  . ORCHIECTOMY Right 1998   with abdominal lymph node dissection  . WRIST FRACTURE SURGERY      Current Outpatient Medications  Medication Sig Dispense Refill  . albuterol (PROVENTIL HFA;VENTOLIN HFA) 108 (90 BASE) MCG/ACT inhaler Inhale 2 puffs into the lungs 4 (four) times daily as needed for wheezing. 1 Inhaler 5  . aspirin 81 MG tablet Take 81 mg by mouth daily.      . carvedilol (COREG) 12.5 MG tablet Take 1 tablet (12.5 mg total) by mouth 2 (two) times daily. 180 tablet 2  . Coenzyme Q10 (CO Q 10 PO) Take 1 tablet by mouth daily.    Marland Kitchen ENTRESTO 24-26 MG TAKE 1 TABLET BY MOUTH 2 TIMES DAILY. 60 tablet 2  . fish oil-omega-3 fatty acids 1000 MG capsule Take 1 g by mouth daily.     . Glucosamine-Chondroit-Vit C-Mn (GLUCOSAMINE 1500 COMPLEX PO) Take 1 tablet by mouth daily.      . sildenafil (VIAGRA) 50 MG tablet Take 1 tablet (  50 mg total) by mouth daily as needed for erectile dysfunction. 10 tablet 2  . sotalol (BETAPACE) 80 MG tablet TAKE 1 TABLET BY MOUTH EVERY 12 HOURS 60 tablet 8  . spironolactone (ALDACTONE) 25 MG tablet TAKE 1 TABLET BY MOUTH ONCE DAILY 30 tablet 6  . Turmeric 500 MG CAPS Take 1,500 mg by mouth daily.     Marland Kitchen VYVANSE 70 MG capsule Take 70 mg by mouth daily.  0   No current facility-administered medications for this visit.    Social History   Socioeconomic History  . Marital status: Married    Spouse name: Not on file  . Number of children: Not on file  . Years of education: Not on file  . Highest education level: Not on file  Social Needs  . Financial resource strain: Not on file  . Food insecurity -  worry: Not on file  . Food insecurity - inability: Not on file  . Transportation needs - medical: Not on file  . Transportation needs - non-medical: Not on file  Occupational History  . Occupation: Part time Programme researcher, broadcasting/film/video  Tobacco Use  . Smoking status: Passive Smoke Exposure - Never Smoker  . Smokeless tobacco: Never Used  . 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 Alcohol use: Yes    Alcohol/week: 16.8 oz    Types: 7 Glasses of wine, 21 Cans of beer per week    Comment: 06/30/2014 "2-6 beer2 or glasses of wine/day; probably 2/3 of these beers"  . Drug use: No  . Sexual activity: Not Currently  Other Topics Concern  . Not on file  Social History Narrative  . Not on file     No Known Allergies  Review of Systems negative except from HPI and PMH  Physical Exam BP 110/76   Pulse 66   Ht 6\' 5"  (1.956 m)   Wt 216 lb (98 kg)   BMI 25.61 kg/m  Well developed and nourished in no acute distress HENT normal Neck supple with JVP-flat Clear Regular rate and rhythm, no murmurs or gallops Abd-soft with active BS No Clubbing cyanosis edema Skin-warm and dry A & Oriented  Grossly normal sensory and motor function But slightly  ECG demonstrates sinus at 66 with P synchronous pacing QRS morphology rS lead I rS lead V1 Intervals 15/13/46     Assessment and  Plan  Nonischemic cardiomyopathy  Congestive heart failure chronic  systolic  Ventricular tachycardia     Orthostatic lightheadedness    Implantable defibrillator-Medtronic  High risk medication surveillance     Significant interval improvement in LV function.  No ventricular tachycardia  Euvolemic; continue current medications.  Surveillance laboratories up-to-date.  He asked about welding; told him he could use a welder under 250 A in keeping the generator behind him  We spent more than 50% of our >25 min visit in face to  face counseling regarding the above

## 2017-02-10 NOTE — Patient Instructions (Addendum)
Medication Instructions:  Your physician recommends that you continue on your current medications as directed. Please refer to the Current Medication list given to you today.  Labwork: None ordered.  Testing/Procedures: None ordered.  Follow-Up: Your physician recommends that you schedule a follow-up appointment in: One year with Dr Caryl Comes  Remote monitoring is used to monitor your ICD from home. This monitoring reduces the number of office visits required to check your device to one time per year. It allows [place] to keep an eye on the functioning of your device to ensure it is working properly. You are scheduled for a device check from home on 05/15/2017. You may send your transmission at any time that day. If you have a wireless device, the transmission will be sent automatically. After your physician reviews your transmission, you will receive a postcard with your next transmission date.   Any Other Special Instructions Will Be Listed Below (If Applicable).     If you need a refill on your cardiac medications before your next appointment, please call your pharmacy.

## 2017-02-17 MED FILL — SOTALOL HCL 80 MG TAB: 80 | 30 days supply | Qty: 60 | Fill #6

## 2017-02-17 MED FILL — VYVANSE 70 MG CAPSULE: 70 | 30 days supply | Qty: 30 | Fill #0

## 2017-02-17 MED FILL — CARVEDILOL 12.5 MG TABS: 12.5 | 30 days supply | Qty: 60 | Fill #2

## 2017-02-17 MED FILL — SPIRONOLACTONE 25 MG TABLET: 25 | 30 days supply | Qty: 30 | Fill #5

## 2017-02-20 ENCOUNTER — Other Ambulatory Visit (HOSPITAL_COMMUNITY): Payer: Self-pay | Admitting: Cardiology

## 2017-02-22 MED FILL — ENTRESTO 24 MG-26 MG TABLET: 24-26 | 30 days supply | Qty: 60 | Fill #0

## 2017-03-20 MED FILL — VYVANSE 70 MG CAPSULE: 70 | 30 days supply | Qty: 30 | Fill #0

## 2017-03-20 MED FILL — ENTRESTO 24 MG-26 MG TABLET: 24-26 | 30 days supply | Qty: 60 | Fill #1

## 2017-03-20 MED FILL — SOTALOL HCL 80 MG TAB: 80 | 30 days supply | Qty: 60 | Fill #7

## 2017-03-20 MED FILL — SPIRONOLACTONE 25 MG TABLET: 25 | 30 days supply | Qty: 30 | Fill #6

## 2017-03-20 MED FILL — CARVEDILOL 12.5 MG TABS: 12.5 | 30 days supply | Qty: 60 | Fill #3

## 2017-04-19 ENCOUNTER — Other Ambulatory Visit (HOSPITAL_COMMUNITY): Payer: Self-pay | Admitting: Cardiology

## 2017-04-19 MED FILL — ENTRESTO 24 MG-26 MG TABLET: 24-26 | 30 days supply | Qty: 60 | Fill #2

## 2017-04-19 MED FILL — VYVANSE 70 MG CAPSULE: 70 | 30 days supply | Qty: 30 | Fill #0

## 2017-04-19 MED FILL — CARVEDILOL 12.5 MG TABS: 12.5 | 30 days supply | Qty: 60 | Fill #4

## 2017-04-19 MED FILL — SOTALOL HCL 80 MG TAB: 80 | 30 days supply | Qty: 60 | Fill #8

## 2017-04-21 ENCOUNTER — Other Ambulatory Visit (HOSPITAL_COMMUNITY): Payer: Self-pay | Admitting: *Deleted

## 2017-05-15 ENCOUNTER — Encounter: Payer: No Typology Code available for payment source | Admitting: *Deleted

## 2017-05-15 ENCOUNTER — Telehealth: Payer: Self-pay | Admitting: Cardiology

## 2017-05-15 NOTE — Telephone Encounter (Signed)
LMOVM reminding pt to send remote transmission.   

## 2017-05-18 ENCOUNTER — Other Ambulatory Visit (HOSPITAL_COMMUNITY): Payer: Self-pay | Admitting: Cardiology

## 2017-05-18 ENCOUNTER — Encounter: Payer: Self-pay | Admitting: Cardiology

## 2017-05-18 ENCOUNTER — Other Ambulatory Visit: Payer: Self-pay | Admitting: Internal Medicine

## 2017-05-18 MED FILL — CARVEDILOL 12.5 MG TABS: 12.5 | 90 days supply | Qty: 180 | Fill #0

## 2017-05-18 MED FILL — SOTALOL HCL 80 MG TAB: 80 | 90 days supply | Qty: 180 | Fill #0

## 2017-05-19 MED FILL — VYVANSE 70 MG CAPSULE: 70 | 30 days supply | Qty: 30 | Fill #0

## 2017-06-08 MED FILL — ENTRESTO 24 MG-26 MG TABLET: 24-26 | 30 days supply | Qty: 60 | Fill #0

## 2017-06-19 MED FILL — VYVANSE 70 MG CAPSULE: 70 | 30 days supply | Qty: 30 | Fill #0

## 2017-07-18 MED FILL — VYVANSE 70 MG CAPSULE: 70 | 30 days supply | Qty: 30 | Fill #0

## 2017-08-17 MED FILL — ENTRESTO 24 MG-26 MG TABLET: 24-26 | 30 days supply | Qty: 60 | Fill #1

## 2017-08-17 MED FILL — CARVEDILOL 12.5 MG TABLET: 12.5 | 90 days supply | Qty: 180 | Fill #1

## 2017-08-17 MED FILL — SOTALOL HCL 80 MG TAB: 80 | 90 days supply | Qty: 180 | Fill #1

## 2017-08-17 MED FILL — VYVANSE 70 MG CAPSULE: 70 | 30 days supply | Qty: 30 | Fill #0

## 2017-08-17 MED FILL — AMPHETAMINE-DEXTROAMPHETAMI: 10 | 90 days supply | Qty: 90 | Fill #0

## 2017-09-14 MED FILL — ENTRESTO 24 MG-26 MG TABLET: 24-26 | 30 days supply | Qty: 60 | Fill #2

## 2017-09-15 MED FILL — VYVANSE 70 MG CAPSULE: 70 | 30 days supply | Qty: 30 | Fill #0

## 2017-10-13 MED FILL — VYVANSE 70 MG CAPSULE: 70 | 30 days supply | Qty: 30 | Fill #0

## 2017-10-16 ENCOUNTER — Other Ambulatory Visit (HOSPITAL_COMMUNITY): Payer: Self-pay | Admitting: Cardiology

## 2017-10-16 MED FILL — ENTRESTO 24 MG-26 MG TABLET: 24-26 | 30 days supply | Qty: 60 | Fill #0

## 2017-11-14 MED FILL — VYVANSE 70 MG CAPSULE: 70 | 30 days supply | Qty: 30 | Fill #0

## 2017-12-14 ENCOUNTER — Other Ambulatory Visit (HOSPITAL_COMMUNITY): Payer: Self-pay | Admitting: Cardiology

## 2017-12-14 ENCOUNTER — Other Ambulatory Visit: Payer: Self-pay | Admitting: Psychiatry

## 2017-12-14 DIAGNOSIS — F900 Attention-deficit hyperactivity disorder, predominantly inattentive type: Secondary | ICD-10-CM

## 2017-12-14 MED ORDER — AMPHETAMINE-DEXTROAMPHETAMINE 10 MG PO TABS: ORAL_TABLET | ORAL | 0 refills | Status: DC

## 2017-12-14 MED FILL — AMPHETAMINE-DEXTROAMPHETAMI: 10 | 30 days supply | Qty: 30 | Fill #0

## 2017-12-14 MED FILL — CARVEDILOL 12.5 MG TABLET: 12.5 | 90 days supply | Qty: 180 | Fill #2

## 2017-12-14 NOTE — Telephone Encounter (Signed)
He also needs his addrall 10 mg 1 at 4pm sent to [facility]   Not in epic yet. I have his paper chart if you need it.

## 2017-12-14 NOTE — Progress Notes (Signed)
Ok adderall refill. He has a vyvanse refill at the pharmacy available.

## 2017-12-15 ENCOUNTER — Other Ambulatory Visit: Payer: Self-pay | Admitting: Psychiatry

## 2017-12-15 DIAGNOSIS — F900 Attention-deficit hyperactivity disorder, predominantly inattentive type: Secondary | ICD-10-CM

## 2017-12-15 MED ORDER — LISDEXAMFETAMINE DIMESYLATE 70 MG PO CAPS: 70.0000 mg | ORAL_CAPSULE | Freq: Every day | ORAL | 0 refills | Status: DC

## 2017-12-15 MED FILL — VYVANSE 70 MG CAPSULE: 70 | 30 days supply | Qty: 30 | Fill #0

## 2017-12-15 NOTE — Progress Notes (Signed)
2 vyvanse refills

## 2017-12-15 NOTE — Telephone Encounter (Signed)
Dr C addressed information.

## 2017-12-21 ENCOUNTER — Other Ambulatory Visit (HOSPITAL_COMMUNITY): Payer: Self-pay

## 2017-12-21 MED ORDER — SACUBITRIL-VALSARTAN 24-26 MG PO TABS: 1.0000 | ORAL_TABLET | Freq: Two times a day (BID) | ORAL | 1 refills | Status: DC

## 2018-01-04 ENCOUNTER — Other Ambulatory Visit (HOSPITAL_COMMUNITY): Payer: Self-pay | Admitting: Cardiology

## 2018-01-04 MED FILL — ENTRESTO 24 MG-26 MG TABLET: 24-26 | 30 days supply | Qty: 60 | Fill #0

## 2018-01-05 ENCOUNTER — Telehealth: Payer: Self-pay

## 2018-01-05 NOTE — Telephone Encounter (Signed)
**Note De-Identified [name] Obfuscation** I have done a Sotalol PA through covermymeds. Key: [id number]

## 2018-01-09 ENCOUNTER — Encounter: Payer: Self-pay | Admitting: Emergency Medicine

## 2018-01-09 DIAGNOSIS — F909 Attention-deficit hyperactivity disorder, unspecified type: Secondary | ICD-10-CM | POA: Insufficient documentation

## 2018-01-09 MED FILL — SOTALOL HCL 80 MG TAB: 80 | 90 days supply | Qty: 180 | Fill #2

## 2018-01-15 MED FILL — VYVANSE 70 MG CAPSULE: 70 | 30 days supply | Qty: 30 | Fill #0

## 2018-02-12 ENCOUNTER — Encounter: Payer: Self-pay | Admitting: Psychiatry

## 2018-02-12 ENCOUNTER — Ambulatory Visit: Payer: No Typology Code available for payment source | Admitting: Psychiatry

## 2018-02-12 VITALS — BP 123/73 | HR 75

## 2018-02-12 DIAGNOSIS — G4726 Circadian rhythm sleep disorder, shift work type: Secondary | ICD-10-CM

## 2018-02-12 DIAGNOSIS — F900 Attention-deficit hyperactivity disorder, predominantly inattentive type: Secondary | ICD-10-CM

## 2018-02-12 MED ORDER — LISDEXAMFETAMINE DIMESYLATE 70 MG PO CAPS: 70.0000 mg | ORAL_CAPSULE | Freq: Every day | ORAL | 0 refills | Status: DC

## 2018-02-12 MED ORDER — AMPHETAMINE-DEXTROAMPHETAMINE 10 MG PO TABS: ORAL_TABLET | ORAL | 0 refills | Status: DC

## 2018-02-12 MED ORDER — AMPHETAMINE-DEXTROAMPHETAMINE 10 MG PO TABS: 10.0000 mg | ORAL_TABLET | Freq: Every day | ORAL | 0 refills | Status: DC

## 2018-02-12 MED FILL — AMPHETAMINE-DEXTROAMPHETAMI: 10 | 30 days supply | Qty: 30 | Fill #0

## 2018-02-12 NOTE — Progress Notes (Signed)
[name] [id number] 1970-06-30 48 y.o.  Subjective:   Patient ID:  [name] is a 49 y.o. (DOB [birth year]) male.  Chief Complaint:  Chief Complaint  Patient presents with  . Follow-up    Medication Management  . ADHD  . Sleeping Problem    HPI [name] presents to the office today for follow-up of ADD.  Satisfied with ADD treatment.  Had trouble with the holidays with schedule and what to do.  This has prompted him to consider another vocation.  Comfortable rut for 6 years.  Needs to change.  Med wise the schedule has been more erratic requiring short-acting stimulant.  Patient reports stable mood and denies depressed or irritable moods.  Patient denies any recent difficulty with anxiety.  Patient denies difficulty with sleep initiation or maintenance. Denies appetite disturbance.  Patient reports that energy and motivation have been good.  Patient denies any difficulty with concentration.  Patient denies any suicidal ideation.  Questions re: author's he read on psychiatric illness.   Review of Systems:  Review of Systems  Neurological: Negative for tremors and weakness.  Psychiatric/Behavioral: Negative for agitation, behavioral problems, confusion, decreased concentration, dysphoric mood, hallucinations, self-injury, sleep disturbance and suicidal ideas. The patient is not nervous/anxious and is not hyperactive.     Medications: I have reviewed the patient's current medications.  Current Outpatient Medications  Medication Sig Dispense Refill  . albuterol (PROVENTIL HFA;VENTOLIN HFA) 108 (90 BASE) MCG/ACT inhaler Inhale 2 puffs into the lungs 4 (four) times daily as needed for wheezing. 1 Inhaler 5  . amphetamine-dextroamphetamine (ADDERALL) 10 MG tablet 1 tablet at 4 pm 30 tablet 0  . aspirin 81 MG tablet Take 81 mg by mouth daily.      . carvedilol (COREG) 12.5 MG tablet TAKE 1 TABLET BY MOUTH TWICE DAILY 180 tablet 2  . Coenzyme Q10 (CO Q 10  PO) Take 1 tablet by mouth daily.    Marland Kitchen ENTRESTO 24-26 MG TAKE 1 TABLET BY MOUTH TWICE DAILY (NEED OFFICE VISIT WITH DR Aundra Dubin [phone]) 60 tablet 0  . Glucosamine-Chondroit-Vit C-Mn (GLUCOSAMINE 1500 COMPLEX PO) Take 1 tablet by mouth daily.      Marland Kitchen lisdexamfetamine (VYVANSE) 70 MG capsule Take 1 capsule (70 mg total) by mouth daily. 30 capsule 0  . lisdexamfetamine (VYVANSE) 70 MG capsule Take 1 capsule (70 mg total) by mouth daily. 30 capsule 0  . Multiple Vitamin (MULTIVITAMIN WITH MINERALS) TABS tablet Take 1 tablet by mouth daily.    . sildenafil (VIAGRA) 50 MG tablet Take 1 tablet (50 mg total) by mouth daily as needed for erectile dysfunction. 10 tablet 2  . sotalol (BETAPACE) 80 MG tablet TAKE 1 TABLET BY MOUTH EVERY 12 HOURS 180 tablet 2  . Turmeric 500 MG CAPS Take 1,500 mg by mouth daily.     . fish oil-omega-3 fatty acids 1000 MG capsule Take 1 g by mouth daily.     Marland Kitchen spironolactone (ALDACTONE) 25 MG tablet TAKE 1 TABLET BY MOUTH ONCE DAILY (Patient not taking: Reported on 02/12/2018) 30 tablet 6   No current facility-administered medications for this visit.     Medication Side Effects: None  Allergies: No Known Allergies  Past Medical History:  Diagnosis Date  . ADHD (attention deficit hyperactivity disorder)   . AICD (automatic cardioverter/defibrillator) present 06/30/2014   BiV ICD Insertion CRT-D  . Asthma   . ETOH abuse   . Heart murmur   . Nonischemic cardiomyopathy ([place])   . PAF (  paroxysmal atrial fibrillation) ([place])   . Pneumonia ~ 01/2014  . Syncope    episode occured Feb 2011, ICD placed  . Systolic congestive heart failure ([place])    With ejection fraction of 25%, normal coronaries on Cath 02/2010  . 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([place]) 1998   "right"  . Ventricular tachycardia ([facility])     Family History  Problem Relation Age of Onset  . Heart disease Mother        mother and sister with valve problems  . Pancreatic cancer Maternal Grandfather   . Cardiomyopathy Neg  Hx     Social History   Socioeconomic History  . Marital status: Married    Spouse name: Not on file  . Number of children: Not on file  . Years of education: Not on file  . Highest education level: Not on file  Occupational History  . Occupation: Part time Programme researcher, broadcasting/film/video  Social Needs  . Financial resource strain: Not on file  . Food insecurity:    Worry: Not on file    Inability: Not on file  . Transportation needs:    Medical: Not on file    Non-medical: Not on file  Tobacco Use  . Smoking status: Passive Smoke Exposure - Never Smoker  . Smokeless tobacco: Never Used  . 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 Alcohol use: Yes    Alcohol/week: 28.0 standard drinks    Types: 7 Glasses of wine, 21 Cans of beer per week    Comment: 06/30/2014 "2-6 beer2 or glasses of wine/day; probably 2/3 of these beers"  . Drug use: No  . Sexual activity: Not Currently  Lifestyle  . Physical activity:    Days per week: Not on file    Minutes per session: Not on file  . Stress: Not on file  Relationships  . Social connections:    Talks on phone: Not on file    Gets together: Not on file    Attends religious service: Not on file    Active member of club or organization: Not on file    Attends meetings of clubs or organizations: Not on file    Relationship status: Not on file  . Intimate partner violence:    Fear of current or ex partner: Not on file    Emotionally abused: Not on file    Physically abused: Not on file    Forced sexual activity: Not on file  Other Topics Concern  . Not on file  Social History Narrative  . Not on file    Past Medical History, Surgical history, Social history, and Family history were reviewed and updated as appropriate.   Please see review of systems for further details on the patient's review from today.   Objective:   Physical Exam:  BP 123/73 (BP Location: Left  Arm)   Pulse 75   Physical Exam Constitutional:      General: He is not in acute distress.    Appearance: He is well-developed.  Musculoskeletal:        General: No deformity.  Neurological:     Mental Status: He is alert and oriented to person, place, and time.     Motor: No tremor.     Coordination: Coordination normal.     Gait: Gait normal.  Psychiatric:        Attention and Perception: Attention normal. He is attentive. He does  not perceive auditory hallucinations.        Mood and Affect: Mood normal. Mood is not anxious or depressed. Affect is not labile, blunt, angry or inappropriate.        Speech: Speech normal.        Behavior: Behavior normal.        Thought Content: Thought content normal. Thought content does not include homicidal or suicidal ideation. Thought content does not include homicidal or suicidal plan.        Cognition and Memory: Cognition normal.        Judgment: Judgment normal.     Comments: Insight is good.     Lab Review:     Component Value Date/Time   NA 138 11/21/2016 1417   NA 140 04/04/2016 1122   K 4.5 11/21/2016 1417   CL 104 11/21/2016 1417   CO2 28 11/21/2016 1417   GLUCOSE 99 11/21/2016 1417   BUN 16 11/21/2016 1417   BUN 18 04/04/2016 1122   CREATININE 1.12 11/21/2016 1417   CREATININE 1.17 07/08/2015 0835   CALCIUM 9.4 11/21/2016 1417   GFRNONAA >60 11/21/2016 1417   GFRAA >60 11/21/2016 1417       Component Value Date/Time   WBC 7.6 11/25/2015 0102   RBC 4.34 11/25/2015 0102   HGB 14.3 11/25/2015 0102   HCT 42.3 11/25/2015 0102   PLT 204 11/25/2015 0102   MCV 97.5 11/25/2015 0102   MCV 95.8 01/19/2014 1609   MCH 32.9 11/25/2015 0102   MCHC 33.8 11/25/2015 0102   RDW 12.8 11/25/2015 0102   LYMPHSABS 1.2 06/23/2014 1139   MONOABS 0.4 06/23/2014 1139   EOSABS 0.4 06/23/2014 1139   BASOSABS 0.0 06/23/2014 1139    No results found for: POCLITH, LITHIUM   No results found for: PHENYTOIN, PHENOBARB, VALPROATE, CBMZ    .res Assessment: Plan:    Attention deficit hyperactivity disorder (ADHD), predominantly inattentive type  Shift work sleep disorder   No change indicated.  But disc the alternative types of stimulants and durations and SE.  Supportive therapy on job seeking.  Working on this.  Has a plan.    FU 6 mos  Lynder Parents, MD, DFAPA   Please see After Visit Summary for patient specific instructions.  Future Appointments  Date Time Provider [place]  03/02/2018 10:40 AM Larey Dresser, MD [facility] None    No orders of the defined types were placed in this encounter.     -------------------------------

## 2018-02-13 ENCOUNTER — Other Ambulatory Visit (HOSPITAL_COMMUNITY): Payer: Self-pay | Admitting: Cardiology

## 2018-02-13 MED FILL — ENTRESTO 24 MG-26 MG TABLET: 24-26 | 30 days supply | Qty: 60 | Fill #0

## 2018-02-14 MED FILL — VYVANSE 70 MG CAPSULE: 70 | 30 days supply | Qty: 30 | Fill #0

## 2018-03-02 ENCOUNTER — Ambulatory Visit (HOSPITAL_COMMUNITY)
Admission: RE | Admit: 2018-03-02 | Discharge: 2018-03-02 | Disposition: A | Discharge status: Home / Self Care | Payer: No Typology Code available for payment source | Source: Ambulatory Visit | Attending: Cardiology | Admitting: Cardiology

## 2018-03-02 VITALS — BP 100/58 | HR 71 | Wt 222.0 lb

## 2018-03-02 DIAGNOSIS — Z7901 Long term (current) use of anticoagulants: Secondary | ICD-10-CM | POA: Insufficient documentation

## 2018-03-02 DIAGNOSIS — I480 Paroxysmal atrial fibrillation: Secondary | ICD-10-CM | POA: Insufficient documentation

## 2018-03-02 DIAGNOSIS — J45909 Unspecified asthma, uncomplicated: Secondary | ICD-10-CM | POA: Diagnosis not present

## 2018-03-02 DIAGNOSIS — I5022 Chronic systolic (congestive) heart failure: Secondary | ICD-10-CM | POA: Diagnosis present

## 2018-03-02 DIAGNOSIS — I428 Other cardiomyopathies: Secondary | ICD-10-CM | POA: Insufficient documentation

## 2018-03-02 DIAGNOSIS — Z7982 Long term (current) use of aspirin: Secondary | ICD-10-CM | POA: Diagnosis not present

## 2018-03-02 DIAGNOSIS — I7300 Raynaud's syndrome without gangrene: Secondary | ICD-10-CM | POA: Diagnosis not present

## 2018-03-02 DIAGNOSIS — Z8249 Family history of ischemic heart disease and other diseases of the circulatory system: Secondary | ICD-10-CM | POA: Insufficient documentation

## 2018-03-02 DIAGNOSIS — Z79899 Other long term (current) drug therapy: Secondary | ICD-10-CM | POA: Diagnosis not present

## 2018-03-02 LAB — LIPID PANEL
Cholesterol: 200 mg/dL (ref 0–200)
HDL: 63 mg/dL (ref >40)
LDL Cholesterol: 103 mg/dL — ABNORMAL HIGH (ref 0–99)
Total CHOL/HDL Ratio: 3.2 RATIO
Triglycerides: 172 mg/dL — ABNORMAL HIGH (ref <150)
VLDL: 34 mg/dL (ref 0–40)

## 2018-03-02 LAB — BASIC METABOLIC PANEL
Anion gap: 11 (ref 5–15)
BUN: 11 mg/dL (ref 6–20)
CO2: 23 mmol/L (ref 22–32)
Calcium: 9.2 mg/dL (ref 8.9–10.3)
Chloride: 105 mmol/L (ref 98–111)
Creatinine, Ser: 1 mg/dL (ref 0.61–1.24)
GFR calc Af Amer: >60 mL/min (ref >60)
GFR calc non Af Amer: >60 mL/min (ref >60)
Glucose, Bld: 90 mg/dL (ref 70–99)
Potassium: 4.1 mmol/L (ref 3.5–5.1)
Sodium: 139 mmol/L (ref 135–145)

## 2018-03-02 NOTE — Patient Instructions (Addendum)
Labs today and repeat every 3 months. Please call for future appointments. We will only contact you if something comes back abnormal or we need to make some changes. Otherwise no news is good news!  Your physician recommends that you schedule a follow-up appointment in: 6 months with Dr. Aundra Dubin, please call in August if you do not get a call to make this appointment.

## 2018-03-04 NOTE — Progress Notes (Signed)
Patient ID: [name], male   DOB: 19-Oct-1970, 48 y.o.   MRN: [id number] EP: Dr. Caryl Comes HF Cardiology: Dr. Aundra Dubin  48 y.o. with history of nonischemic cardiomyopathy, VT s/p ICD discharges, paroxysmal atrial fibrillation, and ADD presents for CHF clinic evaluation.  Patient has been followed by Dr Caryl Comes.  He has had a nonischemic cardiomyopathy known for at least 5 years now.  Coronary angiography in 3/12 showed no significant disease.  I reviewed today's echo.  This showed EF 30% with diffuse hypokinesis and septal-lateral dyssynchrony (stable compared to prior).  He had testicular cancer treated with chemotherapy in the 1990s but does not seem to have had a cardiotoxic regimen.  He was a relatively heavy drinker in the past but abstinence from ETOH for a year did not improve his EF.  He now drinks moderately, typically 1-2 drinks/day.  He had an episode of VT with syncope and successful ICD discharge in 8/15.  He saw Dr Caryl Comes after this and Coreg was increased.  He has ADD and is taking Vyvanse (has been medicated for this for a long time now).    He was seen in the ER 05/16/14 with fatigue/dyspnea and was found to be in atrial fibrillation with RVR.  He was given IV diltiazem and converted back to NSR.  He was started on amiodarone for maintenance of NSR and sent home from the ER.  He felt bad on  amiodarone: fatigue, nausea, jaw pain, myalgias.  Amiodarone was stopped and he started on sotalol. Echo in 5/16 showed EF still low at 30%.  He had upgrade to Medtronic CRT-D in 6/16.  Echo in 9/18 with EF 45-50%, moderate LV dilation.   He returns for followup of CHF. No significant exertional dyspnea.  No orthopnea/PND.  No lightheadedness.  No chest pain.  Weight stable.   Medtronic device interrogation: >99% BiV pacing, no AF/VT, stable thoracic impedance.    ECG (personally reviewed): NSR, BiV pacing  Labs (1/15): K 3.9, creatinine 1.3 Labs (11/15): K 4, creatinine 1.11, proBNP 242 Labs  (5/16): K 4.3, creatinine 1.25, HCT 45.7, BNP 468, Mg 2 Labs (4/18): K 4.7, creatinine 1.11  PMH: 1. Nonischemic cardiomyopathy: Known for several years.  LHC (3/12) with normal coronaries.  Cardiac MRI (2/12) with EF 27%, normal RV size/systolic function, no myocardial delayed enhancement.  Echo (2/13) with EF 25-30%, global hypokinesis, RV normal systolic and systolic function.  Echo (5/16) with EF 30%, diffuse hypokinesis, septal-lateral dyssynchrony, grade II diastolic dysfunction, normal RV size and systolic function.  - Upgrade to Medtronic CRT-D in 2017.  - Echo (9/18): EF 45-50%, moderate LV dilation 2. H/o VT: Has Medtronic ICD.  8/15 episode of syncope due to VT with ICD discharge.  3. ADD 4. 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  Regimen not thought to have been cardiotoxic.  6. Atrial fibrillation: Paroxysmal, rare.  7. Raynauds: Worse with beta blocker.  8. Sleep study (8/17): No OSA.   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   FH: Mother and sister had heart valve replacements.  ROS: All systems reviewed and negative except as per HPI.   Current Outpatient Medications  Medication Sig Dispense Refill  . albuterol (PROVENTIL HFA;VENTOLIN HFA) 108 (90 BASE) MCG/ACT inhaler Inhale 2 puffs into the lungs 4 (four) times daily as needed for wheezing. 1 Inhaler 5  . amphetamine-dextroamphetamine (ADDERALL) 10 MG tablet  1 tablet at 4 pm 30 tablet 0  . aspirin 81 MG tablet Take 81 mg by mouth daily.      . carvedilol (COREG) 12.5 MG tablet TAKE 1 TABLET BY MOUTH TWICE DAILY 180 tablet 2  . Coenzyme Q10 (CO Q 10 PO) Take 1 tablet by mouth daily.    Marland Kitchen ENTRESTO 24-26 MG TAKE 1 TABLET BY MOUTH TWICE DAILY (NEED OFFICE VISIT WITH DR Aundra Dubin [phone]) 60 tablet 0  . fish oil-omega-3 fatty acids 1000 MG capsule Take 1 g by mouth daily.     . Glucosamine-Chondroit-Vit  C-Mn (GLUCOSAMINE 1500 COMPLEX PO) Take 1 tablet by mouth daily.      Marland Kitchen lisdexamfetamine (VYVANSE) 70 MG capsule Take 1 capsule (70 mg total) by mouth daily. 30 capsule 0  . [START ON 04/09/2018] lisdexamfetamine (VYVANSE) 70 MG capsule Take 1 capsule (70 mg total) by mouth daily. 30 capsule 0  . Multiple Vitamin (MULTIVITAMIN WITH MINERALS) TABS tablet Take 1 tablet by mouth daily.    . sildenafil (VIAGRA) 50 MG tablet Take 1 tablet (50 mg total) by mouth daily as needed for erectile dysfunction. 10 tablet 2  . sotalol (BETAPACE) 80 MG tablet TAKE 1 TABLET BY MOUTH EVERY 12 HOURS 180 tablet 2  . spironolactone (ALDACTONE) 25 MG tablet TAKE 1 TABLET BY MOUTH ONCE DAILY 30 tablet 6  . Turmeric 500 MG CAPS Take 1,500 mg by mouth daily.      No current facility-administered medications for this encounter.    BP (!) 100/58   Pulse 71   Wt 100.7 kg (222 lb)   SpO2 96%   BMI 26.33 kg/m  General: NAD Neck: No JVD, no thyromegaly or thyroid nodule.  Lungs: Clear to auscultation bilaterally with normal respiratory effort. CV: Nondisplaced PMI.  Heart regular S1/S2, no S3/S4, no murmur.  No peripheral edema.  No carotid bruit.  Normal pedal pulses.  Abdomen: Soft, nontender, no hepatosplenomegaly, no distention.  Skin: Intact without lesions or rashes.  Neurologic: Alert and oriented x 3.  Psych: Normal affect. Extremities: No clubbing or cyanosis.  HEENT: Normal.   Assessment/Plan:  1. Chronic systolic CHF: Nonischemic Cardiomyopathy.  Last echo in 5/16 with EF 30%.  No coronary disease on cath in 2012.  No delayed enhancment on MRI in 2012.  He had chemo for testicular cancer in 1990s but this does not appear to have been a cardiotoxic regimen.  He has been a moderate to heavy consumer of ETOH, but does not seem to have been to such an extent to explain all the cardiomyopathy, and EF did not improve with 1 year of ETOH abstinence (now drinks occasional ETOH).  No family history of cardiomyopathy.   Possible prior viral myocarditis. NYHA class I, feels much better since CRT upgrade.  He looks euvolemic by exam and Optivol.  He is appropriately BiV pacing.  - Continue Coreg, spironolactone, and Entresto at current doses. BMET today then every 3 months.  - No Lasix needed.  - I will arrange for repeat echo at followup appt in 6 months.  2. VT: History of VT, followed by Dr Caryl Comes.  - Continue sotalol, QTc ok on ECG today.  3. Atrial fibrillation: Paroxysmal, 1 definite episode noted on 05/16/14.  Has had some very short runs documented on device interrogation.  CHADSVASC = 1 (CHF), so not anticoagulated.   - Continue sotalol.   Followup in 6 months with echo.   Loralie Champagne 03/04/2018

## 2018-03-05 ENCOUNTER — Encounter (HOSPITAL_COMMUNITY): Payer: Self-pay | Admitting: *Deleted

## 2018-03-15 ENCOUNTER — Other Ambulatory Visit (HOSPITAL_COMMUNITY): Payer: Self-pay | Admitting: Cardiology

## 2018-03-15 MED FILL — ENTRESTO 24 MG-26 MG TABLET: 24-26 | 30 days supply | Qty: 60 | Fill #0

## 2018-03-16 MED FILL — VYVANSE 70 MG CAPSULE: 70 | 30 days supply | Qty: 30 | Fill #0

## 2018-04-13 ENCOUNTER — Other Ambulatory Visit: Payer: Self-pay | Admitting: Internal Medicine

## 2018-04-13 ENCOUNTER — Other Ambulatory Visit (HOSPITAL_COMMUNITY): Payer: Self-pay | Admitting: Cardiology

## 2018-04-13 MED FILL — CARVEDILOL 12.5 MG TABLET: 12.5 | 90 days supply | Qty: 180 | Fill #0

## 2018-04-13 MED FILL — SOTALOL HCL 80 MG TAB: 80 | 90 days supply | Qty: 180 | Fill #0

## 2018-04-16 MED FILL — VYVANSE 70 MG CAPSULE: 70 | 30 days supply | Qty: 30 | Fill #0

## 2018-04-16 MED FILL — ENTRESTO 24 MG-26 MG TABLET: 24-26 | 90 days supply | Qty: 180 | Fill #0

## 2018-05-03 ENCOUNTER — Encounter: Payer: No Typology Code available for payment source | Admitting: *Deleted

## 2018-05-03 ENCOUNTER — Other Ambulatory Visit: Payer: Self-pay

## 2018-05-15 ENCOUNTER — Other Ambulatory Visit: Payer: Self-pay | Admitting: Psychiatry

## 2018-05-15 DIAGNOSIS — F900 Attention-deficit hyperactivity disorder, predominantly inattentive type: Secondary | ICD-10-CM

## 2018-05-15 MED ORDER — LISDEXAMFETAMINE DIMESYLATE 70 MG PO CAPS: 70.0000 mg | ORAL_CAPSULE | Freq: Every day | ORAL | 0 refills | Status: DC

## 2018-05-15 MED FILL — VYVANSE 70 MG CAPSULE: 70 | 30 days supply | Qty: 30 | Fill #0

## 2018-05-15 NOTE — Telephone Encounter (Signed)
submitted

## 2018-05-16 MED FILL — AMPHETAMINE-DEXTROAMPHETAMI: 10 | 30 days supply | Qty: 30 | Fill #0

## 2018-05-17 ENCOUNTER — Telehealth: Payer: Self-pay | Admitting: Cardiology

## 2018-05-17 NOTE — Telephone Encounter (Signed)
LMOVM for pt to return call b/c of lost to follow up. Pt will need to send a transmission. We have never received a transmission from his home monitor he may need help sending the transmission.

## 2018-06-04 ENCOUNTER — Other Ambulatory Visit (HOSPITAL_COMMUNITY): Payer: No Typology Code available for payment source

## 2018-06-13 MED FILL — VYVANSE 70 MG CAPSULE: 70 | 30 days supply | Qty: 30 | Fill #0

## 2018-06-18 ENCOUNTER — Ambulatory Visit (INDEPENDENT_AMBULATORY_CARE_PROVIDER_SITE_OTHER): Payer: No Typology Code available for payment source | Admitting: *Deleted

## 2018-06-18 DIAGNOSIS — I472 Ventricular tachycardia, unspecified: Secondary | ICD-10-CM

## 2018-06-18 DIAGNOSIS — I5022 Chronic systolic (congestive) heart failure: Secondary | ICD-10-CM

## 2018-06-18 DIAGNOSIS — I429 Cardiomyopathy, unspecified: Secondary | ICD-10-CM

## 2018-06-19 LAB — CUP PACEART REMOTE DEVICE CHECK
Battery Remaining Longevity: 49 mo
Battery Voltage: 2.97 V
Brady Statistic AP VP Percent: 0.08 %
Brady Statistic AP VS Percent: 0.01 %
Brady Statistic AS VP Percent: 98.49 %
Brady Statistic AS VS Percent: 1.42 %
Brady Statistic RA Percent Paced: 0.09 %
Brady Statistic RV Percent Paced: 2.25 %
Date Time Interrogation Session: 20200615220035
HighPow Impedance: 47 Ohm
HighPow Impedance: 62 Ohm
Implantable Lead Implant Date: 20120208
Implantable Lead Implant Date: 20160627
Implantable Lead Implant Date: 20160627
Implantable Lead Location: 753858
Implantable Lead Location: 753860
Implantable Lead Location: 753860
Implantable Lead Model: 185
Implantable Lead Model: 4598
Implantable Lead Model: 5076
Implantable Lead Serial Number: 345409
Implantable Pulse Generator Implant Date: 20160627
Lead Channel Impedance Value: 399 Ohm
Lead Channel Impedance Value: 418 Ohm
Lead Channel Impedance Value: 418 Ohm
Lead Channel Impedance Value: 418 Ohm
Lead Channel Impedance Value: 456 Ohm
Lead Channel Impedance Value: 456 Ohm
Lead Channel Impedance Value: 456 Ohm
Lead Channel Impedance Value: 551 Ohm
Lead Channel Impedance Value: 722 Ohm
Lead Channel Impedance Value: 760 Ohm
Lead Channel Impedance Value: 760 Ohm
Lead Channel Impedance Value: 760 Ohm
Lead Channel Impedance Value: 779 Ohm
Lead Channel Pacing Threshold Amplitude: 0.5 V
Lead Channel Pacing Threshold Amplitude: 0.5 V
Lead Channel Pacing Threshold Amplitude: 1.875 V
Lead Channel Pacing Threshold Pulse Width: 0.4 ms
Lead Channel Pacing Threshold Pulse Width: 0.4 ms
Lead Channel Pacing Threshold Pulse Width: 0.4 ms
Lead Channel Sensing Intrinsic Amplitude: 1.5 mV
Lead Channel Sensing Intrinsic Amplitude: 1.5 mV
Lead Channel Sensing Intrinsic Amplitude: 7.375 mV
Lead Channel Sensing Intrinsic Amplitude: 7.375 mV
Lead Channel Setting Pacing Amplitude: 1 V
Lead Channel Setting Pacing Amplitude: 1.5 V
Lead Channel Setting Pacing Amplitude: 2 V
Lead Channel Setting Pacing Pulse Width: 0.4 ms
Lead Channel Setting Pacing Pulse Width: 0.8 ms
Lead Channel Setting Sensing Sensitivity: 0.45 mV

## 2018-06-25 ENCOUNTER — Encounter: Payer: Self-pay | Admitting: Cardiology

## 2018-06-25 NOTE — Progress Notes (Signed)
Remote ICD transmission.   

## 2018-07-11 ENCOUNTER — Other Ambulatory Visit: Payer: Self-pay | Admitting: Internal Medicine

## 2018-07-11 ENCOUNTER — Other Ambulatory Visit (HOSPITAL_COMMUNITY): Payer: Self-pay | Admitting: Cardiology

## 2018-07-11 ENCOUNTER — Other Ambulatory Visit: Payer: Self-pay | Admitting: Psychiatry

## 2018-07-11 DIAGNOSIS — F900 Attention-deficit hyperactivity disorder, predominantly inattentive type: Secondary | ICD-10-CM

## 2018-07-11 MED FILL — SOTALOL HCL 80 MG TAB: 80 | 30 days supply | Qty: 60 | Fill #0

## 2018-07-11 MED FILL — CARVEDILOL 12.5 MG TABLET: 12.5 | 30 days supply | Qty: 60 | Fill #0

## 2018-07-11 MED FILL — VYVANSE 70 MG CAPSULE: 70 | 30 days supply | Qty: 30 | Fill #0

## 2018-07-12 MED FILL — ENTRESTO 24 MG-26 MG TABLET: 24-26 | 30 days supply | Qty: 60 | Fill #0

## 2018-07-13 MED FILL — AMPHETAMINE-DEXTROAMPHETAMI: 10 | 30 days supply | Qty: 30 | Fill #0

## 2018-07-13 NOTE — Telephone Encounter (Signed)
Has appt 08/17 Last fill 05/13

## 2018-08-13 ENCOUNTER — Other Ambulatory Visit: Payer: Self-pay | Admitting: Psychiatry

## 2018-08-13 NOTE — Telephone Encounter (Signed)
Has appt 08/17

## 2018-08-14 ENCOUNTER — Encounter: Payer: Self-pay | Admitting: Internal Medicine

## 2018-08-14 ENCOUNTER — Ambulatory Visit (INDEPENDENT_AMBULATORY_CARE_PROVIDER_SITE_OTHER): Payer: No Typology Code available for payment source | Admitting: Internal Medicine

## 2018-08-14 ENCOUNTER — Other Ambulatory Visit: Payer: Self-pay

## 2018-08-14 VITALS — BP 122/62 | HR 76 | Ht 77.0 in | Wt 216.8 lb

## 2018-08-14 DIAGNOSIS — I472 Ventricular tachycardia, unspecified: Secondary | ICD-10-CM

## 2018-08-14 DIAGNOSIS — I5022 Chronic systolic (congestive) heart failure: Secondary | ICD-10-CM

## 2018-08-14 DIAGNOSIS — Z95810 Presence of automatic (implantable) cardiac defibrillator: Secondary | ICD-10-CM

## 2018-08-14 DIAGNOSIS — I429 Cardiomyopathy, unspecified: Secondary | ICD-10-CM

## 2018-08-14 DIAGNOSIS — I4891 Unspecified atrial fibrillation: Secondary | ICD-10-CM | POA: Diagnosis not present

## 2018-08-14 LAB — CUP PACEART INCLINIC DEVICE CHECK
Date Time Interrogation Session: 20200811164003
Implantable Lead Implant Date: 20120208
Implantable Lead Implant Date: 20160627
Implantable Lead Implant Date: 20160627
Implantable Lead Location: 753858
Implantable Lead Location: 753860
Implantable Lead Location: 753860
Implantable Lead Model: 185
Implantable Lead Model: 4598
Implantable Lead Model: 5076
Implantable Lead Serial Number: 345409
Implantable Pulse Generator Implant Date: 20160627

## 2018-08-14 MED FILL — VYVANSE 70 MG CAPSULE: 70 | 30 days supply | Qty: 30 | Fill #0

## 2018-08-14 NOTE — Telephone Encounter (Signed)
He's out today if you can send please

## 2018-08-14 NOTE — Progress Notes (Signed)
Patient Care Team: Patient, No Pcp Per as PCP - General (General Practice)   HPI  [name] is a 48 y.o. male Seen in followup for ICD implanted for secondary prevention.  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 Most recent ejection fraction was 25% or so.   He has had a history of symptomatic sustained ventricular tachycardia. Discussions were had concerning catheter ablation. It was elected to use sotalol but that was never initiated. He has had recurrent prolonged nonsustained ventricular tachycardia; his device has been programmed to minimize therapy for this.   He ended up with ICD shock 8/15  ATP failed to terminate VT, 24J shock failed and 35 joule shock was successful  There was associated syncope   ECG 5/15 demonstrated left bundle branch block with a QRS duration of 150 ms  He underwent CRT upgrade 6/15   DATE TEST EF   2/12 cMRI  27 %   5/16 Echo 30 %   9/18 Echo 45-50%     Date Cr K  11/18 1.12 4.5          Amiodarone ultimately stopped and sotalol started he is tolerating this without recurrent ventricular tachycardia   Exercise tolerance remains good The patient denies chest pain, shortness of breath, nocturnal dyspnea, orthopnea or peripheral edema.  There have been no palpitations, lightheadedness or syncope.    concersn regarding costs we COVID    Past Medical History:  Diagnosis Date  . ADHD (attention deficit hyperactivity disorder)   . AICD (automatic cardioverter/defibrillator) present 06/30/2014   BiV ICD Insertion CRT-D  . Asthma   . ETOH abuse   . Heart murmur   . Nonischemic cardiomyopathy ([place])   . PAF (paroxysmal atrial fibrillation) ([place])   . Pneumonia ~ 01/2014  . Syncope    episode occured Feb 2011, ICD placed  . Systolic congestive heart failure ([place])    With ejection fraction of 25%, normal coronaries on Cath 02/2010  . 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([place]) 1998   "right"  . Ventricular  tachycardia [facility])     Past Surgical History:  Procedure Laterality Date  . CARDIAC DEFIBRILLATOR PLACEMENT  2014   "single lead; Medtronic"  . EP IMPLANTABLE DEVICE N/A 06/30/2014   Procedure: BiV ICD Insertion CRT-D;  Surgeon: Deboraha Sprang, MD;  Location: [facility] CV LAB;  Service: Cardiovascular;  Laterality: N/A;  . FRACTURE SURGERY Left ~ 1988  . ORCHIECTOMY Right 1998   with abdominal lymph node dissection  . WRIST FRACTURE SURGERY      Current Outpatient Medications  Medication Sig Dispense Refill  . albuterol (PROVENTIL HFA;VENTOLIN HFA) 108 (90 BASE) MCG/ACT inhaler Inhale 2 puffs into the lungs 4 (four) times daily as needed for wheezing. 1 Inhaler 5  . amphetamine-dextroamphetamine (ADDERALL) 10 MG tablet Take 10 mg by mouth as needed.    Marland Kitchen aspirin 81 MG tablet Take 81 mg by mouth daily.      . carvedilol (COREG) 12.5 MG tablet TAKE 1 TABLET BY MOUTH 2 TIMES DAILY. PLEASE KEEP UPCOMING APPT IN APRIL FOR MORE REFILLS 60 tablet 0  . Coenzyme Q10 (CO Q 10 PO) Take 1 tablet by mouth daily.    . fish oil-omega-3 fatty acids 1000 MG capsule Take 1 g by mouth daily.     . Glucosamine-Chondroit-Vit C-Mn (GLUCOSAMINE 1500 COMPLEX PO) Take 1 tablet by mouth daily.      Marland Kitchen lisdexamfetamine (VYVANSE) 70 MG capsule Take 1  capsule (70 mg total) by mouth daily. 30 capsule 0  . Multiple Vitamin (MULTIVITAMIN WITH MINERALS) TABS tablet Take 1 tablet by mouth daily.    . sacubitril-valsartan (ENTRESTO) 24-26 MG Take 1 tablet by mouth 2 (two) times daily. 60 tablet 2  . sildenafil (VIAGRA) 50 MG tablet Take 1 tablet (50 mg total) by mouth daily as needed for erectile dysfunction. 10 tablet 2  . sotalol (BETAPACE) 80 MG tablet TAKE 1 TABLET BY MOUTH EVERY 12 HOURS. PLEASE KEEP UPCOMING APPT IN APRIL FOR MORE REFILLS 60 tablet 0  . spironolactone (ALDACTONE) 25 MG tablet TAKE 1 TABLET BY MOUTH ONCE DAILY 30 tablet 6  . Turmeric 500 MG CAPS Take 1,500 mg by mouth daily.      No current  facility-administered medications for this visit.    Social History   Socioeconomic History  . Marital status: Married    Spouse name: Not on file  . Number of children: Not on file  . Years of education: Not on file  . Highest education level: Not on file  Occupational History  . Occupation: Part time Programme researcher, broadcasting/film/video  Social Needs  . Financial resource strain: Not on file  . Food insecurity    Worry: Not on file    Inability: Not on file  . Transportation needs    Medical: Not on file    Non-medical: Not on file  Tobacco Use  . Smoking status: Passive Smoke Exposure - Never Smoker  . Smokeless tobacco: Never Used  . 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 Alcohol use: Yes    Alcohol/week: 28.0 standard drinks    Types: 7 Glasses of wine, 21 Cans of beer per week    Comment: 06/30/2014 "2-6 beer2 or glasses of wine/day; probably 2/3 of these beers"  . Drug use: No  . Sexual activity: Not Currently  Lifestyle  . Physical activity    Days per week: Not on file    Minutes per session: Not on file  . Stress: Not on file  Relationships  . Social Herbalist on phone: Not on file    Gets together: Not on file    Attends religious service: Not on file    Active member of club or organization: Not on file    Attends meetings of clubs or organizations: Not on file    Relationship status: Not on file  . Intimate partner violence    Fear of current or ex partner: Not on file    Emotionally abused: Not on file    Physically abused: Not on file    Forced sexual activity: Not on file  Other Topics Concern  . Not on file  Social History Narrative  . Not on file     No Known Allergies  Review of Systems negative except from HPI and PMH  Physical Exam BP 122/62   Pulse 76   Ht 6\' 5"  (1.956 m)   Wt 216 lb 12.8 oz (98.3 kg)   SpO2 98%   BMI 25.71 kg/m  Well developed and nourished  in no acute distress HENT normal Neck supple with JVP-flat Clear Regular rate and rhythm, no murmurs or gallops Abd-soft with active BS No Clubbing cyanosis edema Skin-warm and dry A & Oriented  Grossly normal sensory and motor function But slightly  ECG sinus@ 61 15/16/48 Upriggt QRS lead V1 and rS  lead 1     Assessment and  Plan  Nonischemic cardiomyopathy  Congestive heart failure chronic  systolic  Ventricular tachycardia     Orthostatic lightheadedness    Implantable defibrillator-Medtronic  High risk medication surveillance  SVT  Patient has had interval SVT terminated by antitachycardia pacing.  After discussions with the patient, we have elected to maintain ATP for tachycardia is faster than 170 less than 200 but to turn off shocks in this zone.  Continues improved symptoms No interval echo No vt  Will discuss with DM consolidating visits to save cost  Check surveillance labs on sotalol  Euvolemic continue current meds  Orthostasis is much improved    We spent more than 50% of our >25 min visit in face to face counseling regarding the above

## 2018-08-14 NOTE — Patient Instructions (Signed)
Medication Instructions:  Your physician recommends that you continue on your current medications as directed. Please refer to the Current Medication list given to you today.  Labwork: You will have labs drawn today: BMP and Mg  Testing/Procedures: None ordered.  Follow-Up: Your physician recommends that you schedule a follow-up appointment in:   6 months with Dr. Caryl Comes  Any Other Special Instructions Will Be Listed Below (If Applicable).     If you need a refill on your cardiac medications before your next appointment, please call your pharmacy.

## 2018-08-15 LAB — CBC
Hematocrit: 41.1 % (ref 37.5–51.0)
Hemoglobin: 13.6 g/dL (ref 13.0–17.7)
MCH: 32.3 pg (ref 26.6–33.0)
MCHC: 33.1 g/dL (ref 31.5–35.7)
MCV: 98 fL — ABNORMAL HIGH (ref 79–97)
Platelets: 178 10*3/uL (ref 150–450)
RBC: 4.21 x10E6/uL (ref 4.14–5.80)
RDW: 12.3 % (ref 11.6–15.4)
WBC: 6.7 10*3/uL (ref 3.4–10.8)

## 2018-08-15 LAB — BASIC METABOLIC PANEL
BUN/Creatinine Ratio: 11 (ref 9–20)
BUN: 12 mg/dL (ref 6–24)
CO2: 23 mmol/L (ref 20–29)
Calcium: 9.5 mg/dL (ref 8.7–10.2)
Chloride: 102 mmol/L (ref 96–106)
Creatinine, Ser: 1.14 mg/dL (ref 0.76–1.27)
GFR calc Af Amer: 87 mL/min/{1.73_m2} (ref >59)
GFR calc non Af Amer: 75 mL/min/{1.73_m2} (ref >59)
Glucose: 93 mg/dL (ref 65–99)
Potassium: 4.7 mmol/L (ref 3.5–5.2)
Sodium: 140 mmol/L (ref 134–144)

## 2018-08-15 LAB — MAGNESIUM: Magnesium: 2.1 mg/dL (ref 1.6–2.3)

## 2018-08-20 ENCOUNTER — Encounter: Payer: Self-pay | Admitting: Psychiatry

## 2018-08-20 ENCOUNTER — Ambulatory Visit (INDEPENDENT_AMBULATORY_CARE_PROVIDER_SITE_OTHER): Payer: No Typology Code available for payment source | Admitting: Psychiatry

## 2018-08-20 ENCOUNTER — Other Ambulatory Visit: Payer: Self-pay

## 2018-08-20 VITALS — BP 110/69 | HR 72

## 2018-08-20 DIAGNOSIS — F900 Attention-deficit hyperactivity disorder, predominantly inattentive type: Secondary | ICD-10-CM

## 2018-08-20 MED ORDER — AMPHETAMINE-DEXTROAMPHETAMINE 10 MG PO TABS: ORAL_TABLET | ORAL | 0 refills | Status: DC

## 2018-08-20 MED ORDER — LISDEXAMFETAMINE DIMESYLATE 70 MG PO CAPS: 70.0000 mg | ORAL_CAPSULE | Freq: Every day | ORAL | 0 refills | Status: DC

## 2018-08-20 MED FILL — AMPHETAMINE-DEXTROAMPHETAMI: 10 | 30 days supply | Qty: 30 | Fill #0

## 2018-08-20 NOTE — Progress Notes (Signed)
[name] [id number] 1970-12-19 48 y.o.  Subjective:   Patient ID:  [name] is a 48 y.o. (DOB [birth year]) male.  Chief Complaint:  Chief Complaint  Patient presents with  . ADHD    Medication Management  . Follow-up    Medication management    HPI [name] presents to the office today for follow-up of ADD and history of shift work sleep disorder..  Last visit February 12, 2018.  No meds were changed.  Patient is under the care of cardiology.  Last seen August 14, 2018.  His diagnoses include nonischemic cardiomyopathy, systolic congestive heart failure which is chronic, history of ventricular tachycardia, history of SVT.  He has a implantable defibrillator and pacemaker.  Pacemaker helped.  His last ejection fraction was 27%.  Cardiology had no concerns regarding stimulant use.  Career change decision at the beginning of the year and then Covid hit.  Had to return to [employer].  Has built and made things during Covid and other projects.  Enjoys making things.  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  Been productive.  Daughter has stayed with him since Covid a lot.  48yo.    Satisfied with ADD treatment with Vyvanse and occ immediate use Adderall 10 mg on occasion.  Needs this esp on long days.  Had trouble with the holidays with schedule and what to do.  This has prompted him to consider another vocation.  Comfortable rut for 6 years.  Needs to change.  Med wise the schedule has been more erratic requiring short-acting stimulant.  Patient reports stable mood and denies depressed or irritable moods.  Patient denies any recent difficulty with anxiety.  Patient denies difficulty with sleep initiation or maintenance. Denies appetite disturbance.  Patient reports that energy and motivation have been good.  Patient denies any difficulty with concentration.  Patient denies any suicidal ideation.  Questions re: author's he read  on psychiatric illness.  Past Psychiatric Medication Trials: Vyvanse 70, Concerta, Adderall, Wellbutrin 300, Adderall XR, Daytrana, clonazepam Patient in this practice since 2004 treated for ADD  Review of Systems:  Review of Systems  Neurological: Negative for tremors and weakness.  Psychiatric/Behavioral: Negative for agitation, behavioral problems, confusion, decreased concentration, dysphoric mood, hallucinations, self-injury, sleep disturbance and suicidal ideas. The patient is not nervous/anxious and is not hyperactive.     Medications: I have reviewed the patient's current medications.  Current Outpatient Medications  Medication Sig Dispense Refill  . albuterol (PROVENTIL HFA;VENTOLIN HFA) 108 (90 BASE) MCG/ACT inhaler Inhale 2 puffs into the lungs 4 (four) times daily as needed for wheezing. 1 Inhaler 5  . amphetamine-dextroamphetamine (ADDERALL) 10 MG tablet Take 10 mg by mouth as needed.    Marland Kitchen aspirin 81 MG tablet Take 81 mg by mouth daily.      . carvedilol (COREG) 12.5 MG tablet TAKE 1 TABLET BY MOUTH 2 TIMES DAILY. PLEASE KEEP UPCOMING APPT IN APRIL FOR MORE REFILLS 60 tablet 0  . Coenzyme Q10 (CO Q 10 PO) Take 1 tablet by mouth daily.    . fish oil-omega-3 fatty acids 1000 MG capsule Take 1 g by mouth daily.     . Glucosamine-Chondroit-Vit C-Mn (GLUCOSAMINE 1500 COMPLEX PO) Take 1 tablet by mouth daily.      Marland Kitchen lisdexamfetamine (VYVANSE) 70 MG capsule Take 1 capsule (70 mg total) by mouth daily. 30 capsule 0  . Multiple Vitamin (MULTIVITAMIN WITH MINERALS) TABS tablet Take 1 tablet by mouth daily.    Marland Kitchen  sacubitril-valsartan (ENTRESTO) 24-26 MG Take 1 tablet by mouth 2 (two) times daily. 60 tablet 2  . sildenafil (VIAGRA) 50 MG tablet Take 1 tablet (50 mg total) by mouth daily as needed for erectile dysfunction. 10 tablet 2  . sotalol (BETAPACE) 80 MG tablet TAKE 1 TABLET BY MOUTH EVERY 12 HOURS. PLEASE KEEP UPCOMING APPT IN APRIL FOR MORE REFILLS 60 tablet 0  . spironolactone  (ALDACTONE) 25 MG tablet TAKE 1 TABLET BY MOUTH ONCE DAILY 30 tablet 6  . Turmeric 500 MG CAPS Take 1,500 mg by mouth daily.      No current facility-administered medications for this visit.     Medication Side Effects: None  Allergies: No Known Allergies  Past Medical History:  Diagnosis Date  . ADHD (attention deficit hyperactivity disorder)   . AICD (automatic cardioverter/defibrillator) present 06/30/2014   BiV ICD Insertion CRT-D  . Asthma   . ETOH abuse   . Heart murmur   . Nonischemic cardiomyopathy ([place])   . PAF (paroxysmal atrial fibrillation) ([place])   . Pneumonia ~ 01/2014  . Syncope    episode occured Feb 2011, ICD placed  . Systolic congestive heart failure ([place])    With ejection fraction of 25%, normal coronaries on Cath 02/2010  . 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([place]) 1998   "right"  . Ventricular tachycardia (HCC)     Family History  Problem Relation Age of Onset  . Heart disease Mother        mother and sister with valve problems  . Pancreatic cancer Maternal Grandfather   . Cardiomyopathy Neg Hx     Social History   Socioeconomic History  . Marital status: Married    Spouse name: Not on file  . Number of children: Not on file  . Years of education: Not on file  . Highest education level: Not on file  Occupational History  . Occupation: Part time Programme researcher, broadcasting/film/video  Social Needs  . Financial resource strain: Not on file  . Food insecurity    Worry: Not on file    Inability: Not on file  . Transportation needs    Medical: Not on file    Non-medical: Not on file  Tobacco Use  . Smoking status: Passive Smoke Exposure - Never Smoker  . Smokeless tobacco: Never Used  . 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 Alcohol use: Yes    Alcohol/week: 28.0 standard drinks    Types: 7 Glasses of wine, 21 Cans of beer per week    Comment: 06/30/2014 "2-6 beer2 or glasses of wine/day; probably 2/3  of these beers"  . Drug use: No  . Sexual activity: Not Currently  Lifestyle  . Physical activity    Days per week: Not on file    Minutes per session: Not on file  . Stress: Not on file  Relationships  . Social Herbalist on phone: Not on file    Gets together: Not on file    Attends religious service: Not on file    Active member of club or organization: Not on file    Attends meetings of clubs or organizations: Not on file    Relationship status: Not on file  . Intimate partner violence    Fear of current or ex partner: Not on file    Emotionally abused: Not on file    Physically abused: Not on file  Forced sexual activity: Not on file  Other Topics Concern  . Not on file  Social History Narrative  . Not on file    Past Medical History, Surgical history, Social history, and Family history were reviewed and updated as appropriate.   Please see review of systems for further details on the patient's review from today.   Objective:   Physical Exam:  BP 110/69   Pulse 72   Physical Exam Constitutional:      General: He is not in acute distress.    Appearance: He is well-developed.  Musculoskeletal:        General: No deformity.  Neurological:     Mental Status: He is alert and oriented to person, place, and time.     Motor: No tremor.     Coordination: Coordination normal.     Gait: Gait normal.  Psychiatric:        Attention and Perception: Attention normal. He is attentive. He does not perceive auditory hallucinations.        Mood and Affect: Mood normal. Mood is not anxious or depressed. Affect is not labile, blunt, angry or inappropriate.        Speech: Speech normal.        Behavior: Behavior normal.        Thought Content: Thought content normal. Thought content does not include homicidal or suicidal ideation. Thought content does not include homicidal or suicidal plan.        Cognition and Memory: Cognition normal.        Judgment: Judgment  normal.     Comments: Insight is good.     Lab Review:     Component Value Date/Time   NA 140 08/14/2018 1521   K 4.7 08/14/2018 1521   CL 102 08/14/2018 1521   CO2 23 08/14/2018 1521   GLUCOSE 93 08/14/2018 1521   GLUCOSE 90 03/02/2018 1127   BUN 12 08/14/2018 1521   CREATININE 1.14 08/14/2018 1521   CREATININE 1.17 07/08/2015 0835   CALCIUM 9.5 08/14/2018 1521   GFRNONAA 75 08/14/2018 1521   GFRAA 87 08/14/2018 1521       Component Value Date/Time   WBC 6.7 08/14/2018 1521   WBC 7.6 11/25/2015 0102   RBC 4.21 08/14/2018 1521   RBC 4.34 11/25/2015 0102   HGB 13.6 08/14/2018 1521   HCT 41.1 08/14/2018 1521   PLT 178 08/14/2018 1521   MCV 98 (H) 08/14/2018 1521   MCH 32.3 08/14/2018 1521   MCH 32.9 11/25/2015 0102   MCHC 33.1 08/14/2018 1521   MCHC 33.8 11/25/2015 0102   RDW 12.3 08/14/2018 1521   LYMPHSABS 1.2 06/23/2014 1139   MONOABS 0.4 06/23/2014 1139   EOSABS 0.4 06/23/2014 1139   BASOSABS 0.0 06/23/2014 1139    No results found for: POCLITH, LITHIUM   No results found for: PHENYTOIN, PHENOBARB, VALPROATE, CBMZ   .res Assessment: Plan:    [name] was seen today for adhd and follow-up.  Diagnoses and all orders for this visit:  Attention deficit hyperactivity disorder (ADHD), predominantly inattentive type -     lisdexamfetamine (VYVANSE) 70 MG capsule; Take 1 capsule (70 mg total) by mouth daily. -     lisdexamfetamine (VYVANSE) 70 MG capsule; Take 1 capsule (70 mg total) by mouth daily. -     lisdexamfetamine (VYVANSE) 70 MG capsule; Take 1 capsule (70 mg total) by mouth daily. -     amphetamine-dextroamphetamine (ADDERALL) 10 MG tablet; Each afternoon   Continue Vyvanse  70 mg AM and occ Adderall 10 mg each pm.  No change indicated.  But disc the alternative types of stimulants and durations and SE. Discussed potential benefits, risks, and side effects of stimulants with patient to include increased heart rate, palpitations, insomnia, increased  anxiety, increased irritability, or decreased appetite.  Instructed patient to contact office if experiencing any significant tolerability issues.  He's paying attention to the cardiac risk.  Supportive therapy on job seeking.  Working on this.  Has a plan.    FU 6 mos  Christopher Parents, MD, DFAPA   Please see After Visit Summary for patient specific instructions.  Future Appointments  Date Time Provider [place]  09/17/2018  7:20 AM [facility] DEVICE REMOTES [facility]  12/17/2018  7:20 AM [facility] DEVICE REMOTES [facility]  03/18/2019  7:20 AM [facility] DEVICE REMOTES [facility]  06/17/2019  7:20 AM [facility] DEVICE REMOTES [facility]  09/16/2019  7:20 AM [facility] DEVICE REMOTES [facility]  12/16/2019  7:20 AM [facility] DEVICE REMOTES [facility]    No orders of the defined types were placed in this encounter.     -------------------------------

## 2018-09-11 ENCOUNTER — Other Ambulatory Visit: Payer: Self-pay | Admitting: Internal Medicine

## 2018-09-11 MED ORDER — SOTALOL HCL 80 MG PO TABS: ORAL_TABLET | ORAL | 3 refills | Status: DC

## 2018-09-11 MED ORDER — CARVEDILOL 12.5 MG PO TABS: ORAL_TABLET | ORAL | 3 refills | Status: DC

## 2018-09-11 MED FILL — AMPHETAMINE-DEXTROAMPHETAMI: 10 | 30 days supply | Qty: 30 | Fill #0

## 2018-09-11 MED FILL — CARVEDILOL 12.5 MG TABLET: 12.5 | 90 days supply | Qty: 180 | Fill #0

## 2018-09-11 MED FILL — VYVANSE 70 MG CAPSULE: 70 | 30 days supply | Qty: 30 | Fill #0

## 2018-09-11 MED FILL — SOTALOL HCL 80 MG TAB: 80 | 90 days supply | Qty: 180 | Fill #0

## 2018-09-11 MED FILL — ENTRESTO 24 MG-26 MG TABLET: 24-26 | 30 days supply | Qty: 60 | Fill #1

## 2018-09-17 ENCOUNTER — Other Ambulatory Visit: Payer: Self-pay

## 2018-09-17 ENCOUNTER — Ambulatory Visit (INDEPENDENT_AMBULATORY_CARE_PROVIDER_SITE_OTHER): Payer: No Typology Code available for payment source | Admitting: *Deleted

## 2018-09-17 DIAGNOSIS — I4891 Unspecified atrial fibrillation: Secondary | ICD-10-CM

## 2018-09-17 DIAGNOSIS — I472 Ventricular tachycardia, unspecified: Secondary | ICD-10-CM

## 2018-09-18 LAB — CUP PACEART REMOTE DEVICE CHECK
Battery Remaining Longevity: 43 mo
Battery Voltage: 2.97 V
Brady Statistic AP VP Percent: 0.43 %
Brady Statistic AP VS Percent: 0.03 %
Brady Statistic AS VP Percent: 96.81 %
Brady Statistic AS VS Percent: 2.72 %
Brady Statistic RA Percent Paced: 0.46 %
Brady Statistic RV Percent Paced: 2.46 %
Date Time Interrogation Session: 20200914071704
HighPow Impedance: 46 Ohm
HighPow Impedance: 59 Ohm
Implantable Lead Implant Date: 20120208
Implantable Lead Implant Date: 20160627
Implantable Lead Implant Date: 20160627
Implantable Lead Location: 753858
Implantable Lead Location: 753860
Implantable Lead Location: 753860
Implantable Lead Model: 185
Implantable Lead Model: 4598
Implantable Lead Model: 5076
Implantable Lead Serial Number: 345409
Implantable Pulse Generator Implant Date: 20160627
Lead Channel Impedance Value: 361 Ohm
Lead Channel Impedance Value: 399 Ohm
Lead Channel Impedance Value: 399 Ohm
Lead Channel Impedance Value: 399 Ohm
Lead Channel Impedance Value: 418 Ohm
Lead Channel Impedance Value: 418 Ohm
Lead Channel Impedance Value: 513 Ohm
Lead Channel Impedance Value: 513 Ohm
Lead Channel Impedance Value: 665 Ohm
Lead Channel Impedance Value: 665 Ohm
Lead Channel Impedance Value: 760 Ohm
Lead Channel Impedance Value: 779 Ohm
Lead Channel Impedance Value: 779 Ohm
Lead Channel Pacing Threshold Amplitude: 0.5 V
Lead Channel Pacing Threshold Amplitude: 0.5 V
Lead Channel Pacing Threshold Amplitude: 1.875 V
Lead Channel Pacing Threshold Pulse Width: 0.4 ms
Lead Channel Pacing Threshold Pulse Width: 0.4 ms
Lead Channel Pacing Threshold Pulse Width: 0.4 ms
Lead Channel Sensing Intrinsic Amplitude: 0.625 mV
Lead Channel Sensing Intrinsic Amplitude: 0.625 mV
Lead Channel Sensing Intrinsic Amplitude: 12.75 mV
Lead Channel Sensing Intrinsic Amplitude: 12.75 mV
Lead Channel Setting Pacing Amplitude: 1 V
Lead Channel Setting Pacing Amplitude: 1.5 V
Lead Channel Setting Pacing Amplitude: 2 V
Lead Channel Setting Pacing Pulse Width: 0.4 ms
Lead Channel Setting Pacing Pulse Width: 0.8 ms
Lead Channel Setting Sensing Sensitivity: 0.45 mV

## 2018-09-28 NOTE — Progress Notes (Signed)
Remote ICD transmission.   

## 2018-10-11 MED FILL — ENTRESTO 24 MG-26 MG TABLET: 24-26 | 30 days supply | Qty: 60 | Fill #2

## 2018-10-11 MED FILL — VYVANSE 70 MG CAPSULE: 70 | 30 days supply | Qty: 30 | Fill #0

## 2018-11-09 ENCOUNTER — Other Ambulatory Visit (HOSPITAL_COMMUNITY): Payer: Self-pay | Admitting: Cardiology

## 2018-11-09 MED FILL — ENTRESTO 24 MG-26 MG TABLET: 24-26 | 30 days supply | Qty: 60 | Fill #0

## 2018-11-09 MED FILL — VYVANSE 70 MG CAPSULE: 70 | 30 days supply | Qty: 30 | Fill #0

## 2018-11-09 MED FILL — AMPHETAMINE-DEXTROAMPHETAMI: 10 | 30 days supply | Qty: 30 | Fill #0

## 2018-12-11 ENCOUNTER — Other Ambulatory Visit: Payer: Self-pay | Admitting: Psychiatry

## 2018-12-11 DIAGNOSIS — F900 Attention-deficit hyperactivity disorder, predominantly inattentive type: Secondary | ICD-10-CM

## 2018-12-11 MED FILL — ENTRESTO 24 MG-26 MG TABLET: 24-26 | 30 days supply | Qty: 60 | Fill #1

## 2018-12-11 MED FILL — CARVEDILOL 12.5 MG TABLET: 12.5 | 90 days supply | Qty: 180 | Fill #1

## 2018-12-11 MED FILL — SOTALOL HCL 80 MG TAB: 80 | 90 days supply | Qty: 180 | Fill #1

## 2018-12-11 NOTE — Telephone Encounter (Signed)
Due back Feb 2021

## 2018-12-12 MED FILL — VYVANSE 70 MG CAPSULE: 70 | 30 days supply | Qty: 30 | Fill #0

## 2019-01-09 ENCOUNTER — Other Ambulatory Visit: Payer: Self-pay | Admitting: Psychiatry

## 2019-01-09 DIAGNOSIS — F900 Attention-deficit hyperactivity disorder, predominantly inattentive type: Secondary | ICD-10-CM

## 2019-01-09 NOTE — Telephone Encounter (Signed)
Next apt 02/18/2019

## 2019-01-10 MED FILL — AMPHETAMINE-DEXTROAMPHETAMI: 10 | 30 days supply | Qty: 30 | Fill #0

## 2019-01-10 MED FILL — VYVANSE 70 MG CAPSULE: 70 | 30 days supply | Qty: 30 | Fill #0

## 2019-02-11 ENCOUNTER — Other Ambulatory Visit: Payer: Self-pay | Admitting: Psychiatry

## 2019-02-11 DIAGNOSIS — F900 Attention-deficit hyperactivity disorder, predominantly inattentive type: Secondary | ICD-10-CM

## 2019-02-12 MED FILL — VYVANSE 70 MG CAPSULE: 70 | 30 days supply | Qty: 30 | Fill #0

## 2019-02-12 NOTE — Telephone Encounter (Signed)
Apt 02/15

## 2019-02-18 ENCOUNTER — Encounter: Payer: Self-pay | Admitting: Psychiatry

## 2019-02-18 ENCOUNTER — Other Ambulatory Visit: Payer: Self-pay

## 2019-02-18 ENCOUNTER — Ambulatory Visit (INDEPENDENT_AMBULATORY_CARE_PROVIDER_SITE_OTHER): Payer: No Typology Code available for payment source | Admitting: Psychiatry

## 2019-02-18 VITALS — BP 116/67 | HR 72

## 2019-02-18 DIAGNOSIS — F900 Attention-deficit hyperactivity disorder, predominantly inattentive type: Secondary | ICD-10-CM

## 2019-02-18 DIAGNOSIS — G4726 Circadian rhythm sleep disorder, shift work type: Secondary | ICD-10-CM

## 2019-02-18 DIAGNOSIS — F325 Major depressive disorder, single episode, in full remission: Secondary | ICD-10-CM | POA: Diagnosis not present

## 2019-02-18 MED ORDER — AMPHETAMINE-DEXTROAMPHETAMINE 10 MG PO TABS: ORAL_TABLET | ORAL | 0 refills | Status: DC

## 2019-02-18 MED ORDER — LISDEXAMFETAMINE DIMESYLATE 70 MG PO CAPS: 70.0000 mg | ORAL_CAPSULE | Freq: Every day | ORAL | 0 refills | Status: DC

## 2019-02-18 MED ORDER — AMPHETAMINE-DEXTROAMPHETAMINE 10 MG PO TABS: 10.0000 mg | ORAL_TABLET | Freq: Every day | ORAL | 0 refills | Status: DC

## 2019-02-18 MED FILL — AMPHETAMINE-DEXTROAMPHETAMI: 10 | 30 days supply | Qty: 30 | Fill #0

## 2019-02-18 NOTE — Progress Notes (Signed)
BRANDONJAMES Barrett [id number] 30-Jul-1970 49 y.o.  Subjective:   Patient ID:  [name] is a 49 y.o. (DOB Nov 16, 1970) male.  Chief Complaint:  Chief Complaint  Patient presents with  . Follow-up    Medication Management  . ADHD    Medication Management    HPI [name] presents to the office today for follow-up of ADD and history of shift work sleep disorder..  Last visit August , 2020.  No meds were changed.Continue Vyvanse 70 mg AM and occ Adderall 10 mg each pm.  Still doing woodworking.  Redecorated his living room at home.     Patient is under the care of cardiology.  Last seen Sept, 2020.  His diagnoses include nonischemic cardiomyopathy, systolic congestive heart failure which is chronic, history of ventricular tachycardia, history of SVT.  He has a implantable defibrillator and pacemaker.  Pacemaker helped.  His last ejection fraction was 27%.  Cardiology had no concerns regarding stimulant use. Recent appt went well.  Career change decision at the beginning of the year and then Covid hit.  Had to return to [employer].  Has built and made things during Covid and other projects.  Enjoys making things.  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  Been productive.  Daughter has stayed with him since Covid a lot.  49yo.    Satisfied with ADD treatment with Vyvanse and occ immediate use Adderall 10 mg on occasion.  Needs this esp on long days.  Had trouble with the holidays with schedule and what to do.  This has prompted him to consider another vocation.  Comfortable rut for 6 years.  Needs to change.  Med wise the schedule has been more erratic requiring short-acting stimulant.  Setting goals helps his mood.  Covid is harder.  Patient reports stable mood and denies depressed or irritable moods.  Patient denies any recent difficulty with anxiety.  Patient denies difficulty with sleep initiation or maintenance and is better. Denies  appetite disturbance.  Patient reports that energy and motivation have been good.  Patient denies any difficulty with concentration.  Patient denies any suicidal ideation.  Questions re: author's he read on psychiatric illness.  Past Psychiatric Medication Trials: Vyvanse 70, Concerta, Adderall, Adderall XR, Daytrana,  Wellbutrin 300,  clonazepam Patient in this practice since 2004 treated for ADD  Review of Systems:  Review of Systems  Neurological: Negative for tremors and weakness.  Psychiatric/Behavioral: Negative for agitation, behavioral problems, confusion, decreased concentration, dysphoric mood, hallucinations, self-injury, sleep disturbance and suicidal ideas. The patient is not nervous/anxious and is not hyperactive.     Medications: I have reviewed the patient's current medications.  Current Outpatient Medications  Medication Sig Dispense Refill  . albuterol (PROVENTIL HFA;VENTOLIN HFA) 108 (90 BASE) MCG/ACT inhaler Inhale 2 puffs into the lungs 4 (four) times daily as needed for wheezing. 1 Inhaler 5  . [START ON 03/18/2019] amphetamine-dextroamphetamine (ADDERALL) 10 MG tablet Take 1 tablet (10 mg total) by mouth daily in the afternoon. 30 tablet 0  . amphetamine-dextroamphetamine (ADDERALL) 10 MG tablet TAKE 1 TABLET BY MOUTH EVERY AFTERNOON 30 tablet 0  . aspirin 81 MG tablet Take 81 mg by mouth daily.      . carvedilol (COREG) 12.5 MG tablet TAKE 1 TABLET BY MOUTH 2 TIMES DAILY. 180 tablet 3  . Coenzyme Q10 (CO Q 10 PO) Take 1 tablet by mouth daily.    Marland Kitchen ENTRESTO 24-26 MG TAKE 1 TABLET BY MOUTH  2 (TWO) TIMES DAILY. 60 tablet 2  . fish oil-omega-3 fatty acids 1000 MG capsule Take 1 g by mouth daily.     . Glucosamine-Chondroit-Vit C-Mn (GLUCOSAMINE 1500 COMPLEX PO) Take 1 tablet by mouth daily.      Marland Kitchen lisdexamfetamine (VYVANSE) 70 MG capsule Take 1 capsule (70 mg total) by mouth daily. 30 capsule 0  . [START ON 03/18/2019] lisdexamfetamine (VYVANSE) 70 MG capsule Take 1  capsule (70 mg total) by mouth daily. 30 capsule 0  . [START ON 04/15/2019] lisdexamfetamine (VYVANSE) 70 MG capsule Take 1 capsule (70 mg total) by mouth daily. 30 capsule 0  . Multiple Vitamin (MULTIVITAMIN WITH MINERALS) TABS tablet Take 1 tablet by mouth daily.    . sildenafil (VIAGRA) 50 MG tablet Take 1 tablet (50 mg total) by mouth daily as needed for erectile dysfunction. 10 tablet 2  . sotalol (BETAPACE) 80 MG tablet TAKE 1 TABLET BY MOUTH EVERY 12 HOURS. 180 tablet 3  . spironolactone (ALDACTONE) 25 MG tablet TAKE 1 TABLET BY MOUTH ONCE DAILY 30 tablet 6  . Turmeric 500 MG CAPS Take 1,500 mg by mouth daily.      No current facility-administered medications for this visit.    Medication Side Effects: None  Allergies: No Known Allergies  Past Medical History:  Diagnosis Date  . ADHD (attention deficit hyperactivity disorder)   . AICD (automatic cardioverter/defibrillator) present 06/30/2014   BiV ICD Insertion CRT-D  . Asthma   . ETOH abuse   . Heart murmur   . Nonischemic cardiomyopathy ([place])   . PAF (paroxysmal atrial fibrillation) ([place])   . Pneumonia ~ 01/2014  . Syncope    episode occured Feb 2011, ICD placed  . Systolic congestive heart failure ([place])    With ejection fraction of 25%, normal coronaries on Cath 02/2010  . 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([place]) 1998   "right"  . Ventricular tachycardia (HCC)     Family History  Problem Relation Age of Onset  . Heart disease Mother        mother and sister with valve problems  . Pancreatic cancer Maternal Grandfather   . Cardiomyopathy Neg Hx     Social History   Socioeconomic History  . Marital status: Married    Spouse name: Not on file  . Number of children: Not on file  . Years of education: Not on file  . Highest education level: Not on file  Occupational History  . Occupation: Part time Programme researcher, broadcasting/film/video  Tobacco Use  . Smoking status: Passive Smoke Exposure - Never Smoker  . Smokeless tobacco: Never Used  .  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 Alcohol use: Yes    Alcohol/week: 28.0 standard drinks    Types: 7 Glasses of wine, 21 Cans of beer per week    Comment: 06/30/2014 "2-6 beer2 or glasses of wine/day; probably 2/3 of these beers"  . Drug use: No  . Sexual activity: Not Currently  Other Topics Concern  . Not on file  Social History Narrative  . Not on file   Social Determinants of Health   Financial Resource Strain:   . Difficulty of Paying Living Expenses: Not on file  Food Insecurity:   . Worried About Charity fundraiser in the Last Year: Not on file  . Ran Out of Food in the Last Year: Not on file  Transportation Needs:   . Lack  of Transportation (Medical): Not on file  . Lack of Transportation (Non-Medical): Not on file  Physical Activity:   . Days of Exercise per Week: Not on file  . Minutes of Exercise per Session: Not on file  Stress:   . Feeling of Stress : Not on file  Social Connections:   . Frequency of Communication with Friends and Family: Not on file  . Frequency of Social Gatherings with Friends and Family: Not on file  . Attends Religious Services: Not on file  . Active Member of Clubs or Organizations: Not on file  . Attends Archivist Meetings: Not on file  . Marital Status: Not on file  Intimate Partner Violence:   . Fear of Current or Ex-Partner: Not on file  . Emotionally Abused: Not on file  . Physically Abused: Not on file  . Sexually Abused: Not on file    Past Medical History, Surgical history, Social history, and Family history were reviewed and updated as appropriate.   Please see review of systems for further details on the patient's review from today.   Objective:   Physical Exam:  BP 116/67   Pulse 72   Physical Exam Constitutional:      General: He is not in acute distress.    Appearance: He is well-developed.  Musculoskeletal:         General: No deformity.  Neurological:     Mental Status: He is alert and oriented to person, place, and time.     Motor: No tremor.     Coordination: Coordination normal.     Gait: Gait normal.  Psychiatric:        Attention and Perception: Attention normal. He is attentive. He does not perceive auditory hallucinations.        Mood and Affect: Mood normal. Mood is not anxious or depressed. Affect is not labile, blunt, angry or inappropriate.        Speech: Speech normal.        Behavior: Behavior normal.        Thought Content: Thought content normal. Thought content does not include homicidal or suicidal ideation. Thought content does not include homicidal or suicidal plan.        Cognition and Memory: Cognition normal.        Judgment: Judgment normal.     Comments: Insight is good.     Lab Review:     Component Value Date/Time   NA 140 08/14/2018 1521   K 4.7 08/14/2018 1521   CL 102 08/14/2018 1521   CO2 23 08/14/2018 1521   GLUCOSE 93 08/14/2018 1521   GLUCOSE 90 03/02/2018 1127   BUN 12 08/14/2018 1521   CREATININE 1.14 08/14/2018 1521   CREATININE 1.17 07/08/2015 0835   CALCIUM 9.5 08/14/2018 1521   GFRNONAA 75 08/14/2018 1521   GFRAA 87 08/14/2018 1521       Component Value Date/Time   WBC 6.7 08/14/2018 1521   WBC 7.6 11/25/2015 0102   RBC 4.21 08/14/2018 1521   RBC 4.34 11/25/2015 0102   HGB 13.6 08/14/2018 1521   HCT 41.1 08/14/2018 1521   PLT 178 08/14/2018 1521   MCV 98 (H) 08/14/2018 1521   MCH 32.3 08/14/2018 1521   MCH 32.9 11/25/2015 0102   MCHC 33.1 08/14/2018 1521   MCHC 33.8 11/25/2015 0102   RDW 12.3 08/14/2018 1521   LYMPHSABS 1.2 06/23/2014 1139   MONOABS 0.4 06/23/2014 1139   EOSABS 0.4 06/23/2014 1139   BASOSABS  0.0 06/23/2014 1139    No results found for: POCLITH, LITHIUM   No results found for: PHENYTOIN, PHENOBARB, VALPROATE, CBMZ   .res Assessment: Plan:    Zacharay was seen today for follow-up and adhd.  Diagnoses and all  orders for this visit:  Attention deficit hyperactivity disorder (ADHD), predominantly inattentive type -     lisdexamfetamine (VYVANSE) 70 MG capsule; Take 1 capsule (70 mg total) by mouth daily. -     lisdexamfetamine (VYVANSE) 70 MG capsule; Take 1 capsule (70 mg total) by mouth daily. -     lisdexamfetamine (VYVANSE) 70 MG capsule; Take 1 capsule (70 mg total) by mouth daily. -     amphetamine-dextroamphetamine (ADDERALL) 10 MG tablet; Take 1 tablet (10 mg total) by mouth daily in the afternoon. -     amphetamine-dextroamphetamine (ADDERALL) 10 MG tablet; TAKE 1 TABLET BY MOUTH EVERY AFTERNOON  Shift work sleep disorder  Major depressive disorder with single episode, in full remission (HCC)   Continue Vyvanse 70 mg AM and occ Adderall 10 mg each pm.  No change indicated.  But disc the alternative types of stimulants and durations and SE. Discussed potential benefits, risks, and side effects of stimulants with patient to include increased heart rate, palpitations, insomnia, increased anxiety, increased irritability, or decreased appetite.  Instructed patient to contact office if experiencing any significant tolerability issues.  He's paying attention to the cardiac risk.  Sleep is better and using techniques help.  Wrestled with insomnia all his life. Sleep hygiene and ideal temps.  Good sleep tends to cycle.  Supportive therapy on job seeking.  Working on this.  Has a plan.    FU 6 mos  Lynder Parents, MD, DFAPA   Please see After Visit Summary for patient specific instructions.  Future Appointments  Date Time Provider [place]  03/18/2019  7:20 AM [facility] DEVICE REMOTES [facility]  06/17/2019  7:20 AM [facility] DEVICE REMOTES [facility]  09/16/2019  7:20 AM [facility] DEVICE REMOTES [facility]  12/16/2019  7:20 AM [facility] DEVICE REMOTES [facility]    No orders of the defined types were placed in this  encounter.     -------------------------------

## 2019-02-22 MED FILL — ENTRESTO 24 MG-26 MG TABLET: 24-26 | 30 days supply | Qty: 60 | Fill #2

## 2019-03-14 ENCOUNTER — Telehealth: Payer: Self-pay

## 2019-03-14 MED FILL — VYVANSE 70 MG CAPSULE: 70 | 30 days supply | Qty: 30 | Fill #0

## 2019-03-14 NOTE — Telephone Encounter (Signed)
Prior authorization submitted for VYVANSE 70 MG through [employer] ID# [id number], approved effective 03/14/2019-03/13/2020. PA# [id number]

## 2019-03-20 ENCOUNTER — Telehealth: Payer: Self-pay

## 2019-03-20 NOTE — Telephone Encounter (Signed)
Unable to speak  with patient to remind of missed remote transmission 

## 2019-03-29 ENCOUNTER — Other Ambulatory Visit (HOSPITAL_COMMUNITY): Payer: Self-pay | Admitting: Cardiology

## 2019-03-29 MED FILL — CARVEDILOL 12.5 MG TABLET: 12.5 | 90 days supply | Qty: 180 | Fill #2

## 2019-03-29 MED FILL — SOTALOL HCL 80 MG TAB: 80 | 90 days supply | Qty: 180 | Fill #2

## 2019-04-02 MED FILL — ENTRESTO 24 MG-26 MG TABLET: 24-26 | 30 days supply | Qty: 60 | Fill #0

## 2019-04-02 MED FILL — SILDENAFIL CITRATE 50 MG TA: 50 | 30 days supply | Qty: 6 | Fill #0

## 2019-04-02 NOTE — Telephone Encounter (Signed)
This is a CHF pt, Dr. Mclean 

## 2019-04-11 MED FILL — VYVANSE 70 MG CAPSULE: 70 | 30 days supply | Qty: 30 | Fill #0

## 2019-05-01 ENCOUNTER — Ambulatory Visit: Payer: Self-pay | Admitting: Registered Nurse

## 2019-05-06 ENCOUNTER — Encounter: Payer: Self-pay | Admitting: Registered Nurse

## 2019-05-10 MED FILL — VYVANSE 70 MG CAPSULE: 70 | 30 days supply | Qty: 30 | Fill #0

## 2019-05-29 MED FILL — ENTRESTO 24 MG-26 MG TABLET: 24-26 | 30 days supply | Qty: 60 | Fill #1

## 2019-05-30 ENCOUNTER — Encounter: Payer: Self-pay | Admitting: Family Medicine

## 2019-05-30 ENCOUNTER — Other Ambulatory Visit: Payer: Self-pay

## 2019-05-30 ENCOUNTER — Ambulatory Visit (INDEPENDENT_AMBULATORY_CARE_PROVIDER_SITE_OTHER): Payer: Commercial Managed Care - PPO | Admitting: Family Medicine

## 2019-05-30 VITALS — BP 119/75 | HR 72 | Ht 77.0 in | Wt 212.0 lb

## 2019-05-30 DIAGNOSIS — G5622 Lesion of ulnar nerve, left upper limb: Secondary | ICD-10-CM | POA: Diagnosis not present

## 2019-05-30 MED ORDER — PREDNISONE 5 MG PO TABS: ORAL_TABLET | ORAL | 0 refills | Status: DC

## 2019-05-30 MED FILL — predniSONE 5 MG (21) TBPK: 5 | 6 days supply | Qty: 21 | Fill #0

## 2019-05-30 NOTE — Progress Notes (Signed)
[name] - 49 y.o. male MRN [id number]  Date of birth: [birth year]  SUBJECTIVE:  Including CC & ROS.  Chief Complaint  Patient presents with  . Elbow Pain    left     [name] is a 49 y.o. male that is presenting with left elbow and arm and hand pain.  The pain is been ongoing for a few months.  He works as a Restaurant manager, fast food.  He is right-handed.  No trauma or injury.  Symptoms do not seem to radiate proximally to the shoulder.  Symptoms seem to be staying the same.  They are intermittent in nature.     Review of Systems See HPI   HISTORY: Past Medical, Surgical, Social, and Family History Reviewed & Updated per EMR.   Pertinent Historical Findings include:  Past Medical History:  Diagnosis Date  . ADHD (attention deficit hyperactivity disorder)   . AICD (automatic cardioverter/defibrillator) present 06/30/2014   BiV ICD Insertion CRT-D  . Asthma   . ETOH abuse   . Heart murmur   . Nonischemic cardiomyopathy ([place])   . PAF (paroxysmal atrial fibrillation) ([place])   . Pneumonia ~ 01/2014  . Syncope    episode occured Feb 2011, ICD placed  . Systolic congestive heart failure ([place])    With ejection fraction of 25%, normal coronaries on Cath 02/2010  . 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([place]) 1998   "right"  . Ventricular tachycardia [facility])     Past Surgical History:  Procedure Laterality Date  . CARDIAC DEFIBRILLATOR PLACEMENT  2014   "single lead; Medtronic"  . EP IMPLANTABLE DEVICE N/A 06/30/2014   Procedure: BiV ICD Insertion CRT-D;  Surgeon: Deboraha Sprang, MD;  Location: [place] [facility];  Service: Cardiovascular;  Laterality: N/A;  . FRACTURE SURGERY Left ~ 1988  . ORCHIECTOMY Right 1998   with abdominal lymph node dissection  . WRIST FRACTURE SURGERY      Family History  Problem Relation Age of Onset  . Heart disease Mother        mother and sister with valve problems  . Pancreatic cancer Maternal Grandfather   . Cardiomyopathy Neg Hx      Social History   Socioeconomic History  . Marital status: Married    Spouse name: Not on file  . Number of children: Not on file  . Years of education: Not on file  . Highest education level: Not on file  Occupational History  . Occupation: Part time Programme researcher, broadcasting/film/video  Tobacco Use  . Smoking status: Passive Smoke Exposure - Never Smoker  . Smokeless tobacco: Never Used  . 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 Alcohol use: Yes    Alcohol/week: 28.0 standard drinks    Types: 7 Glasses of wine, 21 Cans of beer per week    Comment: 06/30/2014 "2-6 beer2 or glasses of wine/day; probably 2/3 of these beers"  . Drug use: No  . Sexual activity: Not Currently  Other Topics Concern  . Not on file  Social History Narrative  . Not on file   Social Determinants of Health   Financial Resource Strain:   . Difficulty of Paying Living Expenses:   Food Insecurity:   . Worried About Charity fundraiser in the Last Year:   . Arboriculturist in the Last Year:   Transportation Needs:   . Film/video editor (Medical):   Marland Kitchen  Lack of Transportation (Non-Medical):   Physical Activity:   . Days of Exercise per Week:   . Minutes of Exercise per Session:   Stress:   . Feeling of Stress :   Social Connections:   . Frequency of Communication with Friends and Family:   . Frequency of Social Gatherings with Friends and Family:   . Attends Religious Services:   . Active Member of Clubs or Organizations:   . Attends Archivist Meetings:   Marland Kitchen Marital Status:   Intimate Partner Violence:   . Fear of Current or Ex-Partner:   . Emotionally Abused:   Marland Kitchen Physically Abused:   . Sexually Abused:      PHYSICAL EXAM:  VS: BP 119/75   Pulse 72   Ht 6\' 5"  (1.956 m)   Wt 212 lb (96.2 kg)   BMI 25.14 kg/m  Physical Exam Gen: NAD, alert, cooperative with exam, well-appearing MSK:  Left arm: No signs of  atrophy in the hand. Elbow flexion extension. No translation appreciated of the ulnar nerve at the cubital tunnel. Normal grip strength. Neurovascularly intact     ASSESSMENT & PLAN:   Ulnar neuropathy of left upper extremity Symptoms seem more related to the ulnar nerve at the elbow.  He does feel weakness in his grip and pain down through the forearm. -Counseled on home exercise therapy and supportive care. -Prednisone. -Counseled on compression. -Could consider injection versus nerve study

## 2019-05-30 NOTE — Assessment & Plan Note (Addendum)
Symptoms seem more related to the ulnar nerve at the elbow.  He does feel weakness in his grip and pain down through the forearm. -Counseled on home exercise therapy and supportive care. -Prednisone. -Counseled on compression. -Could consider injection versus nerve study

## 2019-06-04 MED FILL — SILDENAFIL CITRATE 50 MG TA: 50 | 30 days supply | Qty: 6 | Fill #1

## 2019-06-05 DIAGNOSIS — M779 Enthesopathy, unspecified: Secondary | ICD-10-CM

## 2019-06-09 NOTE — Progress Notes (Deleted)
Cardiology Office Note Date:  06/09/2019  Patient ID:  [name], DOB 09-Aug-1970, MRN [id number] PCP:  Patient, No Pcp Per  Electrophysiologist: Dr. Caryl Comes AHF: Dr. Aundra Dubin   Chief Complaint:  ***  History of Present Illness: [name] is a 49 y.o. male with history of NICM, presumed secondary to triple chemotherapy (testicular cancer), VT with ICD,  He also carries hx of ETOH abuse, currently mod ETOH intake, ADHD, asthma, and CHF, Raynaud's (reprted worse with BB tx).    May 2016, hospital admit for PAFib, single event of AF had been noted appears at that time he was intolerant of amiodarone with side effects, and was started on Sotalol for his VT and AF, no chronic a/c was started.   He comes today to be seen for Dr. Caryl Comes, last seen by him  Aug 2020, was doing well at that time, noted that he had ATP for an SVT,  He elected to maintain ATP for tachycardia is faster than 170 less than 200 but to turn off shocks in this zone.  Planned for sotalol labs.  *** symptoms *** volume *** SVT? VT, therapies? *** CM, meds, [place] *** sotalol ekg, labs *** AFib burden, not on a/c, chads is one   DEVICE information MDT CRT-D  (initial device, RA/RV leads 2012) > CRT upgrade 06/30/2014 ICD shock 8/15 ATP failed to terminate VT, 24J shock failed and 35 joule shock was successful There was associated syncope   AAD Hx 2016 for af, Amiodarone, intolerant 2/2 fatigue, nausea, myalgias 2016 sotalol >>> current   Past Medical History:  Diagnosis Date  . ADHD (attention deficit hyperactivity disorder)   . AICD (automatic cardioverter/defibrillator) present 06/30/2014   BiV ICD Insertion CRT-D  . Asthma   . ETOH abuse   . Heart murmur   . Nonischemic cardiomyopathy ([place])   . PAF (paroxysmal atrial fibrillation) ([place])   . Pneumonia ~ 01/2014  . Syncope    episode occured Feb 2011, ICD placed  . Systolic congestive heart failure ([place])    With ejection fraction of 25%,  normal coronaries on Cath 02/2010  . 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([place]) 1998   "right"  . Ventricular tachycardia [facility])     Past Surgical History:  Procedure Laterality Date  . CARDIAC DEFIBRILLATOR PLACEMENT  2014   "single lead; Medtronic"  . EP IMPLANTABLE DEVICE N/A 06/30/2014   Procedure: BiV ICD Insertion CRT-D;  Surgeon: Deboraha Sprang, MD;  Location: [place] [facility];  Service: Cardiovascular;  Laterality: N/A;  . FRACTURE SURGERY Left ~ 1988  . ORCHIECTOMY Right 1998   with abdominal lymph node dissection  . WRIST FRACTURE SURGERY      Current Outpatient Medications  Medication Sig Dispense Refill  . albuterol (PROVENTIL HFA;VENTOLIN HFA) 108 (90 BASE) MCG/ACT inhaler Inhale 2 puffs into the lungs 4 (four) times daily as needed for wheezing. 1 Inhaler 5  . amphetamine-dextroamphetamine (ADDERALL) 10 MG tablet Take 1 tablet (10 mg total) by mouth daily in the afternoon. 30 tablet 0  . amphetamine-dextroamphetamine (ADDERALL) 10 MG tablet TAKE 1 TABLET BY MOUTH EVERY AFTERNOON 30 tablet 0  . aspirin 81 MG tablet Take 81 mg by mouth daily.      . carvedilol (COREG) 12.5 MG tablet TAKE 1 TABLET BY MOUTH 2 TIMES DAILY. 180 tablet 3  . Coenzyme Q10 (CO Q 10 PO) Take 1 tablet by mouth daily.    Marland Kitchen ENTRESTO 24-26 MG TAKE 1 TABLET BY MOUTH TWICE DAILY 60  tablet 2  . fish oil-omega-3 fatty acids 1000 MG capsule Take 1 g by mouth daily.     . Glucosamine-Chondroit-Vit C-Mn (GLUCOSAMINE 1500 COMPLEX PO) Take 1 tablet by mouth daily.      Marland Kitchen lisdexamfetamine (VYVANSE) 70 MG capsule Take 1 capsule (70 mg total) by mouth daily. 30 capsule 0  . lisdexamfetamine (VYVANSE) 70 MG capsule Take 1 capsule (70 mg total) by mouth daily. 30 capsule 0  . lisdexamfetamine (VYVANSE) 70 MG capsule Take 1 capsule (70 mg total) by mouth daily. 30 capsule 0  . Multiple Vitamin (MULTIVITAMIN WITH MINERALS) TABS tablet Take 1 tablet by mouth daily.    . predniSONE (DELTASONE) 5 MG tablet Take 6 pills for first day, 5  pills second day, 4 pills third day, 3 pills fourth day, 2 pills the fifth day, and 1 pill sixth day. 21 tablet 0  . sildenafil (VIAGRA) 50 MG tablet TAKE 1 TABLET BY MOUTH ONCE DAILY AS NEEDED FOR ERECTILE DYSFUNCTION 6 tablet 2  . sotalol (BETAPACE) 80 MG tablet TAKE 1 TABLET BY MOUTH EVERY 12 HOURS. 180 tablet 3  . spironolactone (ALDACTONE) 25 MG tablet TAKE 1 TABLET BY MOUTH ONCE DAILY 30 tablet 6  . Turmeric 500 MG CAPS Take 1,500 mg by mouth daily.      No current facility-administered medications for this visit.    Allergies:   Patient has no known allergies.   Social History:  The patient  reports that he is a non-smoker but has been exposed to tobacco smoke. He has never used smokeless tobacco. He reports current alcohol use of about 28.0 standard drinks of alcohol per week. He reports that he does not use drugs.   Family History:  The patient's family history includes Heart disease in his mother; Pancreatic cancer in his maternal grandfather.  ROS:  Please see the history of present illness.  All other systems are reviewed and otherwise negative.   PHYSICAL EXAM:  VS:  There were no vitals taken for this visit. BMI: There is no height or weight on file to calculate BMI. Well nourished, well developed, in no acute distress  HEENT: normocephalic, atraumatic  Neck: no JVD, carotid bruits or masses Cardiac: *** RRR; no significant murmurs, no rubs, or gallops Lungs:  *** CTA b/l, no wheezing, rhonchi or rales  Abd: soft, nontender MS: no deformity or atrophy Ext: *** no edema  Skin: warm and dry, no rash Neuro:  No gross deficits appreciated Psych: euthymic mood, full affect  *** ICD site is stable, no tethering or discomfort   EKG:  Done today and reviewed by myself shows ***  Device interrogation today and reviewed by myself:  ***   09/19/2016: TTE Study Conclusions  - Left ventricle: The cavity size was moderately dilated. There was  mild concentric hypertrophy.  Systolic function was mildly  reduced. The estimated ejection fraction was in the range of 45%  to 50%. Wall motion was normal; there were no regional wall  motion abnormalities. Doppler parameters are consistent with  abnormal left ventricular relaxation (grade 1 diastolic  dysfunction). There was no evidence of elevated ventricular  filling pressure by Doppler parameters.  - Aortic root: The aortic root was mildly dilated measuring 42 mm.  - Ascending aorta: The ascending aorta was normal in size.  - Mitral valve: There was mild regurgitation.  - Left atrium: The atrium was mildly dilated.  - Right ventricle: Pacer wire or catheter noted in right ventricle.  Systolic function  was normal.  - Right atrium: Pacer wire or catheter noted in right atrium.  - Tricuspid valve: There was mild regurgitation.  - Inferior vena cava: The vessel was normal in size.  - Pericardium, extracardiac: There was no pericardial effusion.     05/26/14: Echocardiogram Study Conclusions - Left ventricle: The cavity size was mildly dilated. Wall  thickness was increased in a pattern of mild LVH. The estimated  ejection fraction was 30%. Diffuse hypokinesis. Septal-lateral  dyssynchrony noted. Features are consistent with a pseudonormal  left ventricular filling pattern, with concomitant abnormal  relaxation and increased filling pressure (grade 2 diastolic  dysfunction). - Aortic valve: There was no stenosis. - Aorta: Mildly dilated aortic root. Aortic root dimension: 41 mm  (ED). - Mitral valve: There was trivial regurgitation. - Left atrium: The atrium was mildly dilated. - Right ventricle: The cavity size was normal. Pacer wire or  catheter noted in right ventricle. Systolic function was normal. - Right atrium: The atrium was mildly dilated. - Pulmonary arteries: No complete TR doppler jet so unable to  estimate PA systolic pressure. - Inferior vena cava: The vessel was normal  in size. The  respirophasic diameter changes were in the normal range (= 50%),  consistent with normal central venous pressure. Impressions: - Mildly dilated LV with mild LV hypertrophy. EF 30% with diffuse  hypokinesis and septal-lateral dyssynchrony. Normal RV size and  systolic function. No significant valvular abnormalities.    Recent Labs: 08/14/2018: BUN 12; Creatinine, Ser 1.14; Hemoglobin 13.6; Magnesium 2.1; Platelets 178; Potassium 4.7; Sodium 140  No results found for requested labs within last 8760 hours.   CrCl cannot be calculated (Patient's most recent lab result is older than the maximum 21 days allowed.).   Wt Readings from Last 3 Encounters:  05/30/19 212 lb (96.2 kg)  08/14/18 216 lb 12.8 oz (98.3 kg)  03/02/18 222 lb (100.7 kg)     Other studies reviewed: Additional studies/records reviewed today include: summarized above  DEVICE information:  MDT, CRT-D, implanted 06/30/14, Dr. Caryl Comes, original device 2012 secondary prevention/CM Aug 2015 appropriate shock for VT  ASSESSMENT AND PLAN:  1. NICM 2. VT  3. LBBB  4. CRT-D     *** device functioning normally     *** no shocks, no VT     *** Exam appears euvolemic, corvue is at beaseline     *** On BB/ARB, aldactone     *** QTc stable on Sotalol      2. AFib     CHA2Ds2Vasc is 1 with LV dysfunction     *** burden         Disposition: ***   Current medicines are reviewed at length with the patient today.  The patient did not have any concerns regarding medicines.  Haywood Lasso, PA-C 06/09/2019 7:51 PM     [address] [place] [address] [phone] (office)  [phone] (fax)

## 2019-06-10 ENCOUNTER — Other Ambulatory Visit: Payer: Self-pay | Admitting: Psychiatry

## 2019-06-10 DIAGNOSIS — F900 Attention-deficit hyperactivity disorder, predominantly inattentive type: Secondary | ICD-10-CM

## 2019-06-11 NOTE — Telephone Encounter (Signed)
Next apt 08/18

## 2019-06-12 ENCOUNTER — Encounter: Payer: No Typology Code available for payment source | Admitting: Physician Assistant

## 2019-07-03 MED FILL — ENTRESTO 24 MG-26 MG TABLET: 24-26 | 30 days supply | Qty: 60 | Fill #2

## 2019-07-10 ENCOUNTER — Other Ambulatory Visit: Payer: Self-pay | Admitting: Psychiatry

## 2019-07-10 DIAGNOSIS — F900 Attention-deficit hyperactivity disorder, predominantly inattentive type: Secondary | ICD-10-CM

## 2019-07-10 NOTE — Telephone Encounter (Signed)
Next apt 08/04/2019 with CC

## 2019-07-11 MED FILL — VYVANSE 70 MG CAPSULE: 70 | 30 days supply | Qty: 30 | Fill #0

## 2019-07-12 ENCOUNTER — Ambulatory Visit (INDEPENDENT_AMBULATORY_CARE_PROVIDER_SITE_OTHER): Payer: Commercial Managed Care - PPO | Admitting: Family Medicine

## 2019-07-12 ENCOUNTER — Encounter: Payer: Self-pay | Admitting: Family Medicine

## 2019-07-12 ENCOUNTER — Ambulatory Visit: Payer: Self-pay

## 2019-07-12 ENCOUNTER — Other Ambulatory Visit: Payer: Self-pay

## 2019-07-12 VITALS — BP 146/80 | HR 77 | Ht 77.0 in | Wt 212.0 lb

## 2019-07-12 DIAGNOSIS — M79675 Pain in left toe(s): Secondary | ICD-10-CM

## 2019-07-12 DIAGNOSIS — M10072 Idiopathic gout, left ankle and foot: Secondary | ICD-10-CM | POA: Diagnosis not present

## 2019-07-12 DIAGNOSIS — G5622 Lesion of ulnar nerve, left upper limb: Secondary | ICD-10-CM | POA: Diagnosis not present

## 2019-07-12 MED ORDER — PREDNISONE 20 MG PO TABS: ORAL_TABLET | ORAL | 0 refills | Status: DC

## 2019-07-12 MED FILL — predniSONE 20 MG TABS: 20 | 10 days supply | Qty: 18 | Fill #0

## 2019-07-12 NOTE — Assessment & Plan Note (Signed)
Symptoms are suggestive as well as ultrasound findings to point towards gout as a source -Counseled supportive care. -Prednisone. -Uric acid. -ANA.  Counseled lesions in the second MTP joint.

## 2019-07-12 NOTE — Progress Notes (Signed)
[name] - 49 y.o. male MRN [id number]  Date of birth: 1970/02/23  SUBJECTIVE:  Including CC & ROS.  Chief Complaint  Patient presents with  . Follow-up    left elbow    [name] is a 49 y.o. male that is presenting with acute left great toe pain and follow-up for his left arm pain.  Of this arm is doing well with no complaints.  His left great toe has been painful throughout a week.  Having swelling and redness.  It is painful with ambulation.  Has been wearing a splint to help with the pain.  No history of gout.  No inciting event or injury.  No history of similar pain.  Review of Systems See HPI   HISTORY: Past Medical, Surgical, Social, and Family History Reviewed & Updated per EMR.   Pertinent Historical Findings include:  Past Medical History:  Diagnosis Date  . ADHD (attention deficit hyperactivity disorder)   . AICD (automatic cardioverter/defibrillator) present 06/30/2014   BiV ICD Insertion CRT-D  . Asthma   . ETOH abuse   . Heart murmur   . Nonischemic cardiomyopathy ([place])   . PAF (paroxysmal atrial fibrillation) ([place])   . Pneumonia ~ 01/2014  . Syncope    episode occured Feb 2011, ICD placed  . Systolic congestive heart failure ([place])    With ejection fraction of 25%, normal coronaries on Cath 02/2010  . 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([place]) 1998   "right"  . Ventricular tachycardia [facility])     Past Surgical History:  Procedure Laterality Date  . CARDIAC DEFIBRILLATOR PLACEMENT  2014   "single lead; [employer]tronic"  . EP IMPLANTABLE DEVICE N/A 06/30/2014   Procedure: BiV ICD Insertion CRT-D;  Surgeon: Deboraha Sprang, MD;  Location: [place] [facility];  Service: Cardiovascular;  Laterality: N/A;  . FRACTURE SURGERY Left ~ 1988  . ORCHIECTOMY Right 1998   with abdominal lymph node dissection  . WRIST FRACTURE SURGERY      Family History  Problem Relation Age of Onset  . Heart disease Mother        mother and sister with valve problems  . Pancreatic cancer  Maternal Grandfather   . Cardiomyopathy Neg Hx     Social History   Socioeconomic History  . Marital status: Married    Spouse name: Not on file  . Number of children: Not on file  . Years of education: Not on file  . Highest education level: Not on file  Occupational History  . Occupation: Part time Programme researcher, broadcasting/film/video  Tobacco Use  . Smoking status: Passive Smoke Exposure - Never Smoker  . Smokeless tobacco: Never Used  . 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 Vaping Use: Never used  Substance and Sexual Activity  . Alcohol use: Yes    Alcohol/week: 28.0 standard drinks    Types: 7 Glasses of wine, 21 Cans of beer per week    Comment: 06/30/2014 "2-6 beer2 or glasses of wine/day; probably 2/3 of these beers"  . Drug use: No  . Sexual activity: Not Currently  Other Topics Concern  . Not on file  Social History Narrative  . Not on file   Social Determinants of Health   Financial Resource Strain:   . Difficulty of Paying Living Expenses:   Food Insecurity:   . Worried About Charity fundraiser in the Last Year:   . [employer]  of Food in the Last Year:   Transportation Needs:   . Film/video editor (Medical):   Marland Kitchen Lack of Transportation (Non-Medical):   Physical Activity:   . Days of Exercise per Week:   . Minutes of Exercise per Session:   Stress:   . Feeling of Stress :   Social Connections:   . Frequency of Communication with Friends and Family:   . Frequency of Social Gatherings with Friends and Family:   . Attends Religious Services:   . Active Member of Clubs or Organizations:   . Attends Archivist Meetings:   Marland Kitchen Marital Status:   Intimate Partner Violence:   . Fear of Current or Ex-Partner:   . Emotionally Abused:   Marland Kitchen Physically Abused:   . Sexually Abused:      PHYSICAL EXAM:  VS: BP (!) 146/80   Pulse 77   Ht 6\' 5"  (1.956 m)   Wt 212 lb (96.2 kg)   BMI 25.14 kg/m  Physical  Exam Gen: NAD, alert, cooperative with exam, well-appearing MSK:  Left great toe: Redness and swelling occurring at the first MTP joint. Tenderness to palpation. Limited flexion and extension. Neurovascularly intact  Limited ultrasound: Left foot:  Effusion noted to the first MTP joint.  Synovitis with hyperemia associated over this area.  Hyperechoic changes within the synovium to suggest a gouty origin.  Summary: Gouty exacerbation of the first MTP joint.  Ultrasound and interpretation by Clearance Coots, MD    ASSESSMENT & PLAN:   Acute idiopathic gout involving toe of left foot Symptoms are suggestive as well as ultrasound findings to point towards gout as a source -Counseled supportive care. -Prednisone. -Uric acid. -ANA.  Counseled lesions in the second MTP joint.  Ulnar neuropathy of left upper extremity Has been doing well since been treated.  Denies any weakness or pain. -Counseled home exercise therapy and supportive care. -Could consider physical therapy.

## 2019-07-12 NOTE — Patient Instructions (Signed)
Good to see you Please try the medicine  I will call with the results from today   Please send me a message in MyChart with any questions or updates.  Please see me back in 4 weeks or as needed if better.   --[email or web address] Please decrease the amount of soda and beer.

## 2019-07-12 NOTE — Assessment & Plan Note (Signed)
Has been doing well since been treated.  Denies any weakness or pain. -Counseled home exercise therapy and supportive care. -Could consider physical therapy.

## 2019-07-24 NOTE — Progress Notes (Signed)
Cardiology Office Note Date:  07/25/2019  Patient ID:  [name], DOB [birth year], MRN [id number] PCP:  Patient, No Pcp Per  Electrophysiologist: Dr. Caryl Comes   Chief Complaint: overdue visit  History of Present Illness: [name] is a 49 y.o. 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 Caryl Comes Sept 2016 who noted hx of symptomatic sustained ventricular tachycardia. Discussions were had concerning catheter ablation. It was elected to use sotalol  but that was never initiated.  He has had recurrent prolonged nonsustained ventricular tachycardia; his device has been programmed to minimize therapy for this.  He also carries hx of ETOH abuse, none for a couple years, ADHD, asthma, and CHF.    May 2016, hospital admit for PAFib, single event of AF had been noted appears at that time he was intolerant of amiodarone with side effects, and was started on Sotalol for his VT and AF, no chronic a/c was started.   He comes in today to be seen for Dr. Caryl Comes, last seen by him Aug 2020, he was doing well, reported good exercise tolerance.  Noted to have SVT terminated by antitachycardia pacing.  After discussions with the patient, we have elected to maintain ATP for tachycardia is faster than 170 less than 200 but to turn off shocks in this zone. There is mention of h/o orthostatic lightheadedness that was improved  He feels well. He has felt 2x that he had Afib, the most recent in Feb, he is immediately aware of his heart beat, is fast and uncomfortable feeling.  No CP, no dizziness, near syncope or syncope, not overtly SOB but makes him feel weak, poorly. No clear trigger, though perhaps after salty meal. Outside of that he feels like he is in a good place, no CP, SOB, DOE, feels like he has good exertional capacity NO near syncope or syncope Tolerating his medicines.   DEVICE HISTORY: DEVICE information:   MDT, CRT-D, upgrade  06/30/14, Dr. Caryl Comes, original device 2012 secondary prevention/CM Aug 2015 appropriate shock for VT ICD shock 8/15 ATP failed to terminate VT, 24J shock failed and 35 joule shock was successful There was associated syncope    Past Medical History:  Diagnosis Date  . ADHD (attention deficit hyperactivity disorder)   . AICD (automatic cardioverter/defibrillator) present 06/30/2014   BiV ICD Insertion CRT-D  . Asthma   . ETOH abuse   . Heart murmur   . Nonischemic cardiomyopathy ([place])   . PAF (paroxysmal atrial fibrillation) ([place])   . Pneumonia ~ 01/2014  . Syncope    episode occured Feb 2011, ICD placed  . Systolic congestive heart failure ([place])    With ejection fraction of 25%, normal coronaries on Cath 02/2010  . 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([place]) 1998   "right"  . Ventricular tachycardia [facility])     Past Surgical History:  Procedure Laterality Date  . CARDIAC DEFIBRILLATOR PLACEMENT  2014   "single lead; [employer]"  . EP IMPLANTABLE DEVICE N/A 06/30/2014   Procedure: BiV ICD Insertion CRT-D;  Surgeon: Deboraha Sprang, MD;  Location: [place];  Service: Cardiovascular;  Laterality: N/A;  . FRACTURE SURGERY Left ~ 1988  . ORCHIECTOMY Right 1998   with abdominal lymph node dissection  . WRIST FRACTURE SURGERY      Current Outpatient Medications  Medication Sig Dispense Refill  . albuterol (PROVENTIL HFA;VENTOLIN HFA) 108 (90 BASE) MCG/ACT inhaler Inhale 2 puffs into the lungs 4 (  four) times daily as needed for wheezing. 1 Inhaler 5  . amphetamine-dextroamphetamine (ADDERALL) 10 MG tablet Take 1 tablet (10 mg total) by mouth daily in the afternoon. 30 tablet 0  . aspirin 81 MG tablet Take 81 mg by mouth daily.      . carvedilol (COREG) 12.5 MG tablet TAKE 1 TABLET BY MOUTH 2 TIMES DAILY. 180 tablet 3  . Coenzyme Q10 (CO Q 10 PO) Take 1 tablet by mouth daily.    Marland Kitchen ENTRESTO 24-26 MG TAKE 1 TABLET BY MOUTH TWICE DAILY 60 tablet 2  . fish oil-omega-3 fatty acids 1000 MG capsule Take 1  g by mouth daily.     . Glucosamine-Chondroit-Vit C-Mn (GLUCOSAMINE 1500 COMPLEX PO) Take 1 tablet by mouth daily.      Marland Kitchen lisdexamfetamine (VYVANSE) 70 MG capsule Take 1 capsule (70 mg total) by mouth daily. 30 capsule 0  . MAGNESIUM OXIDE PO Take by mouth daily. Helps with sleep    . Multiple Vitamin (MULTIVITAMIN WITH MINERALS) TABS tablet Take 1 tablet by mouth daily.    . sildenafil (VIAGRA) 50 MG tablet TAKE 1 TABLET BY MOUTH ONCE DAILY AS NEEDED FOR ERECTILE DYSFUNCTION 6 tablet 2  . sotalol (BETAPACE) 80 MG tablet TAKE 1 TABLET BY MOUTH EVERY 12 HOURS. 180 tablet 3  . spironolactone (ALDACTONE) 25 MG tablet TAKE 1 TABLET BY MOUTH ONCE DAILY 30 tablet 6  . Turmeric 500 MG CAPS Take 1,500 mg by mouth daily.      No current facility-administered medications for this visit.    Allergies:   Patient has no known allergies.   Social History:  The patient  reports that he is a non-smoker but has been exposed to tobacco smoke. He has never used smokeless tobacco. He reports current alcohol use of about 28.0 standard drinks of alcohol per week. He reports that he does not use drugs.   Family History:  The patient's family history includes Heart disease in his mother; Pancreatic cancer in his maternal grandfather.  ROS:  Please see the history of present illness.  All other systems are reviewed and otherwise negative.   PHYSICAL EXAM:  VS:  BP 126/78   Pulse 72   Ht 6\' 5"  (1.956 m)   Wt (!) 210 lb (95.3 kg)   BMI 24.90 kg/m  BMI: Body mass index is 24.9 kg/m. Well nourished, well developed, in no acute distress  HEENT: normocephalic, atraumatic  Neck: no JVD, carotid bruits or masses Cardiac:  RRR; no significant murmurs, no rubs, or gallops Lungs:  CTA b/l, no wheezing, rhonchi or rales  Abd: soft, nontender MS: no deformity or atrophy Ext:  no edema  Skin: warm and dry, no rash Neuro:  No gross deficits appreciated Psych: euthymic mood, full affect  ICD site is stable, no  tethering or discomfort   EKG:  Done today and reviewed by myself shows  SR 72bpm, PR 129ms, QRS 144ms, QT 424ms/QTc 456ms QT stable, appears similr to prior  Device interrogation done today and reviewed by myself : Battery and lead measurements are good 02/19/19: AFib episode, avg V rate 119, lasted nearly 21 hours 2 treated "VT" episodes These are his known SVT and both sucessfully treated with ATP LV threshold 0.75V/0.34ms, LV output increased to 1.25V to give 1/2V margin  09/19/2016: TTE Study Conclusions  - Left ventricle: The cavity size was moderately dilated. There was  mild concentric hypertrophy. Systolic function was mildly  reduced. The estimated ejection fraction was in  the range of 45%  to 50%. Wall motion was normal; there were no regional wall  motion abnormalities. Doppler parameters are consistent with  abnormal left ventricular relaxation (grade 1 diastolic  dysfunction). There was no evidence of elevated ventricular  filling pressure by Doppler parameters.  - Aortic root: The aortic root was mildly dilated measuring 42 mm.  - Ascending aorta: The ascending aorta was normal in size.  - Mitral valve: There was mild regurgitation.  - Left atrium: The atrium was mildly dilated.  - Right ventricle: Pacer wire or catheter noted in right ventricle.  Systolic function was normal.  - Right atrium: Pacer wire or catheter noted in right atrium.  - Tricuspid valve: There was mild regurgitation.  - Inferior vena cava: The vessel was normal in size.  - Pericardium, extracardiac: There was no pericardial effusion.   Impressions:  - When compared to the prior study from 05/26/2014 LVEF has improved  from 30% to 45-50% with diffuse hypokinesis. LV remains  moderately dilated.    05/26/14: Echocardiogram Study Conclusions - Left ventricle: The cavity size was mildly dilated. Wall  thickness was increased in a pattern of mild LVH. The estimated   ejection fraction was 30%. Diffuse hypokinesis. Septal-lateral  dyssynchrony noted. Features are consistent with a pseudonormal  left ventricular filling pattern, with concomitant abnormal  relaxation and increased filling pressure (grade 2 diastolic  dysfunction). - Aortic valve: There was no stenosis. - Aorta: Mildly dilated aortic root. Aortic root dimension: 41 mm  (ED). - Mitral valve: There was trivial regurgitation. - Left atrium: The atrium was mildly dilated. - Right ventricle: The cavity size was normal. Pacer wire or  catheter noted in right ventricle. Systolic function was normal. - Right atrium: The atrium was mildly dilated. - Pulmonary arteries: No complete TR doppler jet so unable to  estimate PA systolic pressure. - Inferior vena cava: The vessel was normal in size. The  respirophasic diameter changes were in the normal range (= 50%),  consistent with normal central venous pressure. Impressions: - Mildly dilated LV with mild LV hypertrophy. EF 30% with diffuse  hypokinesis and septal-lateral dyssynchrony. Normal RV size and  systolic function. No significant valvular abnormalities.    Recent Labs: 08/14/2018: Hemoglobin 13.6; Platelets 178 07/25/2019: ALT 27; BUN 16; Creatinine, Ser 0.95; Magnesium 2.1; Potassium 4.5; Sodium 139  No results found for requested labs within last 8760 hours.   Estimated Creatinine Clearance: 117.2 mL/min (by C-G formula based on SCr of 0.95 mg/dL).   Wt Readings from Last 3 Encounters:  07/25/19 (!) 210 lb (95.3 kg)  07/12/19 212 lb (96.2 kg)  05/30/19 212 lb (96.2 kg)     Other studies reviewed: Additional studies/records reviewed today include: summarized above   ASSESSMENT AND PLAN:  1. ICD     Intact function, programmed as above     We have not gotten remotes since last fall, he will check his transmitter at home and f/u with the [facility] clinic if troubleshooting help is needed   2. NICM 3. Chronic CHF      Some improvement in his LVEF by echo in 2018, 45-50%     No symptoms or exam findings of volume OL     OptiVol looks great     On BB, Entresto, aldactone     chemistries today  4. VT     None noted     On sotalol, QTc stable     Labs today  5. SVT  As above, gets success with ATP in this zone (noted previously and programmed by Dr. Caryl Comes  6. PAFib     Nearly 21 hours in Feb     18 hours in Sep 2020 previously seen     CHA2DS2Vasc is one.     Discussed afib and mechanism of stroke, discussed his risk score and role of a/c      For now, with his score, will monitor burden via his device, no a/c for now, he is comfortable with this.     Update his echo, he will call to scheduled with some insurance concerns     I will send my note to Dr. Caryl Comes             Disposition: remotes Q 77mo, in clinic in 68mo given his stoalol   Current medicines are reviewed at length with the patient today.  The patient did not have any concerns regarding medicines.  Haywood Lasso, PA-C 07/25/2019 7:07 PM     [address] [place] [address] [phone] (office)  [phone] (fax)

## 2019-07-25 ENCOUNTER — Other Ambulatory Visit: Payer: Self-pay | Admitting: Family Medicine

## 2019-07-25 ENCOUNTER — Encounter: Payer: Self-pay | Admitting: Physician Assistant

## 2019-07-25 ENCOUNTER — Other Ambulatory Visit: Payer: Self-pay

## 2019-07-25 ENCOUNTER — Ambulatory Visit (INDEPENDENT_AMBULATORY_CARE_PROVIDER_SITE_OTHER): Payer: Commercial Managed Care - PPO | Admitting: Physician Assistant

## 2019-07-25 VITALS — BP 126/78 | HR 72 | Ht 77.0 in | Wt 210.0 lb

## 2019-07-25 DIAGNOSIS — I5022 Chronic systolic (congestive) heart failure: Secondary | ICD-10-CM | POA: Diagnosis not present

## 2019-07-25 DIAGNOSIS — I471 Supraventricular tachycardia: Secondary | ICD-10-CM | POA: Diagnosis not present

## 2019-07-25 DIAGNOSIS — I472 Ventricular tachycardia, unspecified: Secondary | ICD-10-CM

## 2019-07-25 DIAGNOSIS — I428 Other cardiomyopathies: Secondary | ICD-10-CM | POA: Diagnosis not present

## 2019-07-25 DIAGNOSIS — Z95810 Presence of automatic (implantable) cardiac defibrillator: Secondary | ICD-10-CM

## 2019-07-25 DIAGNOSIS — I4891 Unspecified atrial fibrillation: Secondary | ICD-10-CM | POA: Diagnosis not present

## 2019-07-25 DIAGNOSIS — I480 Paroxysmal atrial fibrillation: Secondary | ICD-10-CM | POA: Diagnosis not present

## 2019-07-25 LAB — COMPREHENSIVE METABOLIC PANEL
ALT: 27 IU/L (ref 0–44)
AST: 20 IU/L (ref 0–40)
Albumin/Globulin Ratio: 2.1 (ref 1.2–2.2)
Albumin: 4.4 g/dL (ref 4.0–5.0)
Alkaline Phosphatase: 41 IU/L — ABNORMAL LOW (ref 48–121)
BUN/Creatinine Ratio: 17 (ref 9–20)
BUN: 16 mg/dL (ref 6–24)
Bilirubin Total: 0.4 mg/dL (ref 0.0–1.2)
CO2: 25 mmol/L (ref 20–29)
Calcium: 9.2 mg/dL (ref 8.7–10.2)
Chloride: 100 mmol/L (ref 96–106)
Creatinine, Ser: 0.95 mg/dL (ref 0.76–1.27)
GFR calc Af Amer: 107 mL/min/{1.73_m2} (ref >59)
GFR calc non Af Amer: 93 mL/min/{1.73_m2} (ref >59)
Globulin, Total: 2.1 g/dL (ref 1.5–4.5)
Glucose: 108 mg/dL — ABNORMAL HIGH (ref 65–99)
Potassium: 4.5 mmol/L (ref 3.5–5.2)
Sodium: 139 mmol/L (ref 134–144)
Total Protein: 6.5 g/dL (ref 6.0–8.5)

## 2019-07-25 LAB — MAGNESIUM: Magnesium: 2.1 mg/dL (ref 1.6–2.3)

## 2019-07-25 LAB — URIC ACID: Uric Acid: 7.9 mg/dL (ref 3.8–8.4)

## 2019-07-25 NOTE — Patient Instructions (Addendum)
Medication Instructions:   Your physician recommends that you continue on your current medications as directed. Please refer to the Current Medication list given to you today.   *If you need a refill on your cardiac medications before your next appointment, please call your pharmacy*   Lab Work:  [facility] AND [place]   If you have labs (blood work) drawn today and your tests are completely normal, you will receive your results only by: Marland Kitchen MyChart Message (if you have MyChart) OR . A paper copy in the mail If you have any lab test that is abnormal or we need to change your treatment, we will call you to review the results.   Testing/Procedures: CALL WHEN YOUR  READY TO SCHEDULE  Your physician has requested that you have an echocardiogram. Echocardiography is a painless test that uses sound waves to create images of your heart. It provides your doctor with information about the size and shape of your heart and how well your heart's chambers and valves are working. This procedure takes approximately one hour. There are no restrictions for this procedure.   Follow-Up: At [facility], you and your health needs are our priority.  As part of our continuing mission to provide you with exceptional heart care, we have created designated Provider Care Teams.  These Care Teams include your primary Cardiologist (physician) and Advanced Practice Providers (APPs -  Physician Assistants and Nurse Practitioners) who all work together to provide you with the care you need, when you need it.  We recommend signing up for the patient portal called "MyChart".  Sign up information is provided on this After Visit Summary.  MyChart is used to connect with patients for Virtual Visits (Telemedicine).  Patients are able to view lab/test results, encounter notes, upcoming appointments, etc.  Non-urgent messages can be sent to your provider as well.   To learn more about what you can do with MyChart, go to  [email or web address].    Your next appointment:   3 month(s)  The format for your next appointment:   In Person  Provider:   You may see Dr. Caryl Comes  or one of the following Advanced Practice Providers on your designated Care Team:    Chanetta Marshall, NP  Tommye Standard, PA-C  Legrand Como "Oda [place], [place]    Other Instructions

## 2019-07-27 LAB — ANA,IFA RA DIAG PNL W/RFLX TIT/PATN
ANA Titer 1: NEGATIVE
Cyclic Citrullin Peptide Ab: 6 units (ref 0–19)
Rheumatoid fact SerPl-aCnc: 10.2 IU/mL (ref 0.0–13.9)

## 2019-07-30 ENCOUNTER — Telehealth: Payer: Self-pay | Admitting: Family Medicine

## 2019-07-30 NOTE — Telephone Encounter (Signed)
Left VM for patient. If she calls back please have her speak with a nurse/CMA and inform that his uric acid was elevated at 7.9.  We can have a conversation about starting allopurinol based on this.  His other lab work was normal..   If any questions then please take the best time and phone number to call and I will try to call him back.   Rosemarie Ax, MD [facility] 07/30/2019, 1:31 PM

## 2019-07-31 ENCOUNTER — Telehealth: Payer: Self-pay | Admitting: Family Medicine

## 2019-07-31 ENCOUNTER — Other Ambulatory Visit: Payer: Self-pay | Admitting: Family Medicine

## 2019-07-31 MED ORDER — ALLOPURINOL 100 MG PO TABS: 100.0000 mg | ORAL_TABLET | Freq: Every day | ORAL | 2 refills | Status: DC

## 2019-07-31 MED FILL — ALLOPURINOL 100 MG TABS: 100 | 30 days supply | Qty: 30 | Fill #0

## 2019-07-31 NOTE — Telephone Encounter (Signed)
Discussed allopurinol and sent the medication in.  Check uric acid in few months.  Rosemarie Ax, MD [facility] 07/31/2019, 3:35 PM

## 2019-08-08 ENCOUNTER — Other Ambulatory Visit (HOSPITAL_COMMUNITY): Payer: Self-pay | Admitting: Cardiology

## 2019-08-08 ENCOUNTER — Other Ambulatory Visit: Payer: Self-pay | Admitting: Physician Assistant

## 2019-08-08 ENCOUNTER — Other Ambulatory Visit: Payer: Self-pay | Admitting: Psychiatry

## 2019-08-08 DIAGNOSIS — F900 Attention-deficit hyperactivity disorder, predominantly inattentive type: Secondary | ICD-10-CM

## 2019-08-08 MED FILL — CARVEDILOL 12.5 MG TABLET: 12.5 | 90 days supply | Qty: 180 | Fill #3

## 2019-08-08 MED FILL — SILDENAFIL CITRATE 50 MG TA: 50 | 30 days supply | Qty: 6 | Fill #2

## 2019-08-08 MED FILL — SOTALOL HCL 80 MG TABS: 80 | 90 days supply | Qty: 180 | Fill #3

## 2019-08-09 ENCOUNTER — Other Ambulatory Visit: Payer: Self-pay | Admitting: Psychiatry

## 2019-08-09 MED FILL — VYVANSE 70 MG CAPSULE: 70 | 30 days supply | Qty: 30 | Fill #0

## 2019-08-09 MED FILL — ENTRESTO 24 MG-26 MG TABLET: 24-26 | 30 days supply | Qty: 60 | Fill #0

## 2019-08-09 NOTE — Telephone Encounter (Signed)
Please refuse you already sent

## 2019-08-09 NOTE — Telephone Encounter (Signed)
Next apt 08/18

## 2019-08-12 ENCOUNTER — Other Ambulatory Visit (HOSPITAL_COMMUNITY): Payer: Self-pay | Admitting: Cardiology

## 2019-08-12 NOTE — Telephone Encounter (Signed)
Can you refuse? This was already sent on 08/06

## 2019-08-21 ENCOUNTER — Ambulatory Visit (INDEPENDENT_AMBULATORY_CARE_PROVIDER_SITE_OTHER): Payer: No Typology Code available for payment source | Admitting: Psychiatry

## 2019-08-21 ENCOUNTER — Encounter: Payer: Self-pay | Admitting: Psychiatry

## 2019-08-21 ENCOUNTER — Other Ambulatory Visit: Payer: Self-pay

## 2019-08-21 DIAGNOSIS — F325 Major depressive disorder, single episode, in full remission: Secondary | ICD-10-CM | POA: Diagnosis not present

## 2019-08-21 DIAGNOSIS — G4726 Circadian rhythm sleep disorder, shift work type: Secondary | ICD-10-CM | POA: Diagnosis not present

## 2019-08-21 DIAGNOSIS — F900 Attention-deficit hyperactivity disorder, predominantly inattentive type: Secondary | ICD-10-CM | POA: Diagnosis not present

## 2019-08-21 MED ORDER — AMPHETAMINE-DEXTROAMPHETAMINE 10 MG PO TABS: 10.0000 mg | ORAL_TABLET | Freq: Every day | ORAL | 0 refills | Status: DC

## 2019-08-21 MED ORDER — LISDEXAMFETAMINE DIMESYLATE 70 MG PO CAPS: 70.0000 mg | ORAL_CAPSULE | Freq: Every day | ORAL | 0 refills | Status: DC

## 2019-08-21 MED FILL — AMPHETAMINE SALTS 10 MG: 10 | 30 days supply | Qty: 30 | Fill #0

## 2019-08-21 NOTE — Progress Notes (Signed)
[name] [id number] 12-15-1970 49 y.o.  Subjective:   Patient ID:  [name] is a 49 y.o. (DOB 1970/03/07) male.  Chief Complaint:  Chief Complaint  Patient presents with  . Follow-up  . ADHD  . Anxiety    HPI [name] presents to the office today for follow-up of ADD and history of shift work sleep disorder..  +visit 02/18/19:.  No meds were changed.Continue Vyvanse 70 mg AM and occ Adderall 10 mg each pm.  08/21/19 appt with the following noted: Stressful but overall good since here.  No card problems since here.   Still doing woodworking.  Redecorated his living room at home.    Still on Vyvanse 70 and prn Adderall.  Some split double shifts. No SE.  Patient is under the care of cardiology.  Last seen Sept, 2020.  His diagnoses include nonischemic cardiomyopathy, systolic congestive heart failure which is chronic, history of ventricular tachycardia, history of SVT.  He has a implantable defibrillator and pacemaker.  Pacemaker helped.  His last ejection fraction was 27%.  Cardiology had no concerns regarding stimulant use. Recent appt went well.  Career change decision at the beginning of the year and then Covid hit.  Had to return to [employer].  Has built and made things during Covid and other projects.  Enjoys making things.  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  Been productive.  Daughter has stayed with him since Covid a lot.  49yo.    Satisfied with ADD treatment with Vyvanse and occ immediate use Adderall 10 mg on occasion.  Needs this esp on long days.  Had trouble with the holidays with schedule and what to do.  This has prompted him to consider another vocation.  Comfortable rut for 6 years.  Needs to change.  Med wise the schedule has been more erratic requiring short-acting stimulant.  Setting goals helps his mood.  Covid is harder.  Patient reports stable mood and denies depressed or irritable moods.   Patient denies any recent difficulty with anxiety.  Patient denies difficulty with sleep initiation or maintenance and is better. Denies appetite disturbance.  Patient reports that energy and motivation have been good.  Patient denies any difficulty with concentration.  Patient denies any suicidal ideation.  Questions re: author's he read on psychiatric illness.  Past Psychiatric Medication Trials: Vyvanse 70, Concerta, Adderall, Adderall XR, Daytrana,  Wellbutrin 300,  clonazepam Patient in this practice since 2004 treated for ADD  Review of Systems:  Review of Systems  Cardiovascular: Negative for palpitations.  Neurological: Negative for tremors and weakness.  Psychiatric/Behavioral: Negative for agitation, behavioral problems, confusion, decreased concentration, dysphoric mood, hallucinations, self-injury, sleep disturbance and suicidal ideas. The patient is not nervous/anxious and is not hyperactive.     Medications: I have reviewed the patient's current medications.  Current Outpatient Medications  Medication Sig Dispense Refill  . albuterol (PROVENTIL HFA;VENTOLIN HFA) 108 (90 BASE) MCG/ACT inhaler Inhale 2 puffs into the lungs 4 (four) times daily as needed for wheezing. 1 Inhaler 5  . allopurinol (ZYLOPRIM) 100 MG tablet Take 1 tablet (100 mg total) by mouth daily. 30 tablet 2  . amphetamine-dextroamphetamine (ADDERALL) 10 MG tablet Take 1 tablet (10 mg total) by mouth daily in the afternoon. 30 tablet 0  . aspirin 81 MG tablet Take 81 mg by mouth daily.      . carvedilol (COREG) 12.5 MG tablet TAKE 1 TABLET BY MOUTH 2 TIMES DAILY. [place]  tablet 3  . Coenzyme Q10 (CO Q 10 PO) Take 1 tablet by mouth daily.    . fish oil-omega-3 fatty acids 1000 MG capsule Take 1 g by mouth daily.     . Glucosamine-Chondroit-Vit C-Mn (GLUCOSAMINE 1500 COMPLEX PO) Take 1 tablet by mouth daily.      Derrill Memo ON 10/16/2019] lisdexamfetamine (VYVANSE) 70 MG capsule Take 1 capsule (70 mg total) by mouth  daily. 30 capsule 0  . MAGNESIUM OXIDE PO Take by mouth daily. Helps with sleep    . Multiple Vitamin (MULTIVITAMIN WITH MINERALS) TABS tablet Take 1 tablet by mouth daily.    . sacubitril-valsartan (ENTRESTO) 24-26 MG Take 1 tablet by mouth 2 (two) times daily. Needs appt 60 tablet 2  . sildenafil (VIAGRA) 50 MG tablet TAKE 1 TABLET BY MOUTH ONCE DAILY AS NEEDED FOR ERECTILE DYSFUNCTION 6 tablet 2  . sotalol (BETAPACE) 80 MG tablet TAKE 1 TABLET BY MOUTH EVERY 12 HOURS. 180 tablet 3  . spironolactone (ALDACTONE) 25 MG tablet TAKE 1 TABLET BY MOUTH ONCE DAILY 30 tablet 6  . Turmeric 500 MG CAPS Take 1,500 mg by mouth daily.     Derrill Memo ON 09/18/2019] amphetamine-dextroamphetamine (ADDERALL) 10 MG tablet Take 1 tablet (10 mg total) by mouth daily in the afternoon. 30 tablet 0  . [START ON 10/16/2019] amphetamine-dextroamphetamine (ADDERALL) 10 MG tablet Take 1 tablet (10 mg total) by mouth daily in the afternoon. 30 tablet 0  . [START ON 09/18/2019] lisdexamfetamine (VYVANSE) 70 MG capsule Take 1 capsule (70 mg total) by mouth daily. 30 capsule 0  . lisdexamfetamine (VYVANSE) 70 MG capsule Take 1 capsule (70 mg total) by mouth daily. 30 capsule 0   No current facility-administered medications for this visit.    Medication Side Effects: None  Allergies: No Known Allergies  Past Medical History:  Diagnosis Date  . ADHD (attention deficit hyperactivity disorder)   . AICD (automatic cardioverter/defibrillator) present 06/30/2014   BiV ICD Insertion CRT-D  . Asthma   . ETOH abuse   . Heart murmur   . Nonischemic cardiomyopathy ([place])   . PAF (paroxysmal atrial fibrillation) ([place])   . Pneumonia ~ 01/2014  . Syncope    episode occured Feb 2011, ICD placed  . Systolic congestive heart failure ([place])    With ejection fraction of 25%, normal coronaries on Cath 02/2010  . 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([place]) 1998   "right"  . Ventricular tachycardia ([facility])     Family History  Problem Relation Age of Onset   . Heart disease Mother        mother and sister with valve problems  . Pancreatic cancer Maternal Grandfather   . Cardiomyopathy Neg Hx     Social History   Socioeconomic History  . Marital status: Married    Spouse name: Not on file  . Number of children: Not on file  . Years of education: Not on file  . Highest education level: Not on file  Occupational History  . Occupation: Part time Programme researcher, broadcasting/film/video  Tobacco Use  . Smoking status: Passive Smoke Exposure - Never Smoker  . Smokeless tobacco: Never Used  . 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 Vaping Use: Never used  Substance and Sexual Activity  . Alcohol use: Yes    Alcohol/week: 28.0 standard drinks    Types: 7 Glasses of wine, 21 Cans of beer per week    Comment:  06/30/2014 "2-6 beer2 or glasses of wine/day; probably 2/3 of these beers"  . Drug use: No  . Sexual activity: Not Currently  Other Topics Concern  . Not on file  Social History Narrative  . Not on file   Social Determinants of Health   Financial Resource Strain:   . Difficulty of Paying Living Expenses:   Food Insecurity:   . Worried About Charity fundraiser in the Last Year:   . Arboriculturist in the Last Year:   Transportation Needs:   . Film/video editor (Medical):   Marland Kitchen Lack of Transportation (Non-Medical):   Physical Activity:   . Days of Exercise per Week:   . Minutes of Exercise per Session:   Stress:   . Feeling of Stress :   Social Connections:   . Frequency of Communication with Friends and Family:   . Frequency of Social Gatherings with Friends and Family:   . Attends Religious Services:   . Active Member of Clubs or Organizations:   . Attends Archivist Meetings:   Marland Kitchen Marital Status:   Intimate Partner Violence:   . Fear of Current or Ex-Partner:   . Emotionally Abused:   Marland Kitchen Physically Abused:   . Sexually Abused:     Past Medical History, Surgical  history, Social history, and Family history were reviewed and updated as appropriate.   Please see review of systems for further details on the patient's review from today.   Objective:   Physical Exam:  There were no vitals taken for this visit.  Physical Exam Constitutional:      General: He is not in acute distress.    Appearance: He is well-developed.  Musculoskeletal:        General: No deformity.  Neurological:     Mental Status: He is alert and oriented to person, place, and time.     Motor: No tremor.     Coordination: Coordination normal.     Gait: Gait normal.  Psychiatric:        Attention and Perception: Attention normal. He is attentive. He does not perceive auditory hallucinations.        Mood and Affect: Mood normal. Mood is not anxious or depressed. Affect is not labile, blunt, angry or inappropriate.        Speech: Speech normal. Speech is not slurred.        Behavior: Behavior normal.        Thought Content: Thought content normal. Thought content does not include homicidal or suicidal ideation. Thought content does not include homicidal or suicidal plan.        Cognition and Memory: Cognition normal.        Judgment: Judgment normal.     Comments: Insight is good.     Lab Review:     Component Value Date/Time   NA 139 07/25/2019 1243   K 4.5 07/25/2019 1243   CL 100 07/25/2019 1243   CO2 25 07/25/2019 1243   GLUCOSE 108 (H) 07/25/2019 1243   GLUCOSE 90 03/02/2018 1127   BUN 16 07/25/2019 1243   CREATININE 0.95 07/25/2019 1243   CREATININE 1.17 07/08/2015 0835   CALCIUM 9.2 07/25/2019 1243   PROT 6.5 07/25/2019 1243   ALBUMIN 4.4 07/25/2019 1243   AST 20 07/25/2019 1243   ALT 27 07/25/2019 1243   ALKPHOS 41 (L) 07/25/2019 1243   BILITOT 0.4 07/25/2019 1243   GFRNONAA 93 07/25/2019 1243   GFRAA 107 07/25/2019 1243  Component Value Date/Time   WBC 6.7 08/14/2018 1521   WBC 7.6 11/25/2015 0102   RBC 4.21 08/14/2018 1521   RBC 4.34  11/25/2015 0102   HGB 13.6 08/14/2018 1521   HCT 41.1 08/14/2018 1521   PLT 178 08/14/2018 1521   MCV 98 (H) 08/14/2018 1521   MCH 32.3 08/14/2018 1521   MCH 32.9 11/25/2015 0102   MCHC 33.1 08/14/2018 1521   MCHC 33.8 11/25/2015 0102   RDW 12.3 08/14/2018 1521   LYMPHSABS 1.2 06/23/2014 1139   MONOABS 0.4 06/23/2014 1139   EOSABS 0.4 06/23/2014 1139   BASOSABS 0.0 06/23/2014 1139    No results found for: POCLITH, LITHIUM   No results found for: PHENYTOIN, PHENOBARB, VALPROATE, CBMZ   .res Assessment: Plan:    Allard was seen today for follow-up, adhd and anxiety.  Diagnoses and all orders for this visit:  Major depressive disorder with single episode, in full remission ([place])  Attention deficit hyperactivity disorder (ADHD), predominantly inattentive type -     lisdexamfetamine (VYVANSE) 70 MG capsule; Take 1 capsule (70 mg total) by mouth daily. -     lisdexamfetamine (VYVANSE) 70 MG capsule; Take 1 capsule (70 mg total) by mouth daily. -     lisdexamfetamine (VYVANSE) 70 MG capsule; Take 1 capsule (70 mg total) by mouth daily. -     amphetamine-dextroamphetamine (ADDERALL) 10 MG tablet; Take 1 tablet (10 mg total) by mouth daily in the afternoon. -     amphetamine-dextroamphetamine (ADDERALL) 10 MG tablet; Take 1 tablet (10 mg total) by mouth daily in the afternoon. -     amphetamine-dextroamphetamine (ADDERALL) 10 MG tablet; Take 1 tablet (10 mg total) by mouth daily in the afternoon.  Shift work sleep disorder   Continue Vyvanse 70 mg AM and occ Adderall 10 mg each pm. Option switch to [place].  Disc differences. He wants to continue what he's taking.  No change indicated.  But disc the alternative types of stimulants and durations and SE. Discussed potential benefits, risks, and side effects of stimulants with patient to include increased heart rate, palpitations, insomnia, increased anxiety, increased irritability, or decreased appetite.  Instructed patient to  contact office if experiencing any significant tolerability issues.  He's paying attention to the cardiac risk.  Sleep is better and using techniques help.  Wrestled with insomnia all his life. Sleep hygiene and ideal temps.  Good sleep tends to cycle.  Supportive therapy on job seeking.  Working on this.  Has a plan.    FU 6 mos  Lynder Parents, MD, DFAPA   Please see After Visit Summary for patient specific instructions.  Future Appointments  Date Time Provider [place]  09/16/2019  7:20 AM [facility] DEVICE REMOTES [facility]  12/16/2019  7:20 AM [facility] DEVICE REMOTES [facility]    No orders of the defined types were placed in this encounter.     -------------------------------

## 2019-09-10 MED FILL — ALLOPURINOL 100 MG TABS: 100 | 30 days supply | Qty: 30 | Fill #1

## 2019-09-10 MED FILL — VYVANSE 70 MG CAPSULE: 70 | 30 days supply | Qty: 30 | Fill #0

## 2019-09-10 MED FILL — ENTRESTO 24 MG-26 MG TABLET: 24-26 | 30 days supply | Qty: 60 | Fill #0

## 2019-10-09 MED FILL — AMPHETAMINE SALTS 10 MG: 10 | 30 days supply | Qty: 30 | Fill #0

## 2019-10-09 MED FILL — ENTRESTO 24 MG-26 MG TABLET: 24-26 | 30 days supply | Qty: 60 | Fill #1

## 2019-10-09 MED FILL — VYVANSE 70 MG CAPSULE: 70 | 30 days supply | Qty: 30 | Fill #0

## 2019-10-30 ENCOUNTER — Other Ambulatory Visit (HOSPITAL_COMMUNITY): Payer: Self-pay | Admitting: Cardiology

## 2019-10-30 MED FILL — ALLOPURINOL 100 MG TABS: 100 | 30 days supply | Qty: 30 | Fill #2

## 2019-11-04 ENCOUNTER — Encounter (HOSPITAL_COMMUNITY): Payer: Self-pay | Admitting: Physician Assistant

## 2019-11-07 ENCOUNTER — Other Ambulatory Visit: Payer: Self-pay | Admitting: Family Medicine

## 2019-11-07 ENCOUNTER — Other Ambulatory Visit: Payer: Self-pay

## 2019-11-07 ENCOUNTER — Ambulatory Visit (INDEPENDENT_AMBULATORY_CARE_PROVIDER_SITE_OTHER): Payer: Commercial Managed Care - PPO | Admitting: Family Medicine

## 2019-11-07 ENCOUNTER — Encounter: Payer: Self-pay | Admitting: Family Medicine

## 2019-11-07 VITALS — BP 135/89 | HR 94 | Ht 77.0 in | Wt 217.0 lb

## 2019-11-07 DIAGNOSIS — S161XXA Strain of muscle, fascia and tendon at neck level, initial encounter: Secondary | ICD-10-CM | POA: Diagnosis not present

## 2019-11-07 MED ORDER — HYDROCODONE-ACETAMINOPHEN 5-325 MG PO TABS: 1.0000 | ORAL_TABLET | Freq: Three times a day (TID) | ORAL | 0 refills | Status: DC | PRN

## 2019-11-07 MED ORDER — KETOROLAC TROMETHAMINE 30 MG/ML IJ SOLN
30.0000 mg | Freq: Once | INTRAMUSCULAR | Status: AC
  Administered 2019-11-07: 30 mg via INTRAMUSCULAR

## 2019-11-07 MED ORDER — METHYLPREDNISOLONE ACETATE 40 MG/ML IJ SUSP
40.0000 mg | Freq: Once | INTRAMUSCULAR | Status: AC
  Administered 2019-11-07: 40 mg via INTRAMUSCULAR

## 2019-11-07 MED FILL — HYDROCODON-APAP 5-325: 5-325 | 5 days supply | Qty: 15 | Fill #0

## 2019-11-07 NOTE — Progress Notes (Signed)
[name] - 49 y.o. male MRN [id number]  Date of birth: [birth year]  SUBJECTIVE:  Including CC & ROS.  Chief Complaint  Patient presents with  . Shoulder Pain    left    [name] is a 49 y.o. male that is presenting with acute left neck/shoulder pain.  He felt the pain over the past few days.  It is gotten significantly worse today.  He has pain with any movement.  He reports caring buckets of ice the other day.  No history of similar pain.  No improvement with medications tried thus far.  Review of Systems See HPI   HISTORY: Past Medical, Surgical, Social, and Family History Reviewed & Updated per EMR.   Pertinent Historical Findings include:  Past Medical History:  Diagnosis Date  . ADHD (attention deficit hyperactivity disorder)   . AICD (automatic cardioverter/defibrillator) present 06/30/2014   BiV ICD Insertion CRT-D  . Asthma   . ETOH abuse   . Heart murmur   . Nonischemic cardiomyopathy ([place])   . PAF (paroxysmal atrial fibrillation) ([place])   . Pneumonia ~ 01/2014  . Syncope    episode occured Feb 2011, ICD placed  . Systolic congestive heart failure ([place])    With ejection fraction of 25%, normal coronaries on Cath 02/2010  . 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([place]) 1998   "right"  . Ventricular tachycardia [facility])     Past Surgical History:  Procedure Laterality Date  . CARDIAC DEFIBRILLATOR PLACEMENT  2014   "single lead; Medtronic"  . EP IMPLANTABLE DEVICE N/A 06/30/2014   Procedure: BiV ICD Insertion CRT-D;  Surgeon: Deboraha Sprang, MD;  Location: [facility];  Service: Cardiovascular;  Laterality: N/A;  . FRACTURE SURGERY Left ~ 1988  . ORCHIECTOMY Right 1998   with abdominal lymph node dissection  . WRIST FRACTURE SURGERY      Family History  Problem Relation Age of Onset  . Heart disease Mother        mother and sister with valve problems  . Pancreatic cancer Maternal Grandfather   . Cardiomyopathy Neg Hx     Social History   Socioeconomic  History  . Marital status: Married    Spouse name: Not on file  . Number of children: Not on file  . Years of education: Not on file  . Highest education level: Not on file  Occupational History  . Occupation: Part time Programme researcher, broadcasting/film/video  Tobacco Use  . Smoking status: Passive Smoke Exposure - Never Smoker  . Smokeless tobacco: Never Used  . 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 Vaping Use: Never used  Substance and Sexual Activity  . Alcohol use: Yes    Alcohol/week: 28.0 standard drinks    Types: 7 Glasses of wine, 21 Cans of beer per week    Comment: 06/30/2014 "2-6 beer2 or glasses of wine/day; probably 2/3 of these beers"  . Drug use: No  . Sexual activity: Not Currently  Other Topics Concern  . Not on file  Social History Narrative  . Not on file   Social Determinants of Health   Financial Resource Strain:   . Difficulty of Paying Living Expenses: Not on file  Food Insecurity:   . Worried About Charity fundraiser in the Last Year: Not on file  . Ran Out of Food in the Last Year: Not on file  Transportation Needs:   . Lack  of Transportation (Medical): Not on file  . Lack of Transportation (Non-Medical): Not on file  Physical Activity:   . Days of Exercise per Week: Not on file  . Minutes of Exercise per Session: Not on file  Stress:   . Feeling of Stress : Not on file  Social Connections:   . Frequency of Communication with Friends and Family: Not on file  . Frequency of Social Gatherings with Friends and Family: Not on file  . Attends Religious Services: Not on file  . Active Member of Clubs or Organizations: Not on file  . Attends Archivist Meetings: Not on file  . Marital Status: Not on file  Intimate Partner Violence:   . Fear of Current or Ex-Partner: Not on file  . Emotionally Abused: Not on file  . Physically Abused: Not on file  . Sexually Abused: Not on file     PHYSICAL EXAM:   VS: BP 135/89   Pulse 94   Ht 6\' 5"  (1.956 m)   Wt 217 lb (98.4 kg)   BMI 25.73 kg/m  Physical Exam Gen: NAD, alert, cooperative with exam, well-appearing MSK:  Neck: Limited extension. Normal flexion. Significant tenderness to palpation over the trapezius and paraspinal cervical muscles. Normal shoulder external rotation. Neurovascularly intact     ASSESSMENT & PLAN:   Cervical strain Symptoms seem more consistent with a strain of the neck versus a radicular component.  Seems less likely to be associated with the shoulder at this point. -Counseled on home exercise therapy and supportive care. -Sling. -Intramuscular Toradol and Depo-Medrol. -Follow-up in 1 to 2 weeks.  Could consider physical therapy or injection or imaging if needed.

## 2019-11-07 NOTE — Assessment & Plan Note (Signed)
Symptoms seem more consistent with a strain of the neck versus a radicular component.  Seems less likely to be associated with the shoulder at this point. -Counseled on home exercise therapy and supportive care. -Sling. -Intramuscular Toradol and Depo-Medrol. -Follow-up in 1 to 2 weeks.  Could consider physical therapy or injection or imaging if needed.

## 2019-11-07 NOTE — Patient Instructions (Signed)
Good to see you Please try heat  Please try the movements  Please use the norco for severe pain   Please send me a message in MyChart with any questions or updates.  Please see me back in 1-2 weeks.   --Dr. Raeford Razor

## 2019-11-12 ENCOUNTER — Other Ambulatory Visit: Payer: Self-pay | Admitting: Family Medicine

## 2019-11-12 ENCOUNTER — Telehealth: Payer: Self-pay | Admitting: Family Medicine

## 2019-11-12 MED ORDER — METHOCARBAMOL 500 MG PO TABS: 500.0000 mg | ORAL_TABLET | Freq: Two times a day (BID) | ORAL | 0 refills | Status: DC | PRN

## 2019-11-12 MED ORDER — GABAPENTIN 300 MG PO CAPS
300.0000 mg | ORAL_CAPSULE | Freq: Three times a day (TID) | ORAL | 1 refills | Status: DC
Start: 2019-11-12 — End: 2022-12-20

## 2019-11-12 MED FILL — METHOCARBAMOL 500 MG TABS: 500 | 15 days supply | Qty: 30 | Fill #0

## 2019-11-12 MED FILL — VYVANSE 70 MG CAPSULE: 70 | 30 days supply | Qty: 30 | Fill #0

## 2019-11-12 MED FILL — GABAPENTIN 300 MG CAPSULE: 300 | 20 days supply | Qty: 60 | Fill #0

## 2019-11-12 NOTE — Telephone Encounter (Signed)
Patient called states he is still in excruciating pain with Shoulder & would like someone to call him back to discuss pain relief alternatives.  --Forwarding message to provider/med asst to contact pt. MRN:  [id number] Phone:  [phone] --glh

## 2019-11-12 NOTE — Telephone Encounter (Signed)
Spoke with patient about his symptoms. Will provide gabapentin and robaxin.   Rosemarie Ax, MD [facility] 11/12/2019, 12:40 PM

## 2019-11-14 ENCOUNTER — Telehealth (HOSPITAL_COMMUNITY): Payer: Self-pay | Admitting: Physician Assistant

## 2019-11-14 ENCOUNTER — Ambulatory Visit (INDEPENDENT_AMBULATORY_CARE_PROVIDER_SITE_OTHER): Payer: Commercial Managed Care - PPO | Admitting: Family Medicine

## 2019-11-14 ENCOUNTER — Ambulatory Visit (HOSPITAL_BASED_OUTPATIENT_CLINIC_OR_DEPARTMENT_OTHER)
Admission: RE | Admit: 2019-11-14 | Discharge: 2019-11-14 | Disposition: A | Discharge status: Home / Self Care | Payer: Commercial Managed Care - PPO | Source: Ambulatory Visit | Attending: Family Medicine | Admitting: Family Medicine

## 2019-11-14 ENCOUNTER — Encounter: Payer: Self-pay | Admitting: Family Medicine

## 2019-11-14 ENCOUNTER — Ambulatory Visit: Payer: Self-pay

## 2019-11-14 ENCOUNTER — Other Ambulatory Visit: Payer: Self-pay

## 2019-11-14 VITALS — BP 149/91 | HR 79 | Ht 77.0 in | Wt 215.0 lb

## 2019-11-14 DIAGNOSIS — S161XXD Strain of muscle, fascia and tendon at neck level, subsequent encounter: Secondary | ICD-10-CM

## 2019-11-14 DIAGNOSIS — M25512 Pain in left shoulder: Secondary | ICD-10-CM | POA: Diagnosis not present

## 2019-11-14 MED ORDER — METHYLPREDNISOLONE ACETATE 40 MG/ML IJ SUSP
40.0000 mg | Freq: Once | INTRAMUSCULAR | Status: AC
Start: 2019-11-14 — End: 2019-11-14
  Administered 2019-11-14: 40 mg via INTRA_ARTICULAR

## 2019-11-14 NOTE — Assessment & Plan Note (Signed)
Has had improvement with the gabapentin.  Does still have trigger points in the periscapular region. -Counseled on home exercise therapy and supportive care. -X-ray. -Cervical soft collar. -May need to consider further imaging or physical therapy.

## 2019-11-14 NOTE — Addendum Note (Signed)
Addended by: Sherrie George F on: 11/14/2019 03:27 PM   Modules accepted: Orders

## 2019-11-14 NOTE — Progress Notes (Signed)
[name] - 49 y.o. male MRN [id number]  Date of birth: 03/04/1970  SUBJECTIVE:  Including CC & ROS.  Chief Complaint  Patient presents with  . Follow-up    neck and left shoulder    [name] is a 49 y.o. male that is presenting with acute worsening of his left shoulder pain and his left neck pain.  Has gotten some improvement with the gabapentin.  Still feels significant pain around the left shoulder.   Review of Systems See HPI   HISTORY: Past Medical, Surgical, Social, and Family History Reviewed & Updated per EMR.   Pertinent Historical Findings include:  Past Medical History:  Diagnosis Date  . ADHD (attention deficit hyperactivity disorder)   . AICD (automatic cardioverter/defibrillator) present 06/30/2014   BiV ICD Insertion CRT-D  . Asthma   . ETOH abuse   . Heart murmur   . Nonischemic cardiomyopathy ([place])   . PAF (paroxysmal atrial fibrillation) ([place])   . Pneumonia ~ 01/2014  . Syncope    episode occured Feb 2011, ICD placed  . Systolic congestive heart failure ([place])    With ejection fraction of 25%, normal coronaries on Cath 02/2010  . 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([place]) 1998   "right"  . Ventricular tachycardia [facility])     Past Surgical History:  Procedure Laterality Date  . CARDIAC DEFIBRILLATOR PLACEMENT  2014   "single lead; Medtronic"  . EP IMPLANTABLE DEVICE N/A 06/30/2014   Procedure: BiV ICD Insertion CRT-D;  Surgeon: Deboraha Sprang, MD;  Location: [facility];  Service: Cardiovascular;  Laterality: N/A;  . FRACTURE SURGERY Left ~ 1988  . ORCHIECTOMY Right 1998   with abdominal lymph node dissection  . WRIST FRACTURE SURGERY      Family History  Problem Relation Age of Onset  . Heart disease Mother        mother and sister with valve problems  . Pancreatic cancer Maternal Grandfather   . Cardiomyopathy Neg Hx     Social History   Socioeconomic History  . Marital status: Married    Spouse name: Not on file  . Number of  children: Not on file  . Years of education: Not on file  . Highest education level: Not on file  Occupational History  . Occupation: Part time Programme researcher, broadcasting/film/video  Tobacco Use  . Smoking status: Passive Smoke Exposure - Never Smoker  . Smokeless tobacco: Never Used  . 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 Vaping Use: Never used  Substance and Sexual Activity  . Alcohol use: Yes    Alcohol/week: 28.0 standard drinks    Types: 7 Glasses of wine, 21 Cans of beer per week    Comment: 06/30/2014 "2-6 beer2 or glasses of wine/day; probably 2/3 of these beers"  . Drug use: No  . Sexual activity: Not Currently  Other Topics Concern  . Not on file  Social History Narrative  . Not on file   Social Determinants of Health   Financial Resource Strain:   . Difficulty of Paying Living Expenses: Not on file  Food Insecurity:   . Worried About Charity fundraiser in the Last Year: Not on file  . Ran Out of Food in the Last Year: Not on file  Transportation Needs:   . Lack of Transportation (Medical): Not on file  . Lack of Transportation (Non-Medical): Not on file  Physical Activity:   .  Days of Exercise per Week: Not on file  . Minutes of Exercise per Session: Not on file  Stress:   . Feeling of Stress : Not on file  Social Connections:   . Frequency of Communication with Friends and Family: Not on file  . Frequency of Social Gatherings with Friends and Family: Not on file  . Attends Religious Services: Not on file  . Active Member of Clubs or Organizations: Not on file  . Attends Archivist Meetings: Not on file  . Marital Status: Not on file  Intimate Partner Violence:   . Fear of Current or Ex-Partner: Not on file  . Emotionally Abused: Not on file  . Physically Abused: Not on file  . Sexually Abused: Not on file     PHYSICAL EXAM:  VS: BP (!) 149/91   Pulse 79   Ht 6\' 5"  (1.956 m)   Wt 215 lb (97.5 kg)    BMI 25.50 kg/m  Physical Exam Gen: NAD, alert, cooperative with exam, well-appearing MSK:  Left shoulder: Normal range of motion. Normal strength resistance. Neurovascularly intact   Aspiration/Injection Procedure Note [name] 1970/10/22  Procedure: Injection Indications: Left shoulder pain  Procedure Details Consent: Risks of procedure as well as the alternatives and risks of each were explained to the (patient/caregiver).  Consent for procedure obtained. Time Out: Verified patient identification, verified procedure, site/side was marked, verified correct patient position, special equipment/implants available, medications/allergies/relevent history reviewed, required imaging and test results available.  Performed.  The area was cleaned with iodine and alcohol swabs.    The left subacromial space was injected using 1 cc's of 40 mg Depo-Medrol and 4 cc's of 0.25% bupivacaine with a 22 1 1/2" needle.  Ultrasound was used. Images were obtained in long views showing the injection.     A sterile dressing was applied.  Patient did tolerate procedure well.     ASSESSMENT & PLAN:   Cervical strain Has had improvement with the gabapentin.  Does still have trigger points in the periscapular region. -Counseled on home exercise therapy and supportive care. -X-ray. -Cervical soft collar. -May need to consider further imaging or physical therapy.  Acute pain of left shoulder Unclear if his pain is still associated with his shoulder versus his neck or nerves. -Counseled supportive care. -Subacromial injection.

## 2019-11-14 NOTE — Assessment & Plan Note (Signed)
Unclear if his pain is still associated with his shoulder versus his neck or nerves. -Counseled supportive care. -Subacromial injection.

## 2019-11-14 NOTE — Telephone Encounter (Signed)
Just an FYI. We have made several attempts to contact this patient including sending a letter to schedule or reschedule their echocardiogram. We will be removing the patient from the echo [place].   11/04/19 MAILED LETTER [facility]  10/31/19 [facility] to schedule @ 10:11am/[facility]  10/28/19 [facility] to schedule @ 4:01/[facility]  07/26/19 @1248  spoke with patient he wants to hold off for a few months. He will call back when ready to schedule evd     Thank you

## 2019-11-14 NOTE — Patient Instructions (Signed)
Good to see you  Please try the soft collar  Please continue the gabapentin.  I will call with the results from today  Please send me a message in MyChart with any questions or updates.  Please see me back in 1-2 weeks.   --Dr. Raeford Razor

## 2019-11-15 ENCOUNTER — Telehealth: Payer: Self-pay | Admitting: Family Medicine

## 2019-11-15 NOTE — Telephone Encounter (Signed)
Informed of results.   Rosemarie Ax, MD [facility] 11/15/2019, 12:55 PM

## 2019-11-19 ENCOUNTER — Telehealth: Payer: Self-pay | Admitting: Family Medicine

## 2019-11-19 NOTE — Telephone Encounter (Signed)
Patient called, wanted provider to know his pain is decreasing gradually ,the Rx helped get it under control & he now wants to know what is the Next Step in the process (ask provider to contact him @ [phone]).  Forwarding message to provider for follow-up w/patient.  -glh

## 2019-11-19 NOTE — Telephone Encounter (Signed)
Answered his questions.   Rosemarie Ax, MD [facility] 11/19/2019, 4:30 PM

## 2019-11-22 ENCOUNTER — Ambulatory Visit: Payer: Commercial Managed Care - PPO | Admitting: Family Medicine

## 2019-11-25 ENCOUNTER — Other Ambulatory Visit (HOSPITAL_COMMUNITY): Payer: Self-pay | Admitting: Cardiology

## 2019-11-25 MED FILL — ENTRESTO 24 MG-26 MG TABLET: 24-26 | 30 days supply | Qty: 60 | Fill #2

## 2019-11-27 ENCOUNTER — Other Ambulatory Visit (HOSPITAL_COMMUNITY): Payer: Self-pay | Admitting: Cardiology

## 2019-11-27 MED FILL — SILDENAFIL CITRATE 50 MG TA: 50 | 30 days supply | Qty: 6 | Fill #0

## 2019-12-16 ENCOUNTER — Other Ambulatory Visit: Payer: Self-pay | Admitting: Psychiatry

## 2019-12-16 ENCOUNTER — Ambulatory Visit (INDEPENDENT_AMBULATORY_CARE_PROVIDER_SITE_OTHER): Payer: Commercial Managed Care - PPO

## 2019-12-16 DIAGNOSIS — I429 Cardiomyopathy, unspecified: Secondary | ICD-10-CM | POA: Diagnosis not present

## 2019-12-16 DIAGNOSIS — F900 Attention-deficit hyperactivity disorder, predominantly inattentive type: Secondary | ICD-10-CM

## 2019-12-17 LAB — CUP PACEART REMOTE DEVICE CHECK
Battery Remaining Longevity: 28 mo
Battery Voltage: 2.94 V
Brady Statistic AP VP Percent: 0.05 %
Brady Statistic AP VS Percent: 0 %
Brady Statistic AS VP Percent: 98.55 %
Brady Statistic AS VS Percent: 1.4 %
Brady Statistic RA Percent Paced: 0.06 %
Brady Statistic RV Percent Paced: 0.88 %
Date Time Interrogation Session: 20211213031704
HighPow Impedance: 41 Ohm
HighPow Impedance: 53 Ohm
Implantable Lead Implant Date: 20120208
Implantable Lead Implant Date: 20160627
Implantable Lead Implant Date: 20160627
Implantable Lead Location: 753858
Implantable Lead Location: 753860
Implantable Lead Location: 753860
Implantable Lead Model: 185
Implantable Lead Model: 4598
Implantable Lead Model: 5076
Implantable Lead Serial Number: 345409
Implantable Pulse Generator Implant Date: 20160627
Lead Channel Impedance Value: 342 Ohm
Lead Channel Impedance Value: 361 Ohm
Lead Channel Impedance Value: 361 Ohm
Lead Channel Impedance Value: 361 Ohm
Lead Channel Impedance Value: 399 Ohm
Lead Channel Impedance Value: 399 Ohm
Lead Channel Impedance Value: 513 Ohm
Lead Channel Impedance Value: 589 Ohm
Lead Channel Impedance Value: 608 Ohm
Lead Channel Impedance Value: 646 Ohm
Lead Channel Impedance Value: 817 Ohm
Lead Channel Impedance Value: 874 Ohm
Lead Channel Impedance Value: 874 Ohm
Lead Channel Pacing Threshold Amplitude: 0.5 V
Lead Channel Pacing Threshold Amplitude: 0.5 V
Lead Channel Pacing Threshold Amplitude: 1.875 V
Lead Channel Pacing Threshold Pulse Width: 0.4 ms
Lead Channel Pacing Threshold Pulse Width: 0.4 ms
Lead Channel Pacing Threshold Pulse Width: 0.4 ms
Lead Channel Sensing Intrinsic Amplitude: 1.125 mV
Lead Channel Sensing Intrinsic Amplitude: 1.125 mV
Lead Channel Sensing Intrinsic Amplitude: 6.625 mV
Lead Channel Sensing Intrinsic Amplitude: 6.625 mV
Lead Channel Setting Pacing Amplitude: 1.25 V
Lead Channel Setting Pacing Amplitude: 1.5 V
Lead Channel Setting Pacing Amplitude: 2 V
Lead Channel Setting Pacing Pulse Width: 0.4 ms
Lead Channel Setting Pacing Pulse Width: 0.8 ms
Lead Channel Setting Sensing Sensitivity: 0.45 mV

## 2019-12-17 MED FILL — VYVANSE 70 MG CAPSULE: 70 | 30 days supply | Qty: 30 | Fill #0

## 2019-12-20 ENCOUNTER — Other Ambulatory Visit: Payer: Self-pay

## 2019-12-20 ENCOUNTER — Ambulatory Visit: Payer: Self-pay

## 2019-12-20 ENCOUNTER — Ambulatory Visit (INDEPENDENT_AMBULATORY_CARE_PROVIDER_SITE_OTHER): Payer: Commercial Managed Care - PPO | Admitting: Family Medicine

## 2019-12-20 ENCOUNTER — Encounter: Payer: Self-pay | Admitting: Family Medicine

## 2019-12-20 VITALS — BP 133/83 | HR 83 | Ht 77.0 in | Wt 217.0 lb

## 2019-12-20 DIAGNOSIS — M25512 Pain in left shoulder: Secondary | ICD-10-CM | POA: Diagnosis not present

## 2019-12-20 NOTE — Assessment & Plan Note (Addendum)
Signifianct changes of the left [place] joint on [place].  Hyperemia and synovitis of this area.  No changes observed of the left shoulder.  Does have an elevated uric acid unclear if related to gout.  Previous inflammatory markers have been normal. -Counseled on taking Rayos. -Either MRI or CT to evaluate for synovial changes.

## 2019-12-20 NOTE — Patient Instructions (Signed)
Good to see you Please take 1 rayos at 10 pm.  You should get a call to schedule the CT scan   Please send me a message in MyChart with any questions or updates.  We will schedule a virtual visit once the CT is resulted.   --Dr. Raeford Razor

## 2019-12-20 NOTE — Progress Notes (Signed)
Medication Samples have been provided to the patient.  Drug name: Rayos       Strength: 5mg         Qty: 1 box  LOT: [id number]  Exp.Date: 07/2020  Dosing instructions: Take 1 tablet as close to 10 pm as possible.  The patient has been instructed regarding the correct time, dose, and frequency of taking this medication, including desired effects and most common side effects.   Sherrie George, [place] 11:52 AM 12/20/2019

## 2019-12-20 NOTE — Progress Notes (Signed)
[name] - 49 y.o. male MRN [id number]  Date of birth: [birth year]  SUBJECTIVE:  Including CC & ROS.  No chief complaint on file.   [name] is a 49 y.o. male that is presenting with worsening of his left shoulder pain.  He appreciates most of the pain around the sternoclavicular joint.  He is having swelling as well as tightness of the left pectoralis major.  He feels that this initially was the source of his pain.  Has gotten some improvement intermittently with the prednisone..   Review of Systems See HPI   HISTORY: Past Medical, Surgical, Social, and Family History Reviewed & Updated per EMR.   Pertinent Historical Findings include:  Past Medical History:  Diagnosis Date  . ADHD (attention deficit hyperactivity disorder)   . AICD (automatic cardioverter/defibrillator) present 06/30/2014   BiV ICD Insertion CRT-D  . Asthma   . ETOH abuse   . Heart murmur   . Nonischemic cardiomyopathy ([place])   . PAF (paroxysmal atrial fibrillation) ([place])   . Pneumonia ~ 01/2014  . Syncope    episode occured Feb 2011, ICD placed  . Systolic congestive heart failure ([place])    With ejection fraction of 25%, normal coronaries on Cath 02/2010  . 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([place]) 1998   "right"  . Ventricular tachycardia [facility])     Past Surgical History:  Procedure Laterality Date  . CARDIAC DEFIBRILLATOR PLACEMENT  2014   "single lead; Medtronic"  . EP IMPLANTABLE DEVICE N/A 06/30/2014   Procedure: BiV ICD Insertion CRT-D;  Surgeon: Deboraha Sprang, MD;  Location: [facility];  Service: Cardiovascular;  Laterality: N/A;  . FRACTURE SURGERY Left ~ 1988  . ORCHIECTOMY Right 1998   with abdominal lymph node dissection  . WRIST FRACTURE SURGERY      Family History  Problem Relation Age of Onset  . Heart disease Mother        mother and sister with valve problems  . Pancreatic cancer Maternal Grandfather   . Cardiomyopathy Neg Hx     Social History   Socioeconomic History   . Marital status: Married    Spouse name: Not on file  . Number of children: Not on file  . Years of education: Not on file  . Highest education level: Not on file  Occupational History  . Occupation: Part time Programme researcher, broadcasting/film/video  Tobacco Use  . Smoking status: Passive Smoke Exposure - Never Smoker  . Smokeless tobacco: Never Used  . 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 Vaping Use: Never used  Substance and Sexual Activity  . Alcohol use: Yes    Alcohol/week: 28.0 standard drinks    Types: 7 Glasses of wine, 21 Cans of beer per week    Comment: 06/30/2014 "2-6 beer2 or glasses of wine/day; probably 2/3 of these beers"  . Drug use: No  . Sexual activity: Not Currently  Other Topics Concern  . Not on file  Social History Narrative  . Not on file   Social Determinants of Health   Financial Resource Strain: Not on file  Food Insecurity: Not on file  Transportation Needs: Not on file  Physical Activity: Not on file  Stress: Not on file  Social Connections: Not on file  Intimate Partner Violence: Not on file     PHYSICAL EXAM:  VS: There were no vitals taken for this visit. Physical Exam Gen: NAD,  alert, cooperative with exam, well-appearing MSK:  Left chest: Abnormal appearance of the pectoralis. The pectoralis major tendon is intact. Normal external rotation and flexion and abduction of the shoulder. Tenderness palpation of the left sternoclavicular joint. Neurovascularly intact  Limited ultrasound: Left chest:  Normal-appearing AC joint. Normal-appearing biceps tendon long and short axis. Normal subscapularis and supraspinatus. Increased synovium at the left sternoclavicular joint when compared to the contralateral side.  Increased hyperemia all throughout this area.  Summary: Synovitis of the left sternoclavicular joint.  Ultrasound and interpretation by Clearance Coots, MD    ASSESSMENT & PLAN:    No problem-specific Assessment & Plan notes found for this encounter.

## 2019-12-25 ENCOUNTER — Other Ambulatory Visit: Payer: Self-pay | Admitting: Family Medicine

## 2019-12-25 ENCOUNTER — Telehealth: Payer: Self-pay | Admitting: Family Medicine

## 2019-12-25 MED ORDER — RAYOS 5 MG PO TBEC: 5.0000 mg | DELAYED_RELEASE_TABLET | Freq: Every day | ORAL | 1 refills | Status: DC

## 2019-12-25 MED ORDER — BACLOFEN 10 MG PO TABS: 5.0000 mg | ORAL_TABLET | Freq: Two times a day (BID) | ORAL | 1 refills | Status: DC | PRN

## 2019-12-25 MED FILL — BACLOFEN 10 MG TABS: 10 | 30 days supply | Qty: 30 | Fill #0

## 2019-12-25 NOTE — Telephone Encounter (Signed)
Provided baclofen and rayos.   Rosemarie Ax, MD [facility] 12/25/2019, 1:12 PM

## 2019-12-25 NOTE — Addendum Note (Signed)
Addended by: Rosemarie Ax on: 12/25/2019 08:57 AM   Modules accepted: Orders

## 2019-12-25 NOTE — Telephone Encounter (Signed)
Patient called asking for refill on :  Rayos & a new script for Baclofen(sp) says it was discussed @ 12/17 OV w/provider.  --Pt uses:   [place], [address] Phone:  [phone]  Fax:  [phone]     --forwarding request to provider for review.  --glh

## 2019-12-30 NOTE — Progress Notes (Signed)
Remote ICD transmission.   

## 2020-01-07 ENCOUNTER — Other Ambulatory Visit (HOSPITAL_COMMUNITY): Payer: Self-pay | Admitting: Cardiology

## 2020-01-07 ENCOUNTER — Other Ambulatory Visit: Payer: Self-pay | Admitting: Internal Medicine

## 2020-01-07 MED FILL — CARVEDILOL 12.5 MG TABLET: 12.5 | 90 days supply | Qty: 180 | Fill #0

## 2020-01-07 MED FILL — SOTALOL HCL 80 MG TABS: 80 | 90 days supply | Qty: 180 | Fill #0

## 2020-01-13 ENCOUNTER — Encounter: Payer: Self-pay | Admitting: Family Medicine

## 2020-01-14 ENCOUNTER — Telehealth: Payer: Self-pay | Admitting: Internal Medicine

## 2020-01-14 ENCOUNTER — Encounter: Payer: Self-pay | Admitting: *Deleted

## 2020-01-14 ENCOUNTER — Other Ambulatory Visit: Payer: Self-pay | Admitting: Psychiatry

## 2020-01-14 ENCOUNTER — Other Ambulatory Visit: Payer: Self-pay

## 2020-01-14 ENCOUNTER — Other Ambulatory Visit: Payer: Self-pay | Admitting: Internal Medicine

## 2020-01-14 DIAGNOSIS — F900 Attention-deficit hyperactivity disorder, predominantly inattentive type: Secondary | ICD-10-CM

## 2020-01-14 MED ORDER — ENTRESTO 24-26 MG PO TABS: 1.0000 | ORAL_TABLET | Freq: Two times a day (BID) | ORAL | 1 refills | Status: DC

## 2020-01-14 MED FILL — ENTRESTO 24 MG-26 MG TABLET: 24-26 | 90 days supply | Qty: 180 | Fill #0

## 2020-01-14 NOTE — Telephone Encounter (Signed)
*  STAT* If patient is at the pharmacy, call can be transferred to refill team.   1. Which medications need to be refilled? (please list name of each medication and dose if known)   sacubitril-valsartan (ENTRESTO) 24-26 MG     2. Which pharmacy/location (including street and city if local pharmacy) is medication to be sent to? [place], [place]  3. Do they need a 30 day or 90 day supply? 90 day supply    Patient is completely out of medication.

## 2020-01-15 ENCOUNTER — Inpatient Hospital Stay: Admission: RE | Admit: 2020-01-15 | Payer: Commercial Managed Care - PPO | Source: Ambulatory Visit

## 2020-01-15 MED FILL — VYVANSE 70 MG CAPSULE: 70 | 30 days supply | Qty: 30 | Fill #0

## 2020-01-16 ENCOUNTER — Telehealth: Payer: Self-pay

## 2020-01-16 NOTE — Telephone Encounter (Signed)
Baldwin Jamaica, PA-C  P Cv Div Heartcare Device He had a day or so of palpitations that have resolved, I asked him to send a transmission to see what it noted. He has hx of AF, SVT, VT.... just want to make sure it was nothing we need to address before his visit in a couple weeks   Thanks   Called patient to assist with sending a transmission. States he is not at home now but will send it when he gets home. Advised we will call back after we have reviewed it.

## 2020-01-17 NOTE — Telephone Encounter (Signed)
Left detailed message that transmission received and did show AF episode that started 01/14/20 @ 1546 and lasted 19 hours 43 minutes. Patient reminded to keep appointment 01/29/20 with Charlcie Cradle PA and DC # and hours left for any questions. 1 NSVT episode that was SVT but no message left concerning that event.

## 2020-01-24 NOTE — Telephone Encounter (Signed)
Well done in recognizing "VT" was in fact SVT Thanks SK

## 2020-01-26 NOTE — Progress Notes (Unsigned)
Cardiology Office Note Date:  01/26/2020  Patient ID:  [name], DOB [birth year], MRN [id number] PCP:  Patient, No Pcp Per  Electrophysiologist: Dr. Caryl Comes   Chief Complaint:  overdue visit  History of Present Illness: [name] is a 50 y.o. 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 Caryl Comes Sept 2016 who noted hx of symptomatic sustained ventricular tachycardia. Discussions were had concerning catheter ablation. It was elected to use sotalol  but that was never initiated.  He has had recurrent prolonged nonsustained ventricular tachycardia; his device has been programmed to minimize therapy for this.  He also carries hx of ETOH abuse, none for a couple years, ADHD, asthma, and CHF.    May 2016, hospital admit for PAFib, single event of AF had been noted appears at that time he was intolerant of amiodarone with side effects, and was started on Sotalol for his VT and AF, no chronic a/c was started.   He comes in today to be seen for Dr. Caryl Comes, last seen by him Aug 2020, he was doing well, reported good exercise tolerance.  Noted to have SVT terminated by antitachycardia pacing.  After discussions with the patient, we have elected to maintain ATP for tachycardia is faster than 170 less than 200 but to turn off shocks in this zone. There is mention of h/o orthostatic lightheadedness that was improved  I saw him 07/25/19: He feels well. He has felt 2x that he had Afib, the most recent in Feb, he is immediately aware of his heart beat, is fast and uncomfortable feeling.  No CP, no dizziness, near syncope or syncope, not overtly SOB but makes him feel weak, poorly. No clear trigger, though perhaps after salty meal. Outside of that he feels like he is in a good place, no CP, SOB, DOE, feels like he has good exertional capacity No near syncope or syncope Tolerating his medicines. Planned to update labs and echo.  Discussed  his CHA2DS2Vasc score (one for HF) and burden, pt was most comfortable proceeding without a/c and following AF burden.  He did not follow through with the echo.  Recently reached out via Mychart with an unusually long AF episode,  And ?? diaphrag stim  TODAY This is the 1st episode of AF since last year. He seems to recall that last year was as well in the environment of increased "slow burn" stress. What bothers him the most is the intermittent thumping/jumping from his device that he feels when he is in Afib, never feels it otherwise. Mentions early in his device history having the thumping/jumping much more often though programming all but eliminated this unless he is in Afib  Outside of the Afib doing well, no CP or SOB, no DOE. No near syncope or syncope.  He has found with gout, no other changes or new medical diagnosis'   DEVICE HISTORY: DEVICE information:   MDT, CRT-D, upgrade 06/30/14, Dr. Caryl Comes, original device 2012 secondary prevention/CM Aug 2015 appropriate shock for VT ICD shock 8/15 ATP failed to terminate VT, 24J shock failed and 35 joule shock was successful There was associated syncope    Past Medical History:  Diagnosis Date  . ADHD (attention deficit hyperactivity disorder)   . AICD (automatic cardioverter/defibrillator) present 06/30/2014   BiV ICD Insertion CRT-D  . Asthma   . ETOH abuse   . Heart murmur   . Nonischemic cardiomyopathy ([place])   . PAF (  paroxysmal atrial fibrillation) ([place])   . Pneumonia ~ 01/2014  . Syncope    episode occured Feb 2011, ICD placed  . Systolic congestive heart failure ([place])    With ejection fraction of 25%, normal coronaries on Cath 02/2010  . 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([place]) 1998   "right"  . Ventricular tachycardia [facility])     Past Surgical History:  Procedure Laterality Date  . CARDIAC DEFIBRILLATOR PLACEMENT  2014   "single lead; Medtronic"  . EP IMPLANTABLE DEVICE N/A 06/30/2014   Procedure: BiV ICD Insertion CRT-D;   Surgeon: Deboraha Sprang, MD;  Location: [facility];  Service: Cardiovascular;  Laterality: N/A;  . FRACTURE SURGERY Left ~ 1988  . ORCHIECTOMY Right 1998   with abdominal lymph node dissection  . WRIST FRACTURE SURGERY      Current Outpatient Medications  Medication Sig Dispense Refill  . albuterol (PROVENTIL HFA;VENTOLIN HFA) 108 (90 BASE) MCG/ACT inhaler Inhale 2 puffs into the lungs 4 (four) times daily as needed for wheezing. 1 Inhaler 5  . allopurinol (ZYLOPRIM) 100 MG tablet Take 1 tablet (100 mg total) by mouth daily. 30 tablet 2  . amphetamine-dextroamphetamine (ADDERALL) 10 MG tablet Take 1 tablet (10 mg total) by mouth daily in the afternoon. 30 tablet 0  . amphetamine-dextroamphetamine (ADDERALL) 10 MG tablet Take 1 tablet (10 mg total) by mouth daily in the afternoon. 30 tablet 0  . amphetamine-dextroamphetamine (ADDERALL) 10 MG tablet Take 1 tablet (10 mg total) by mouth daily in the afternoon. 30 tablet 0  . aspirin 81 MG tablet Take 81 mg by mouth daily.      . baclofen (LIORESAL) 10 MG tablet Take 0.5 tablets (5 mg total) by mouth 2 (two) times daily as needed for muscle spasms. 30 each 1  . carvedilol (COREG) 12.5 MG tablet TAKE 1 TABLET BY MOUTH TWICE DAILY 180 tablet 1  . Coenzyme Q10 (CO Q 10 PO) Take 1 tablet by mouth daily.    . fish oil-omega-3 fatty acids 1000 MG capsule Take 1 g by mouth daily.     Marland Kitchen gabapentin (NEURONTIN) 300 MG capsule Take 1 capsule (300 mg total) by mouth 3 (three) times daily. 60 capsule 1  . Glucosamine-Chondroit-Vit C-Mn (GLUCOSAMINE 1500 COMPLEX PO) Take 1 tablet by mouth daily.      Marland Kitchen HYDROcodone-acetaminophen (NORCO/VICODIN) 5-325 MG tablet Take 1 tablet by mouth every 8 (eight) hours as needed. 15 tablet 0  . lisdexamfetamine (VYVANSE) 70 MG capsule Take 1 capsule (70 mg total) by mouth daily. 30 capsule 0  . lisdexamfetamine (VYVANSE) 70 MG capsule Take 1 capsule (70 mg total) by mouth daily. 30 capsule 0  . MAGNESIUM OXIDE PO Take  by mouth daily. Helps with sleep    . Multiple Vitamin (MULTIVITAMIN WITH MINERALS) TABS tablet Take 1 tablet by mouth daily.    . predniSONE (RAYOS) 5 MG TBEC Take 5 mg by mouth at bedtime. 14 tablet 1  . sacubitril-valsartan (ENTRESTO) 24-26 MG Take 1 tablet by mouth 2 (two) times daily. 180 tablet 1  . sildenafil (VIAGRA) 50 MG tablet TAKE 1 TABLET BY MOUTH ONCE DAILY AS NEEDED FOR ERECTILE DYSFUNCTION 6 tablet 2  . sotalol (BETAPACE) 80 MG tablet TAKE 1 TABLET BY MOUTH EVERY 12 HOURS 180 tablet 1  . spironolactone (ALDACTONE) 25 MG tablet TAKE 1 TABLET BY MOUTH ONCE DAILY 30 tablet 6  . Turmeric 500 MG CAPS Take 1,500 mg by mouth daily.     Marland Kitchen VYVANSE 70 MG capsule  TAKE 1 CAPSULE BY MOUTH ONCE A DAY 30 capsule 0   No current facility-administered medications for this visit.    Allergies:   Patient has no known allergies.   Social History:  The patient  reports that he is a non-smoker but has been exposed to tobacco smoke. He has never used smokeless tobacco. He reports current alcohol use of about 28.0 standard drinks of alcohol per week. He reports that he does not use drugs.   Family History:  The patient's family history includes Heart disease in his mother; Pancreatic cancer in his maternal grandfather.  ROS:  Please see the history of present illness.  All other systems are reviewed and otherwise negative.   PHYSICAL EXAM:  VS:  There were no vitals taken for this visit. BMI: There is no height or weight on file to calculate BMI. Well nourished, well developed, in no acute distress  HEENT: normocephalic, atraumatic  Neck: no JVD, carotid bruits or masses Cardiac:  RRR; no significant murmurs, no rubs, or gallops Lungs:  CTA b/l, no wheezing, rhonchi or rales  Abd: soft, nontender MS: no deformity or atrophy Ext:   no edema  Skin: warm and dry, no rash Neuro:  No gross deficits appreciated Psych: euthymic mood, full affect  ICD site is stable, no tethering or  discomfort   EKG:  Done today and reviewed by myself shows  SR 67bpm, LAD, icRBBB, QTc 460ms 07/25/19: SR 72bpm, PR 11ms, QRS 149ms, QT 438ms/QTc 424ms QT stable, appears similr to prior  Device interrogation done today and reviewed by myself : Battery and lead measurements are good VP 98% Note his AFib episodes, also has an ATach VS episode is AT and Afib NSVT episodes are within his AFib and like likely trye NSVT Nothing treated   09/19/2016: TTE Study Conclusions  - Left ventricle: The cavity size was moderately dilated. There was  mild concentric hypertrophy. Systolic function was mildly  reduced. The estimated ejection fraction was in the range of 45%  to 50%. Wall motion was normal; there were no regional wall  motion abnormalities. Doppler parameters are consistent with  abnormal left ventricular relaxation (grade 1 diastolic  dysfunction). There was no evidence of elevated ventricular  filling pressure by Doppler parameters.  - Aortic root: The aortic root was mildly dilated measuring 42 mm.  - Ascending aorta: The ascending aorta was normal in size.  - Mitral valve: There was mild regurgitation.  - Left atrium: The atrium was mildly dilated.  - Right ventricle: Pacer wire or catheter noted in right ventricle.  Systolic function was normal.  - Right atrium: Pacer wire or catheter noted in right atrium.  - Tricuspid valve: There was mild regurgitation.  - Inferior vena cava: The vessel was normal in size.  - Pericardium, extracardiac: There was no pericardial effusion.   Impressions:  - When compared to the prior study from 05/26/2014 LVEF has improved  from 30% to 45-50% with diffuse hypokinesis. LV remains  moderately dilated.    05/26/14: Echocardiogram Study Conclusions - Left ventricle: The cavity size was mildly dilated. Wall  thickness was increased in a pattern of mild LVH. The estimated  ejection fraction was 30%. Diffuse  hypokinesis. Septal-lateral  dyssynchrony noted. Features are consistent with a pseudonormal  left ventricular filling pattern, with concomitant abnormal  relaxation and increased filling pressure (grade 2 diastolic  dysfunction). - Aortic valve: There was no stenosis. - Aorta: Mildly dilated aortic root. Aortic root dimension: 41 mm  (  ED). - Mitral valve: There was trivial regurgitation. - Left atrium: The atrium was mildly dilated. - Right ventricle: The cavity size was normal. Pacer wire or  catheter noted in right ventricle. Systolic function was normal. - Right atrium: The atrium was mildly dilated. - Pulmonary arteries: No complete TR doppler jet so unable to  estimate PA systolic pressure. - Inferior vena cava: The vessel was normal in size. The  respirophasic diameter changes were in the normal range (= 50%),  consistent with normal central venous pressure. Impressions: - Mildly dilated LV with mild LV hypertrophy. EF 30% with diffuse  hypokinesis and septal-lateral dyssynchrony. Normal RV size and  systolic function. No significant valvular abnormalities.    Recent Labs: 07/25/2019: ALT 27; BUN 16; Creatinine, Ser 0.95; Magnesium 2.1; Potassium 4.5; Sodium 139  No results found for requested labs within last 8760 hours.   CrCl cannot be calculated (Patient's most recent lab result is older than the maximum 21 days allowed.).   Wt Readings from Last 3 Encounters:  12/20/19 217 lb (98.4 kg)  11/14/19 215 lb (97.5 kg)  11/07/19 217 lb (98.4 kg)     Other studies reviewed: Additional studies/records reviewed today include: summarized above   ASSESSMENT AND PLAN:  1. ICD     Intact function, programmed as above     He describes phrenic/diaphragmatic stim when in Afib, feels the jolt/spasm once every coule minutes and is the most intolerable part of his AF. Interestingly, when in AT he is all AS/VS When in Afib he is largely VS with BV paced beats that  are likely rigger pacing and these occur more frequently then he feels. I am unable to reproduce today with high output pacing on his RV and LV leads  I will ask Dr. Caryl Comes for his thoughts  2. NICM 3. Chronic CHF     Some improvement in his LVEF by echo in 2018, 45-50%     No symptoms or exam findings of volume OL     OptiVol looks great     On BB, Entresto, aldactone     Update his echo      4. VT     None noted     On sotalol, QTc stable     Labs today  5. SVT      As above, gets success with ATP in this zone (noted previously and programmed by Dr. Caryl Comes  6. PAFib     CHA2DS2Vasc remains one.     Low burden, one a year.  This episode lasted a couple days, the longest in a long time     I do not think this is a failure of his sotalol.         Discussed importance of stress management strategies   Disposition: continue remotes as usual, back in 56mo, sooner if needed   Current medicines are reviewed at length with the patient today.  The patient did not have any concerns regarding medicines.  Haywood Lasso, PA-C 01/26/2020 9:14 AM     [place] [id number] [phone] (office)  [phone] (fax)

## 2020-01-29 ENCOUNTER — Other Ambulatory Visit: Payer: Self-pay

## 2020-01-29 ENCOUNTER — Encounter: Payer: Self-pay | Admitting: Physician Assistant

## 2020-01-29 ENCOUNTER — Ambulatory Visit (INDEPENDENT_AMBULATORY_CARE_PROVIDER_SITE_OTHER): Payer: Commercial Managed Care - PPO | Admitting: Physician Assistant

## 2020-01-29 ENCOUNTER — Encounter: Payer: Self-pay | Admitting: Family Medicine

## 2020-01-29 VITALS — BP 118/72 | HR 75 | Ht 77.0 in | Wt 207.0 lb

## 2020-01-29 DIAGNOSIS — I480 Paroxysmal atrial fibrillation: Secondary | ICD-10-CM | POA: Diagnosis not present

## 2020-01-29 DIAGNOSIS — I5022 Chronic systolic (congestive) heart failure: Secondary | ICD-10-CM

## 2020-01-29 DIAGNOSIS — I428 Other cardiomyopathies: Secondary | ICD-10-CM | POA: Diagnosis not present

## 2020-01-29 DIAGNOSIS — I472 Ventricular tachycardia, unspecified: Secondary | ICD-10-CM

## 2020-01-29 DIAGNOSIS — I471 Supraventricular tachycardia: Secondary | ICD-10-CM

## 2020-01-29 LAB — CUP PACEART INCLINIC DEVICE CHECK
Battery Remaining Longevity: 27 mo
Battery Voltage: 2.93 V
Brady Statistic AP VP Percent: 0.28 %
Brady Statistic AP VS Percent: 0.02 %
Brady Statistic AS VP Percent: 97.94 %
Brady Statistic AS VS Percent: 1.75 %
Brady Statistic RA Percent Paced: 0.3 %
Brady Statistic RV Percent Paced: 1.45 %
Date Time Interrogation Session: 20220126165721
HighPow Impedance: 50 Ohm
HighPow Impedance: 70 Ohm
Implantable Lead Implant Date: 20120208
Implantable Lead Implant Date: 20160627
Implantable Lead Implant Date: 20160627
Implantable Lead Location: 753858
Implantable Lead Location: 753860
Implantable Lead Location: 753860
Implantable Lead Model: 185
Implantable Lead Model: 4598
Implantable Lead Model: 5076
Implantable Lead Serial Number: 345409
Implantable Pulse Generator Implant Date: 20160627
Lead Channel Impedance Value: 1007 Ohm
Lead Channel Impedance Value: 1026 Ohm
Lead Channel Impedance Value: 399 Ohm
Lead Channel Impedance Value: 418 Ohm
Lead Channel Impedance Value: 456 Ohm
Lead Channel Impedance Value: 475 Ohm
Lead Channel Impedance Value: 475 Ohm
Lead Channel Impedance Value: 513 Ohm
Lead Channel Impedance Value: 608 Ohm
Lead Channel Impedance Value: 665 Ohm
Lead Channel Impedance Value: 722 Ohm
Lead Channel Impedance Value: 760 Ohm
Lead Channel Impedance Value: 988 Ohm
Lead Channel Pacing Threshold Amplitude: 0.5 V
Lead Channel Pacing Threshold Amplitude: 0.5 V
Lead Channel Pacing Threshold Amplitude: 1.875 V
Lead Channel Pacing Threshold Pulse Width: 0.4 ms
Lead Channel Pacing Threshold Pulse Width: 0.4 ms
Lead Channel Pacing Threshold Pulse Width: 0.4 ms
Lead Channel Sensing Intrinsic Amplitude: 0.75 mV
Lead Channel Sensing Intrinsic Amplitude: 1.375 mV
Lead Channel Sensing Intrinsic Amplitude: 13.25 mV
Lead Channel Sensing Intrinsic Amplitude: 13.375 mV
Lead Channel Setting Pacing Amplitude: 1.25 V
Lead Channel Setting Pacing Amplitude: 1.5 V
Lead Channel Setting Pacing Amplitude: 2 V
Lead Channel Setting Pacing Pulse Width: 0.4 ms
Lead Channel Setting Pacing Pulse Width: 0.8 ms
Lead Channel Setting Sensing Sensitivity: 0.45 mV

## 2020-01-29 NOTE — Patient Instructions (Signed)
Medication Instructions:   Your physician recommends that you continue on your current medications as directed. Please refer to the Current Medication list given to you today.   *If you need a refill on your cardiac medications before your next appointment, please call your pharmacy*   Lab Work:  [facility] AND [place]    If you have labs (blood work) drawn today and your tests are completely normal, you will receive your results only by: Marland Kitchen MyChart Message (if you have MyChart) OR . A paper copy in the mail If you have any lab test that is abnormal or we need to change your treatment, we will call you to review the results.   Testing/Procedures: Your physician has requested that you have an echocardiogram. Echocardiography is a painless test that uses sound waves to create images of your heart. It provides your doctor with information about the size and shape of your heart and how well your heart's chambers and valves are working. This procedure takes approximately one hour. There are no restrictions for this procedure.   Follow-Up: At [facility], you and your health needs are our priority.  As part of our continuing mission to provide you with exceptional heart care, we have created designated Provider Care Teams.  These Care Teams include your primary Cardiologist (physician) and Advanced Practice Providers (APPs -  Physician Assistants and Nurse Practitioners) who all work together to provide you with the care you need, when you need it.  We recommend signing up for the patient portal called "MyChart".  Sign up information is provided on this After Visit Summary.  MyChart is used to connect with patients for Virtual Visits (Telemedicine).  Patients are able to view lab/test results, encounter notes, upcoming appointments, etc.  Non-urgent messages can be sent to your provider as well.   To learn more about what you can do with MyChart, go to [email or web address].    Your next  appointment:   6 month(s)  The format for your next appointment:   In Person  Provider:   You may see Dr. Caryl Comes  or one of the following Advanced Practice Providers on your designated Care Team:    Chanetta Marshall, NP  Tommye Standard, PA-C  Legrand Como "Oda Kilts, [place]    Other Instructions

## 2020-01-30 LAB — BASIC METABOLIC PANEL
BUN/Creatinine Ratio: 13 (ref 9–20)
BUN: 12 mg/dL (ref 6–24)
CO2: 25 mmol/L (ref 20–29)
Calcium: 9.3 mg/dL (ref 8.7–10.2)
Chloride: 101 mmol/L (ref 96–106)
Creatinine, Ser: 0.92 mg/dL (ref 0.76–1.27)
GFR calc Af Amer: 112 mL/min/{1.73_m2} (ref >59)
GFR calc non Af Amer: 97 mL/min/{1.73_m2} (ref >59)
Glucose: 103 mg/dL — ABNORMAL HIGH (ref 65–99)
Potassium: 5 mmol/L (ref 3.5–5.2)
Sodium: 139 mmol/L (ref 134–144)

## 2020-01-30 LAB — MAGNESIUM: Magnesium: 2.1 mg/dL (ref 1.6–2.3)

## 2020-01-31 NOTE — Addendum Note (Signed)
Addended by: Claude Manges on: 01/31/2020 08:29 AM   Modules accepted: Orders

## 2020-02-11 ENCOUNTER — Other Ambulatory Visit: Payer: Self-pay | Admitting: Psychiatry

## 2020-02-11 DIAGNOSIS — F900 Attention-deficit hyperactivity disorder, predominantly inattentive type: Secondary | ICD-10-CM

## 2020-02-11 MED FILL — SILDENAFIL CITRATE 50 MG TA: 50 | 30 days supply | Qty: 6 | Fill #1

## 2020-02-12 MED FILL — VYVANSE 70 MG CAPSULE: 70 | 30 days supply | Qty: 30 | Fill #0

## 2020-02-17 ENCOUNTER — Telehealth (HOSPITAL_COMMUNITY): Payer: Self-pay | Admitting: Physician Assistant

## 2020-02-17 NOTE — Telephone Encounter (Signed)
Patient cancelled echocardiogram and did not wish to reschedule at this time. Order will be removed from the [facility] and if pt calls back we can reinstate the order. Thank you.

## 2020-02-19 ENCOUNTER — Encounter: Payer: Self-pay | Admitting: Psychiatry

## 2020-02-19 ENCOUNTER — Ambulatory Visit (INDEPENDENT_AMBULATORY_CARE_PROVIDER_SITE_OTHER): Payer: Commercial Managed Care - PPO | Admitting: Psychiatry

## 2020-02-19 ENCOUNTER — Other Ambulatory Visit: Payer: Self-pay | Admitting: Psychiatry

## 2020-02-19 ENCOUNTER — Other Ambulatory Visit: Payer: Self-pay

## 2020-02-19 DIAGNOSIS — F325 Major depressive disorder, single episode, in full remission: Secondary | ICD-10-CM

## 2020-02-19 DIAGNOSIS — F900 Attention-deficit hyperactivity disorder, predominantly inattentive type: Secondary | ICD-10-CM

## 2020-02-19 MED ORDER — LISDEXAMFETAMINE DIMESYLATE 70 MG PO CAPS: 70.0000 mg | ORAL_CAPSULE | Freq: Every day | ORAL | 0 refills | Status: DC

## 2020-02-19 MED ORDER — AMPHETAMINE-DEXTROAMPHETAMINE 10 MG PO TABS
10.0000 mg | ORAL_TABLET | Freq: Every day | ORAL | 0 refills | Status: DC
Start: 2020-02-19 — End: 2020-02-19

## 2020-02-19 MED ORDER — AMPHETAMINE-DEXTROAMPHETAMINE 10 MG PO TABS: 10.0000 mg | ORAL_TABLET | Freq: Every day | ORAL | 0 refills | Status: DC

## 2020-02-19 MED FILL — AMPHETAMINE SALTS 10 MG: 10 | 30 days supply | Qty: 30 | Fill #0

## 2020-02-19 NOTE — Progress Notes (Signed)
[name] [id number] [birth year] 50 y.o.  Subjective:   Patient ID:  [name] is a 50 y.o. (DOB [birth year]) male.  Chief Complaint:  Chief Complaint  Patient presents with  . Follow-up  . Major depressive disorder with single episode, in full remi  . Attention deficit hyperactivity disorder (ADHD), predominan    HPI [name] presents to the office today for follow-up of ADD and history of shift work sleep disorder..  +visit 02/18/19:.  No meds were changed.Continue Vyvanse 70 mg AM and occ Adderall 10 mg each pm.  08/21/19 appt with the following noted: Stressful but overall good since here.  No card problems since here.   Still doing woodworking.  Redecorated his living room at home.    Still on Vyvanse 70 and prn Adderall.  Some split double shifts. No SE.  Patient is under the care of cardiology.  His diagnoses include nonischemic cardiomyopathy, systolic congestive heart failure which is chronic, history of ventricular tachycardia, history of SVT.  He has a implantable defibrillator and pacemaker.  Pacemaker helped.   Cardiology had no concerns regarding stimulant use.  Career change decision at the beginning of the year and then Covid hit.  Had to return to [employer].  Has built and made things during Covid and other projects.  Enjoys making things.  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  Been productive.  Daughter has stayed with him since Covid a lot.  50yo.   Satisfied with ADD treatment with Vyvanse and occ immediate use Adderall 10 mg on occasion.  Needs this esp on long days.  Had trouble with the holidays with schedule and what to do.  This has prompted him to consider another vocation.  Comfortable rut for 6 years.  Needs to change.  Med wise the schedule has been more erratic requiring short-acting stimulant. Plan: no med changes.  02/19/2020 appointment with the following noted: Tumultuous several months with  good and bad.   Health overall OK except AFIB about a month ago.  Previous was a year ago.  Never needed treatment.  Thinks maybe stress triggered it. Has GF.  Supportive. On the whole anxiety is manageable but a lot of health stress issues this last year.  Ortho injuries Also has gout onset and changed diet. Changing jobs..      Patient reports stable mood and denies depressed or irritable moods.  Patient denies any recent difficulty with anxiety except as usual.  Patient denies difficulty with sleep initiation or maintenance and is better. Denies appetite disturbance.  Patient reports that energy and motivation have been good.  Patient denies any difficulty with concentration.  Patient denies any suicidal ideation.  Past Psychiatric Medication Trials: Vyvanse 70, Concerta, Adderall, Adderall XR, Daytrana,  Wellbutrin 300,  clonazepam Patient in this practice since 2004 treated for ADD  Review of Systems:  Review of Systems  Cardiovascular: Positive for palpitations.  Neurological: Negative for tremors and weakness.  Psychiatric/Behavioral: Negative for agitation, behavioral problems, confusion, decreased concentration, dysphoric mood, hallucinations, self-injury, sleep disturbance and suicidal ideas. The patient is not nervous/anxious and is not hyperactive.     Medications: I have reviewed the patient's current medications.  Current Outpatient Medications  Medication Sig Dispense Refill  . albuterol (PROVENTIL HFA;VENTOLIN HFA) 108 (90 BASE) MCG/ACT inhaler Inhale 2 puffs into the lungs 4 (four) times daily as needed for wheezing. 1 Inhaler 5  . allopurinol (ZYLOPRIM) 100 MG tablet Take 1 tablet (100  mg total) by mouth daily. 30 tablet 2  . aspirin 81 MG tablet Take 81 mg by mouth daily.    . carvedilol (COREG) 12.5 MG tablet TAKE 1 TABLET BY MOUTH TWICE DAILY 180 tablet 1  . fish oil-omega-3 fatty acids 1000 MG capsule Take 1 g by mouth daily.     . Glucosamine-Chondroit-Vit C-Mn  (GLUCOSAMINE 1500 COMPLEX PO) Take 1 tablet by mouth daily.    Derrill Memo ON 03/18/2020] lisdexamfetamine (VYVANSE) 70 MG capsule Take 1 capsule (70 mg total) by mouth daily. 30 capsule 0  . [START ON 04/15/2020] lisdexamfetamine (VYVANSE) 70 MG capsule Take 1 capsule (70 mg total) by mouth daily. 30 capsule 0  . MAGNESIUM OXIDE PO Take by mouth daily. Helps with sleep    . Multiple Vitamin (MULTIVITAMIN WITH MINERALS) TABS tablet Take 1 tablet by mouth daily.    . sacubitril-valsartan (ENTRESTO) 24-26 MG Take 1 tablet by mouth 2 (two) times daily. 180 tablet 1  . sildenafil (VIAGRA) 50 MG tablet TAKE 1 TABLET BY MOUTH ONCE DAILY AS NEEDED FOR ERECTILE DYSFUNCTION 6 tablet 2  . sotalol (BETAPACE) 80 MG tablet TAKE 1 TABLET BY MOUTH EVERY 12 HOURS 180 tablet 1  . spironolactone (ALDACTONE) 25 MG tablet TAKE 1 TABLET BY MOUTH ONCE DAILY 30 tablet 6  . Turmeric 500 MG CAPS Take 1,500 mg by mouth daily.     Marland Kitchen UNABLE TO FIND Take 2 tablets by mouth daily. Lions mane mushroom extract    . amphetamine-dextroamphetamine (ADDERALL) 10 MG tablet Take 1 tablet (10 mg total) by mouth daily in the afternoon. 30 tablet 0  . [START ON 03/18/2020] amphetamine-dextroamphetamine (ADDERALL) 10 MG tablet Take 1 tablet (10 mg total) by mouth daily in the afternoon. 30 tablet 0  . Coenzyme Q10 (CO Q 10 PO) Take 1 tablet by mouth daily. (Patient not taking: Reported on 02/19/2020)    . HYDROcodone-acetaminophen (NORCO/VICODIN) 5-325 MG tablet Take 1 tablet by mouth every 8 (eight) hours as needed. (Patient not taking: Reported on 02/19/2020) 15 tablet 0  . lisdexamfetamine (VYVANSE) 70 MG capsule Take 1 capsule (70 mg total) by mouth daily. 30 capsule 0  . predniSONE (RAYOS) 5 MG TBEC Take 5 mg by mouth at bedtime. (Patient not taking: Reported on 02/19/2020) 14 tablet 1   No current facility-administered medications for this visit.    Medication Side Effects: None  Allergies: No Known Allergies  Past Medical History:   Diagnosis Date  . ADHD (attention deficit hyperactivity disorder)   . AICD (automatic cardioverter/defibrillator) present 06/30/2014   BiV ICD Insertion CRT-D  . Asthma   . ETOH abuse   . Heart murmur   . Nonischemic cardiomyopathy ([place])   . PAF (paroxysmal atrial fibrillation) ([place])   . Pneumonia ~ 01/2014  . Syncope    episode occured Feb 2011, ICD placed  . Systolic congestive heart failure ([place])    With ejection fraction of 25%, normal coronaries on Cath 02/2010  . 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([place]) 1998   "right"  . Ventricular tachycardia ([facility])     Family History  Problem Relation Age of Onset  . Heart disease Mother        mother and sister with valve problems  . Pancreatic cancer Maternal Grandfather   . Cardiomyopathy Neg Hx     Social History   Socioeconomic History  . Marital status: Divorced    Spouse name: Not on file  . Number of children: Not on file  . Years of  education: Not on file  . Highest education level: Not on file  Occupational History  . Occupation: Part time Programme researcher, broadcasting/film/video  Tobacco Use  . Smoking status: Passive Smoke Exposure - Never Smoker  . Smokeless tobacco: Never Used  . 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 Vaping Use: Never used  Substance and Sexual Activity  . Alcohol use: Yes    Alcohol/week: 28.0 standard drinks    Types: 7 Glasses of wine, 21 Cans of beer per week    Comment: 06/30/2014 "2-6 beer2 or glasses of wine/day; probably 2/3 of these beers"  . Drug use: No  . Sexual activity: Not Currently  Other Topics Concern  . Not on file  Social History Narrative  . Not on file   Social Determinants of Health   Financial Resource Strain: Not on file  Food Insecurity: Not on file  Transportation Needs: Not on file  Physical Activity: Not on file  Stress: Not on file  Social Connections: Not on file  Intimate Partner Violence: Not on file    Past Medical History,  Surgical history, Social history, and Family history were reviewed and updated as appropriate.   Please see review of systems for further details on the patient's review from today.   Objective:   Physical Exam:  There were no vitals taken for this visit.  Physical Exam Constitutional:      General: He is not in acute distress.    Appearance: He is well-developed.  Musculoskeletal:        General: No deformity.  Neurological:     Mental Status: He is alert and oriented to person, place, and time.     Motor: No tremor.     Coordination: Coordination normal.     Gait: Gait normal.  Psychiatric:        Attention and Perception: Attention normal. He is attentive. He does not perceive auditory hallucinations.        Mood and Affect: Mood normal. Mood is not anxious or depressed. Affect is not labile, blunt, angry or inappropriate.        Speech: Speech normal. Speech is not slurred.        Behavior: Behavior normal.        Thought Content: Thought content normal. Thought content does not include homicidal or suicidal ideation. Thought content does not include homicidal or suicidal plan.        Cognition and Memory: Cognition normal.        Judgment: Judgment normal.     Comments: Insight is good. Occ stress with food.       Lab Review:     Component Value Date/Time   NA 139 01/29/2020 1302   K 5.0 01/29/2020 1302   CL 101 01/29/2020 1302   CO2 25 01/29/2020 1302   GLUCOSE 103 (H) 01/29/2020 1302   GLUCOSE 90 03/02/2018 1127   BUN 12 01/29/2020 1302   CREATININE 0.92 01/29/2020 1302   CREATININE 1.17 07/08/2015 0835   CALCIUM 9.3 01/29/2020 1302   PROT 6.5 07/25/2019 1243   ALBUMIN 4.4 07/25/2019 1243   AST 20 07/25/2019 1243   ALT 27 07/25/2019 1243   ALKPHOS 41 (L) 07/25/2019 1243   BILITOT 0.4 07/25/2019 1243   GFRNONAA 97 01/29/2020 1302   GFRAA 112 01/29/2020 1302       Component Value Date/Time   WBC 6.7 08/14/2018 1521   WBC  7.6 11/25/2015 0102   RBC 4.21  08/14/2018 1521   RBC 4.34 11/25/2015 0102   HGB 13.6 08/14/2018 1521   HCT 41.1 08/14/2018 1521   PLT 178 08/14/2018 1521   MCV 98 (H) 08/14/2018 1521   MCH 32.3 08/14/2018 1521   MCH 32.9 11/25/2015 0102   MCHC 33.1 08/14/2018 1521   MCHC 33.8 11/25/2015 0102   RDW 12.3 08/14/2018 1521   LYMPHSABS 1.2 06/23/2014 1139   MONOABS 0.4 06/23/2014 1139   EOSABS 0.4 06/23/2014 1139   BASOSABS 0.0 06/23/2014 1139    No results found for: POCLITH, LITHIUM   No results found for: PHENYTOIN, PHENOBARB, VALPROATE, CBMZ   .res Assessment: Plan:    [place] was seen today for follow-up, major depressive disorder with single episode, in full remi and attention deficit hyperactivity disorder (adhd), predominan.  Diagnoses and all orders for this visit:  Major depressive disorder with single episode, in full remission ([place])  Attention deficit hyperactivity disorder (ADHD), predominantly inattentive type -     lisdexamfetamine (VYVANSE) 70 MG capsule; Take 1 capsule (70 mg total) by mouth daily. -     lisdexamfetamine (VYVANSE) 70 MG capsule; Take 1 capsule (70 mg total) by mouth daily. -     lisdexamfetamine (VYVANSE) 70 MG capsule; Take 1 capsule (70 mg total) by mouth daily. -     amphetamine-dextroamphetamine (ADDERALL) 10 MG tablet; Take 1 tablet (10 mg total) by mouth daily in the afternoon. -     amphetamine-dextroamphetamine (ADDERALL) 10 MG tablet; Take 1 tablet (10 mg total) by mouth daily in the afternoon.  Anxiety manageable without meds.  Satisfied with Vyvanse and prn Adderall.  Continue Vyvanse 70 mg AM and occ Adderall 10 mg each pm. Option switch to [place].  Disc differences. He wants to continue what he's taking.  No change indicated.  But disc the alternative types of stimulants and durations and SE. Discussed potential benefits, risks, and side effects of stimulants with patient to include increased heart rate, palpitations, insomnia, increased anxiety, increased  irritability, or decreased appetite.  Instructed patient to contact office if experiencing any significant tolerability issues.  He's paying attention to the cardiac risk.  Sleep is better and using techniques help.  Wrestled with insomnia all his life. Sleep hygiene and ideal temps.  Good sleep tends to cycle.  Supportive therapy on job seeking.  Working on this.  Has a plan.    FU 6 mos  Lynder Parents, MD, DFAPA   Please see After Visit Summary for patient specific instructions.  Future Appointments  Date Time Provider [place]  03/16/2020  7:00 AM [facility] DEVICE REMOTES [facility]  06/15/2020  7:00 AM [facility] DEVICE REMOTES [facility]  09/14/2020  7:00 AM [facility] DEVICE REMOTES [facility]  12/14/2020  7:00 AM [facility] DEVICE REMOTES [facility]  03/15/2021  7:00 AM [facility] DEVICE REMOTES [facility]  06/14/2021  7:00 AM [facility] DEVICE REMOTES [facility]    No orders of the defined types were placed in this encounter.     -------------------------------

## 2020-02-21 ENCOUNTER — Other Ambulatory Visit (HOSPITAL_COMMUNITY): Payer: Commercial Managed Care - PPO

## 2020-03-05 ENCOUNTER — Telehealth: Payer: Self-pay | Admitting: Psychiatry

## 2020-03-05 NOTE — Telephone Encounter (Signed)
Pt called and said that he has new insurance and that his vyvanse is not the preffered drug. He would have to pay 300 dollars for the medicine So he wants to take adderall xr again because insurance will cover that. He has taken that in the past or you can suggest something else that would be covered. He has not been seen since august of last year. Please call him at [phone]

## 2020-03-05 NOTE — Telephone Encounter (Signed)
Please review  His Vyvanse would probably need a Prior Authorization but not sure

## 2020-03-06 ENCOUNTER — Other Ambulatory Visit: Payer: Self-pay | Admitting: Psychiatry

## 2020-03-06 MED ORDER — AMPHETAMINE-DEXTROAMPHET ER 30 MG PO CP24: 30.0000 mg | ORAL_CAPSULE | Freq: Every day | ORAL | 0 refills | Status: DC

## 2020-03-06 MED FILL — AMPHETAMINE-DEXTROAMPHET ER: 30 | 30 days supply | Qty: 30 | Fill #0

## 2020-03-16 ENCOUNTER — Ambulatory Visit (INDEPENDENT_AMBULATORY_CARE_PROVIDER_SITE_OTHER): Payer: Commercial Managed Care - PPO

## 2020-03-16 DIAGNOSIS — I472 Ventricular tachycardia, unspecified: Secondary | ICD-10-CM

## 2020-03-18 LAB — CUP PACEART REMOTE DEVICE CHECK
Battery Remaining Longevity: 27 mo
Battery Voltage: 2.94 V
Brady Statistic AP VP Percent: 0.31 %
Brady Statistic AP VS Percent: 0.04 %
Brady Statistic AS VP Percent: 98.18 %
Brady Statistic AS VS Percent: 1.47 %
Brady Statistic RA Percent Paced: 0.35 %
Brady Statistic RV Percent Paced: 1.25 %
Date Time Interrogation Session: 20220314044223
HighPow Impedance: 45 Ohm
HighPow Impedance: 57 Ohm
Implantable Lead Implant Date: 20120208
Implantable Lead Implant Date: 20160627
Implantable Lead Implant Date: 20160627
Implantable Lead Location: 753858
Implantable Lead Location: 753860
Implantable Lead Location: 753860
Implantable Lead Model: 185
Implantable Lead Model: 4598
Implantable Lead Model: 5076
Implantable Lead Serial Number: 345409
Implantable Pulse Generator Implant Date: 20160627
Lead Channel Impedance Value: 361 Ohm
Lead Channel Impedance Value: 361 Ohm
Lead Channel Impedance Value: 399 Ohm
Lead Channel Impedance Value: 399 Ohm
Lead Channel Impedance Value: 418 Ohm
Lead Channel Impedance Value: 418 Ohm
Lead Channel Impedance Value: 513 Ohm
Lead Channel Impedance Value: 589 Ohm
Lead Channel Impedance Value: 646 Ohm
Lead Channel Impedance Value: 646 Ohm
Lead Channel Impedance Value: 836 Ohm
Lead Channel Impedance Value: 874 Ohm
Lead Channel Impedance Value: 893 Ohm
Lead Channel Pacing Threshold Amplitude: 0.5 V
Lead Channel Pacing Threshold Amplitude: 0.5 V
Lead Channel Pacing Threshold Amplitude: 1.875 V
Lead Channel Pacing Threshold Pulse Width: 0.4 ms
Lead Channel Pacing Threshold Pulse Width: 0.4 ms
Lead Channel Pacing Threshold Pulse Width: 0.4 ms
Lead Channel Sensing Intrinsic Amplitude: 0.875 mV
Lead Channel Sensing Intrinsic Amplitude: 0.875 mV
Lead Channel Sensing Intrinsic Amplitude: 8.125 mV
Lead Channel Sensing Intrinsic Amplitude: 8.125 mV
Lead Channel Setting Pacing Amplitude: 1.25 V
Lead Channel Setting Pacing Amplitude: 1.5 V
Lead Channel Setting Pacing Amplitude: 2 V
Lead Channel Setting Pacing Pulse Width: 0.4 ms
Lead Channel Setting Pacing Pulse Width: 0.8 ms
Lead Channel Setting Sensing Sensitivity: 0.45 mV

## 2020-03-24 NOTE — Progress Notes (Signed)
Remote ICD transmission.   

## 2020-04-14 ENCOUNTER — Other Ambulatory Visit (HOSPITAL_COMMUNITY): Payer: Self-pay

## 2020-04-14 ENCOUNTER — Other Ambulatory Visit: Payer: Self-pay | Admitting: Psychiatry

## 2020-04-14 DIAGNOSIS — F900 Attention-deficit hyperactivity disorder, predominantly inattentive type: Secondary | ICD-10-CM

## 2020-04-14 MED ORDER — AMPHETAMINE-DEXTROAMPHETAMINE 10 MG PO TABS
ORAL_TABLET | ORAL | 0 refills | Status: DC
  Filled 2020-04-14: qty 30, 30d supply, fill #0

## 2020-04-14 MED ORDER — AMPHETAMINE-DEXTROAMPHETAMINE 10 MG PO TABS
10.0000 mg | ORAL_TABLET | Freq: Every day | ORAL | 0 refills | Status: DC
  Filled 2020-06-24: qty 30, 30d supply, fill #0

## 2020-04-14 MED ORDER — AMPHETAMINE-DEXTROAMPHETAMINE 10 MG PO TABS
ORAL_TABLET | ORAL | 0 refills | Status: DC
  Filled 2020-07-21: qty 30, 30d supply, fill #0

## 2020-04-14 MED ORDER — AMPHETAMINE-DEXTROAMPHET ER 30 MG PO CP24
30.0000 mg | ORAL_CAPSULE | Freq: Every day | ORAL | 0 refills | Status: DC
  Filled 2020-05-15: qty 30, 30d supply, fill #0
  Filled ????-??-??: fill #0

## 2020-04-14 MED ORDER — AMPHETAMINE-DEXTROAMPHET ER 30 MG PO CP24
ORAL_CAPSULE | Freq: Every day | ORAL | 0 refills | Status: DC
  Filled 2020-04-14: qty 30, 30d supply, fill #0

## 2020-04-14 MED ORDER — AMPHETAMINE-DEXTROAMPHET ER 30 MG PO CP24
30.0000 mg | ORAL_CAPSULE | Freq: Every day | ORAL | 0 refills | Status: DC
  Filled 2020-06-18: qty 30, 30d supply, fill #0

## 2020-04-14 NOTE — Telephone Encounter (Signed)
sent 

## 2020-04-14 NOTE — Telephone Encounter (Signed)
Controlled substance 

## 2020-04-16 ENCOUNTER — Other Ambulatory Visit (HOSPITAL_COMMUNITY): Payer: Self-pay

## 2020-04-17 ENCOUNTER — Other Ambulatory Visit (HOSPITAL_BASED_OUTPATIENT_CLINIC_OR_DEPARTMENT_OTHER): Payer: Self-pay

## 2020-05-11 ENCOUNTER — Other Ambulatory Visit (HOSPITAL_COMMUNITY): Payer: Self-pay

## 2020-05-11 MED FILL — Sacubitril-Valsartan Tab 24-26 MG: ORAL | 30 days supply | Qty: 60 | Fill #0 | Status: AC

## 2020-05-12 ENCOUNTER — Other Ambulatory Visit (HOSPITAL_COMMUNITY): Payer: Self-pay

## 2020-05-13 ENCOUNTER — Other Ambulatory Visit (HOSPITAL_COMMUNITY): Payer: Self-pay

## 2020-05-14 ENCOUNTER — Other Ambulatory Visit (HOSPITAL_COMMUNITY): Payer: Self-pay

## 2020-05-15 ENCOUNTER — Other Ambulatory Visit (HOSPITAL_COMMUNITY): Payer: Self-pay

## 2020-05-18 ENCOUNTER — Other Ambulatory Visit (HOSPITAL_COMMUNITY): Payer: Self-pay

## 2020-05-19 ENCOUNTER — Other Ambulatory Visit (HOSPITAL_COMMUNITY): Payer: Self-pay

## 2020-05-19 ENCOUNTER — Telehealth: Payer: Self-pay

## 2020-05-19 NOTE — Telephone Encounter (Signed)
Prior authorization request for pt's ADDERALL XR 30 MG #30. Contacted  [employer] [place] [phone] p to complete over the phone [phone] f Clinical questions answered but there are requirements that have to be met for a pt over the age of 21. They request his records be sent over for review.  Pt can not be on a benzodiazepine Pt can not be on 2 stimulants at the same time, such as a short acting and long acting. Pt is required to have tried the formularies.  F90.0 ADHD  ID# [id number]  representative instructed to use fax # [phone] and add Ref # [id number] when faxing records.    Will fax office notes for review   

## 2020-05-20 ENCOUNTER — Other Ambulatory Visit (HOSPITAL_COMMUNITY): Payer: Self-pay

## 2020-05-21 NOTE — Telephone Encounter (Signed)
Prior Approval received  05/19/2020-05/19/2021 for AMPHETAMINE-DEXTROAMPHETAMINE ER 30 MG #30 with [employer] [facility]  [phone]

## 2020-06-03 ENCOUNTER — Other Ambulatory Visit (HOSPITAL_COMMUNITY): Payer: Self-pay

## 2020-06-03 ENCOUNTER — Other Ambulatory Visit: Payer: Self-pay | Admitting: Family Medicine

## 2020-06-03 MED FILL — Sotalol HCl Tab 80 MG: ORAL | 30 days supply | Qty: 60 | Fill #0 | Status: AC

## 2020-06-03 MED FILL — Carvedilol Tab 12.5 MG: ORAL | 30 days supply | Qty: 60 | Fill #0 | Status: AC

## 2020-06-04 ENCOUNTER — Other Ambulatory Visit (HOSPITAL_COMMUNITY): Payer: Self-pay

## 2020-06-05 ENCOUNTER — Other Ambulatory Visit: Payer: Self-pay | Admitting: Family Medicine

## 2020-06-05 ENCOUNTER — Encounter: Payer: Self-pay | Admitting: Family Medicine

## 2020-06-05 ENCOUNTER — Other Ambulatory Visit (HOSPITAL_COMMUNITY): Payer: Self-pay

## 2020-06-05 MED ORDER — ALLOPURINOL 100 MG PO TABS
100.0000 mg | ORAL_TABLET | Freq: Every day | ORAL | 1 refills | Status: DC
  Filled 2020-06-05: qty 30, 30d supply, fill #0
  Filled 2020-07-03: qty 30, 30d supply, fill #1
  Filled 2020-08-12: qty 30, 30d supply, fill #2
  Filled 2020-09-17: qty 30, 30d supply, fill #3
  Filled 2020-11-13: qty 30, 30d supply, fill #4
  Filled 2020-12-18: qty 30, 30d supply, fill #5

## 2020-06-06 ENCOUNTER — Other Ambulatory Visit (HOSPITAL_COMMUNITY): Payer: Self-pay

## 2020-06-18 ENCOUNTER — Other Ambulatory Visit (HOSPITAL_COMMUNITY): Payer: Self-pay

## 2020-06-20 ENCOUNTER — Other Ambulatory Visit (HOSPITAL_COMMUNITY): Payer: Self-pay

## 2020-06-24 ENCOUNTER — Other Ambulatory Visit (HOSPITAL_COMMUNITY): Payer: Self-pay

## 2020-06-24 MED FILL — Sildenafil Citrate Tab 50 MG: ORAL | 30 days supply | Qty: 6 | Fill #0 | Status: AC

## 2020-06-24 MED FILL — Sacubitril-Valsartan Tab 24-26 MG: ORAL | 30 days supply | Qty: 60 | Fill #1 | Status: AC

## 2020-07-03 ENCOUNTER — Other Ambulatory Visit (HOSPITAL_COMMUNITY): Payer: Self-pay

## 2020-07-04 ENCOUNTER — Other Ambulatory Visit (HOSPITAL_COMMUNITY): Payer: Self-pay

## 2020-07-17 ENCOUNTER — Other Ambulatory Visit (HOSPITAL_COMMUNITY): Payer: Self-pay

## 2020-07-17 ENCOUNTER — Other Ambulatory Visit: Payer: Self-pay | Admitting: Psychiatry

## 2020-07-17 NOTE — Telephone Encounter (Signed)
Last filled 6/18 appt on 8/17

## 2020-07-18 MED ORDER — AMPHETAMINE-DEXTROAMPHET ER 30 MG PO CP24
30.0000 mg | ORAL_CAPSULE | Freq: Every day | ORAL | 0 refills | Status: DC
  Filled 2020-07-18: qty 30, 30d supply, fill #0

## 2020-07-20 ENCOUNTER — Other Ambulatory Visit (HOSPITAL_COMMUNITY): Payer: Self-pay

## 2020-07-20 MED FILL — Carvedilol Tab 12.5 MG: ORAL | 30 days supply | Qty: 60 | Fill #1 | Status: AC

## 2020-07-20 MED FILL — Sotalol HCl Tab 80 MG: ORAL | 30 days supply | Qty: 60 | Fill #1 | Status: AC

## 2020-07-21 ENCOUNTER — Other Ambulatory Visit (HOSPITAL_COMMUNITY): Payer: Self-pay

## 2020-07-29 ENCOUNTER — Other Ambulatory Visit (HOSPITAL_COMMUNITY): Payer: Self-pay

## 2020-07-29 MED FILL — Sacubitril-Valsartan Tab 24-26 MG: ORAL | 30 days supply | Qty: 60 | Fill #2 | Status: AC

## 2020-08-12 ENCOUNTER — Other Ambulatory Visit (HOSPITAL_COMMUNITY): Payer: Self-pay

## 2020-08-18 ENCOUNTER — Other Ambulatory Visit: Payer: Self-pay | Admitting: Psychiatry

## 2020-08-18 ENCOUNTER — Other Ambulatory Visit (HOSPITAL_COMMUNITY): Payer: Self-pay

## 2020-08-18 DIAGNOSIS — F900 Attention-deficit hyperactivity disorder, predominantly inattentive type: Secondary | ICD-10-CM

## 2020-08-18 MED ORDER — AMPHETAMINE-DEXTROAMPHETAMINE 10 MG PO TABS
10.0000 mg | ORAL_TABLET | Freq: Every day | ORAL | 0 refills | Status: DC
  Filled 2020-08-18: qty 30, 30d supply, fill #0

## 2020-08-19 ENCOUNTER — Other Ambulatory Visit (HOSPITAL_COMMUNITY): Payer: Self-pay

## 2020-08-19 ENCOUNTER — Ambulatory Visit: Payer: Commercial Managed Care - PPO | Admitting: Psychiatry

## 2020-08-19 ENCOUNTER — Other Ambulatory Visit: Payer: Self-pay | Admitting: Psychiatry

## 2020-08-20 ENCOUNTER — Other Ambulatory Visit (HOSPITAL_COMMUNITY): Payer: Self-pay

## 2020-08-20 NOTE — Telephone Encounter (Signed)
Refuse duplicate

## 2020-08-21 ENCOUNTER — Other Ambulatory Visit: Payer: Self-pay | Admitting: Psychiatry

## 2020-08-21 ENCOUNTER — Telehealth: Payer: Self-pay | Admitting: Psychiatry

## 2020-08-21 ENCOUNTER — Other Ambulatory Visit: Payer: Self-pay

## 2020-08-21 ENCOUNTER — Other Ambulatory Visit (HOSPITAL_COMMUNITY): Payer: Self-pay

## 2020-08-21 DIAGNOSIS — F900 Attention-deficit hyperactivity disorder, predominantly inattentive type: Secondary | ICD-10-CM

## 2020-08-21 MED ORDER — AMPHETAMINE-DEXTROAMPHET ER 30 MG PO CP24
30.0000 mg | ORAL_CAPSULE | Freq: Every day | ORAL | 0 refills | Status: DC
  Filled 2020-09-18: qty 30, 30d supply, fill #0

## 2020-08-21 MED ORDER — AMPHETAMINE-DEXTROAMPHET ER 30 MG PO CP24
30.0000 mg | ORAL_CAPSULE | Freq: Every day | ORAL | 0 refills | Status: DC
  Filled 2020-08-21: qty 30, 30d supply, fill #0

## 2020-08-21 MED ORDER — AMPHETAMINE-DEXTROAMPHETAMINE 10 MG PO TABS: ORAL_TABLET | ORAL | 0 refills | Status: DC

## 2020-08-21 MED ORDER — AMPHETAMINE-DEXTROAMPHETAMINE 10 MG PO TABS
ORAL_TABLET | ORAL | 0 refills | Status: DC
Start: 2020-08-21 — End: 2020-11-16
  Filled 2020-08-21: qty 30, fill #0

## 2020-08-21 NOTE — Telephone Encounter (Signed)
Pt left message requesting Adderall Rx. Has apt 10/24 and Rx was denied. Contact Pt @ [phone].

## 2020-08-21 NOTE — Telephone Encounter (Signed)
Pended,this was my mistake

## 2020-09-03 ENCOUNTER — Other Ambulatory Visit (HOSPITAL_COMMUNITY): Payer: Self-pay

## 2020-09-03 ENCOUNTER — Other Ambulatory Visit: Payer: Self-pay | Admitting: Internal Medicine

## 2020-09-03 MED FILL — Carvedilol Tab 12.5 MG: ORAL | 30 days supply | Qty: 60 | Fill #2 | Status: AC

## 2020-09-03 MED FILL — Sotalol HCl Tab 80 MG: ORAL | 30 days supply | Qty: 60 | Fill #2 | Status: AC

## 2020-09-04 ENCOUNTER — Other Ambulatory Visit (HOSPITAL_COMMUNITY): Payer: Self-pay

## 2020-09-04 MED ORDER — ENTRESTO 24-26 MG PO TABS
1.0000 | ORAL_TABLET | Freq: Two times a day (BID) | ORAL | 1 refills | Status: DC
  Filled 2020-09-04: qty 60, 30d supply, fill #0
  Filled 2020-10-07: qty 60, 30d supply, fill #1
  Filled 2020-11-13: qty 60, 30d supply, fill #2
  Filled 2020-12-18: qty 60, 30d supply, fill #3
  Filled 2021-01-19: qty 60, 30d supply, fill #4
  Filled 2021-03-15: qty 60, 30d supply, fill #5

## 2020-09-17 ENCOUNTER — Other Ambulatory Visit (HOSPITAL_COMMUNITY): Payer: Self-pay

## 2020-09-17 ENCOUNTER — Other Ambulatory Visit (HOSPITAL_COMMUNITY): Payer: Self-pay | Admitting: Cardiology

## 2020-09-17 MED ORDER — SILDENAFIL CITRATE 50 MG PO TABS
50.0000 mg | ORAL_TABLET | Freq: Every day | ORAL | 2 refills | Status: DC | PRN
  Filled 2020-09-17: qty 6, 6d supply, fill #0
  Filled 2020-11-13: qty 6, 6d supply, fill #1
  Filled 2020-12-18: qty 6, 6d supply, fill #2

## 2020-09-18 ENCOUNTER — Other Ambulatory Visit (HOSPITAL_COMMUNITY): Payer: Self-pay

## 2020-10-07 ENCOUNTER — Other Ambulatory Visit: Payer: Self-pay | Admitting: Internal Medicine

## 2020-10-07 ENCOUNTER — Other Ambulatory Visit (HOSPITAL_COMMUNITY): Payer: Self-pay

## 2020-10-07 MED ORDER — SOTALOL HCL 80 MG PO TABS
80.0000 mg | ORAL_TABLET | Freq: Two times a day (BID) | ORAL | 0 refills | Status: DC
  Filled 2020-10-07: qty 60, 30d supply, fill #0
  Filled 2020-11-13: qty 60, 30d supply, fill #1
  Filled 2020-12-18: qty 60, 30d supply, fill #2

## 2020-10-07 MED ORDER — CARVEDILOL 12.5 MG PO TABS
12.5000 mg | ORAL_TABLET | Freq: Two times a day (BID) | ORAL | 0 refills | Status: DC
  Filled 2020-10-07: qty 60, 30d supply, fill #0
  Filled 2020-11-13: qty 60, 30d supply, fill #1
  Filled 2020-12-18: qty 60, 30d supply, fill #2

## 2020-10-12 ENCOUNTER — Other Ambulatory Visit: Payer: Self-pay | Admitting: Psychiatry

## 2020-10-12 ENCOUNTER — Other Ambulatory Visit (HOSPITAL_COMMUNITY): Payer: Self-pay

## 2020-10-12 ENCOUNTER — Telehealth: Payer: Self-pay | Admitting: Psychiatry

## 2020-10-12 MED ORDER — LISDEXAMFETAMINE DIMESYLATE 70 MG PO CAPS
70.0000 mg | ORAL_CAPSULE | Freq: Every day | ORAL | 0 refills | Status: DC
  Filled 2020-10-12: qty 30, 30d supply, fill #0

## 2020-10-12 NOTE — Telephone Encounter (Signed)
Please send

## 2020-10-12 NOTE — Telephone Encounter (Signed)
Sent vyvanse 70

## 2020-10-12 NOTE — Telephone Encounter (Signed)
[name] has been approved for Pt. Assistance for his Vyvanse.  He has been approved through 10/09/21.  He will receive a pharmacy card to use at his pharmacy when picking up his medication.  He will need to have prescriptions at the pharmacy for him to use the card.  Send to [facility].  Please chart this pt. Assistance.

## 2020-10-13 ENCOUNTER — Other Ambulatory Visit (HOSPITAL_COMMUNITY): Payer: Self-pay

## 2020-10-13 ENCOUNTER — Telehealth: Payer: Self-pay

## 2020-10-13 NOTE — Telephone Encounter (Signed)
Received a prior authorization request from [facility] for pt's Vyvanse but it appears he has been approved for Pt. Assistance. Will notify Pharmacy.

## 2020-10-15 ENCOUNTER — Other Ambulatory Visit (HOSPITAL_COMMUNITY): Payer: Self-pay

## 2020-10-17 ENCOUNTER — Other Ambulatory Visit (HOSPITAL_COMMUNITY): Payer: Self-pay

## 2020-10-19 ENCOUNTER — Other Ambulatory Visit (HOSPITAL_COMMUNITY): Payer: Self-pay

## 2020-10-22 ENCOUNTER — Other Ambulatory Visit (HOSPITAL_COMMUNITY): Payer: Self-pay

## 2020-10-26 ENCOUNTER — Ambulatory Visit: Payer: Self-pay | Admitting: Psychiatry

## 2020-11-13 ENCOUNTER — Other Ambulatory Visit (HOSPITAL_COMMUNITY): Payer: Self-pay

## 2020-11-16 ENCOUNTER — Ambulatory Visit (INDEPENDENT_AMBULATORY_CARE_PROVIDER_SITE_OTHER): Payer: Self-pay | Admitting: Psychiatry

## 2020-11-16 ENCOUNTER — Encounter: Payer: Self-pay | Admitting: Psychiatry

## 2020-11-16 ENCOUNTER — Other Ambulatory Visit (HOSPITAL_COMMUNITY): Payer: Self-pay

## 2020-11-16 ENCOUNTER — Other Ambulatory Visit: Payer: Self-pay

## 2020-11-16 VITALS — BP 92/58 | HR 69

## 2020-11-16 DIAGNOSIS — F900 Attention-deficit hyperactivity disorder, predominantly inattentive type: Secondary | ICD-10-CM

## 2020-11-16 DIAGNOSIS — F325 Major depressive disorder, single episode, in full remission: Secondary | ICD-10-CM

## 2020-11-16 DIAGNOSIS — G4726 Circadian rhythm sleep disorder, shift work type: Secondary | ICD-10-CM

## 2020-11-16 MED ORDER — AMPHETAMINE-DEXTROAMPHETAMINE 10 MG PO TABS
10.0000 mg | ORAL_TABLET | Freq: Every day | ORAL | 0 refills | Status: DC
  Filled 2021-02-15: qty 30, 30d supply, fill #0

## 2020-11-16 MED ORDER — AMPHETAMINE-DEXTROAMPHETAMINE 10 MG PO TABS
ORAL_TABLET | ORAL | 0 refills | Status: DC
  Filled 2020-11-16: qty 30, 30d supply, fill #0

## 2020-11-16 MED ORDER — AMPHETAMINE-DEXTROAMPHET ER 30 MG PO CP24: ORAL_CAPSULE | Freq: Every day | ORAL | 0 refills | Status: DC

## 2020-11-16 MED ORDER — LISDEXAMFETAMINE DIMESYLATE 70 MG PO CAPS: 70.0000 mg | ORAL_CAPSULE | Freq: Every day | ORAL | 0 refills | Status: DC

## 2020-11-16 MED ORDER — LISDEXAMFETAMINE DIMESYLATE 70 MG PO CAPS
70.0000 mg | ORAL_CAPSULE | Freq: Every day | ORAL | 0 refills | Status: DC
  Filled 2021-01-19: qty 30, 30d supply, fill #0

## 2020-11-16 MED ORDER — LISDEXAMFETAMINE DIMESYLATE 70 MG PO CAPS
70.0000 mg | ORAL_CAPSULE | Freq: Every day | ORAL | 0 refills | Status: DC
  Filled 2020-11-16 – 2020-11-20 (×2): qty 30, 30d supply, fill #0

## 2020-11-16 NOTE — Progress Notes (Signed)
DRAVON NOTT [id number] 1970/10/26 50 y.o.  Subjective:   Patient ID:  [name] is a 50 y.o. (DOB Nov 30, 1970) male.  Chief Complaint:  Chief Complaint  Patient presents with   Follow-up   Depression   ADHD    HPI [name] presents to the office today for follow-up of ADD and history of shift work sleep disorder..  +visit 02/18/19:.  No meds were changed.Continue Vyvanse 70 mg AM and occ Adderall 10 mg each pm.  08/21/19 appt with the following noted: Stressful but overall good since here.  No card problems since here.   Still doing woodworking.  Redecorated his living room at home.    Still on Vyvanse 70 and prn Adderall.  Some split double shifts. No SE.  Patient is under the care of cardiology.  His diagnoses include nonischemic cardiomyopathy, systolic congestive heart failure which is chronic, history of ventricular tachycardia, history of SVT.  He has a implantable defibrillator and pacemaker.  Pacemaker helped.   Cardiology had no concerns regarding stimulant use.  Career change decision at the beginning of the year and then Covid hit.  Had to return to [employer].  Has built and made things during Covid and other projects.  Enjoys making things.  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  Been productive.  Daughter has stayed with him since Covid a lot.  50yo.   Satisfied with ADD treatment with Vyvanse and occ immediate use Adderall 10 mg on occasion.  Needs this esp on long days.  Had trouble with the holidays with schedule and what to do.  This has prompted him to consider another vocation.  Comfortable rut for 6 years.  Needs to change.  Med wise the schedule has been more erratic requiring short-acting stimulant. Plan: no med changes.  02/19/2020 appointment with the following noted: Tumultuous several months with good and bad.   Health overall OK except AFIB about a month ago.  Previous was a year ago.  Never needed  treatment.  Thinks maybe stress triggered it. Has GF.  Supportive. On the whole anxiety is manageable but a lot of health stress issues this last year.  Ortho injuries Also has gout onset and changed diet. Changing jobs..   Plan continue Vyvanse  11/16/2020 appointment with the following noted: GF is NP.  Got pt assistance recently and able to get Vyvanse .  Adderall is uneven and more intrusive. Cardiac ouput is better.    Patient reports stable mood and denies depressed or irritable moods.  Patient denies any recent difficulty with anxiety except as usual.  Patient denies difficulty with sleep initiation or maintenance and is better. Denies appetite disturbance.  Patient reports that energy and motivation have been good.  Patient denies any difficulty with concentration.  Patient denies any suicidal ideation.  Past Psychiatric Medication Trials: Vyvanse 70, Concerta, Adderall, Adderall XR, Daytrana,  Wellbutrin 300,  clonazepam Patient in this practice since 2004 treated for ADD  Review of Systems:  Review of Systems  Cardiovascular:  Positive for palpitations.  Neurological:  Negative for dizziness, tremors and weakness.  Psychiatric/Behavioral:  Negative for agitation, behavioral problems, confusion, decreased concentration, dysphoric mood, hallucinations, self-injury, sleep disturbance and suicidal ideas. The patient is not nervous/anxious and is not hyperactive.    Medications: I have reviewed the patient's current medications.  Current Outpatient Medications  Medication Sig Dispense Refill   albuterol (PROVENTIL HFA;VENTOLIN HFA) 108 (90 BASE) MCG/ACT inhaler Inhale 2 puffs  into the lungs 4 (four) times daily as needed for wheezing. 1 Inhaler 5   allopurinol (ZYLOPRIM) 100 MG tablet Take 1 tablet by mouth daily 90 tablet 1   amphetamine-dextroamphetamine (ADDERALL) 10 MG tablet TAKE 1 TABLET BY MOUTH ONCE A DAY IN THE AFTERNON 30 tablet 0   aspirin 81 MG tablet Take 81 mg by mouth  daily.     carvedilol (COREG) 12.5 MG tablet Take 1 tablet (12.5 mg total) by mouth 2 (two) times daily. Please make yearly appt with Dr. Caryl Comes for January 2023 for future refills. Thank you 1st attempt 180 tablet 0   fish oil-omega-3 fatty acids 1000 MG capsule Take 1 g by mouth daily.      Glucosamine-Chondroit-Vit C-Mn (GLUCOSAMINE 1500 COMPLEX PO) Take 1 tablet by mouth daily.     lisdexamfetamine (VYVANSE) 70 MG capsule Take 1 capsule (70 mg total) by mouth daily. 30 capsule 0   MAGNESIUM OXIDE PO Take by mouth daily. Helps with sleep     Multiple Vitamin (MULTIVITAMIN WITH MINERALS) TABS tablet Take 1 tablet by mouth daily.     sacubitril-valsartan (ENTRESTO) 24-26 MG Take 1 tablet by mouth 2 (two) times daily. 180 tablet 1   sildenafil (VIAGRA) 50 MG tablet Take 1 tablet (50 mg total) by mouth daily as needed for erectile dysfunction. Please call for office visit [phone] 6 tablet 2   sotalol (BETAPACE) 80 MG tablet Take 1 tablet (80 mg total) by mouth every 12 (twelve) hours. Please make yearly appt with Dr. Caryl Comes for January 2023 for future refills. Thank you 1st attempt 180 tablet 0   spironolactone (ALDACTONE) 25 MG tablet TAKE 1 TABLET BY MOUTH ONCE DAILY 30 tablet 6   Turmeric 500 MG CAPS Take 1,500 mg by mouth daily.      UNABLE TO FIND Take 2 tablets by mouth daily. Lions mane mushroom extract     [START ON 12/14/2020] amphetamine-dextroamphetamine (ADDERALL XR) 30 MG 24 hr capsule TAKE 1 CAPSULE BY MOUTH ONCE A DAY 30 capsule 0   [START ON 01/11/2021] amphetamine-dextroamphetamine (ADDERALL) 10 MG tablet Take 1 tablet (10 mg total) by mouth daily in the afternoon. 30 tablet 0   amphetamine-dextroamphetamine (ADDERALL) 10 MG tablet TAKE 1 TABLET BY MOUTH ONCE A DAY IN THE AFTERNOON 30 tablet 0   Coenzyme Q10 (CO Q 10 PO) Take 1 tablet by mouth daily. (Patient not taking: No sig reported)     [START ON 01/11/2021] lisdexamfetamine (VYVANSE) 70 MG capsule Take 1 capsule (70 mg total) by  mouth daily. 30 capsule 0   [START ON 12/14/2020] lisdexamfetamine (VYVANSE) 70 MG capsule Take 1 capsule (70 mg total) by mouth daily. 30 capsule 0   No current facility-administered medications for this visit.    Medication Side Effects: None  Allergies: No Known Allergies  Past Medical History:  Diagnosis Date   ADHD (attention deficit hyperactivity disorder)    AICD (automatic cardioverter/defibrillator) present 06/30/2014   BiV ICD Insertion CRT-D   Asthma    ETOH abuse    Heart murmur    Nonischemic cardiomyopathy (HCC)    PAF (paroxysmal atrial fibrillation) ([place])    Pneumonia ~ 01/2014   Syncope    episode occured Feb 2011, ICD placed   Systolic congestive heart failure ([place])    With ejection fraction of 25%, normal coronaries on Cath 02/2010   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[facility]) 1998   "right"   Ventricular tachycardia     Family History  Problem Relation Age of  Onset   Heart disease Mother        mother and sister with valve problems   Pancreatic cancer Maternal Grandfather    Cardiomyopathy Neg Hx     Social History   Socioeconomic History   Marital status: Soil scientist    Spouse name: Not on file   Number of children: Not on file   Years of education: Not on file   Highest education level: Not on file  Occupational History   Occupation: Part time Programme researcher, broadcasting/film/video  Tobacco Use   Smoking status: Never    Passive exposure: Yes   Smokeless tobacco: Never   Tobacco comments:    "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"  Vaping Use   Vaping Use: Never used  Substance and Sexual Activity   Alcohol use: Yes    Alcohol/week: 28.0 standard drinks    Types: 7 Glasses of wine, 21 Cans of beer per week    Comment: 06/30/2014 "2-6 beer2 or glasses of wine/day; probably 2/3 of these beers"   Drug use: No   Sexual activity: Not Currently  Other Topics Concern   Not on file  Social History Narrative   Not on file   Social Determinants of  Health   Financial Resource Strain: Not on file  Food Insecurity: Not on file  Transportation Needs: Not on file  Physical Activity: Not on file  Stress: Not on file  Social Connections: Not on file  Intimate Partner Violence: Not on file    Past Medical History, Surgical history, Social history, and Family history were reviewed and updated as appropriate.   Please see review of systems for further details on the patient's review from today.   Objective:   Physical Exam:  BP (!) 92/58   Pulse 69   Physical Exam Constitutional:      General: He is not in acute distress.    Appearance: He is well-developed.  Musculoskeletal:        General: No deformity.  Neurological:     Mental Status: He is alert and oriented to person, place, and time.     Motor: No tremor.     Coordination: Coordination normal.     Gait: Gait normal.  Psychiatric:        Attention and Perception: Attention normal. He is attentive. He does not perceive auditory hallucinations.        Mood and Affect: Mood normal. Mood is not anxious or depressed. Affect is not labile, blunt, angry or inappropriate.        Speech: Speech normal. Speech is not slurred.        Behavior: Behavior normal.        Thought Content: Thought content normal. Thought content does not include homicidal or suicidal ideation. Thought content does not include homicidal or suicidal plan.        Cognition and Memory: Cognition normal.        Judgment: Judgment normal.     Comments: Insight is good.     Lab Review:     Component Value Date/Time   NA 139 01/29/2020 1302   K 5.0 01/29/2020 1302   CL 101 01/29/2020 1302   CO2 25 01/29/2020 1302   GLUCOSE 103 (H) 01/29/2020 1302   GLUCOSE 90 03/02/2018 1127   BUN 12 01/29/2020 1302   CREATININE 0.92 01/29/2020 1302   CREATININE 1.17 07/08/2015 0835   CALCIUM 9.3 01/29/2020 1302   PROT  6.5 07/25/2019 1243   ALBUMIN 4.4 07/25/2019 1243   AST 20 07/25/2019 1243   ALT 27 07/25/2019  1243   ALKPHOS 41 (L) 07/25/2019 1243   BILITOT 0.4 07/25/2019 1243   GFRNONAA 97 01/29/2020 1302   GFRAA 112 01/29/2020 1302       Component Value Date/Time   WBC 6.7 08/14/2018 1521   WBC 7.6 11/25/2015 0102   RBC 4.21 08/14/2018 1521   RBC 4.34 11/25/2015 0102   HGB 13.6 08/14/2018 1521   HCT 41.1 08/14/2018 1521   PLT 178 08/14/2018 1521   MCV 98 (H) 08/14/2018 1521   MCH 32.3 08/14/2018 1521   MCH 32.9 11/25/2015 0102   MCHC 33.1 08/14/2018 1521   MCHC 33.8 11/25/2015 0102   RDW 12.3 08/14/2018 1521   LYMPHSABS 1.2 06/23/2014 1139   MONOABS 0.4 06/23/2014 1139   EOSABS 0.4 06/23/2014 1139   BASOSABS 0.0 06/23/2014 1139    No results found for: POCLITH, LITHIUM   No results found for: PHENYTOIN, PHENOBARB, VALPROATE, CBMZ   .res Assessment: Plan:    [name] was seen today for follow-up, depression and adhd.  Diagnoses and all orders for this visit:  Major depressive disorder with single episode, in full remission ([place])  Attention deficit hyperactivity disorder (ADHD), predominantly inattentive type -     lisdexamfetamine (VYVANSE) 70 MG capsule; Take 1 capsule (70 mg total) by mouth daily. -     lisdexamfetamine (VYVANSE) 70 MG capsule; Take 1 capsule (70 mg total) by mouth daily. -     lisdexamfetamine (VYVANSE) 70 MG capsule; Take 1 capsule (70 mg total) by mouth daily. -     amphetamine-dextroamphetamine (ADDERALL) 10 MG tablet; Take 1 tablet (10 mg total) by mouth daily in the afternoon. -     amphetamine-dextroamphetamine (ADDERALL XR) 30 MG 24 hr capsule; TAKE 1 CAPSULE BY MOUTH ONCE A DAY -     amphetamine-dextroamphetamine (ADDERALL) 10 MG tablet; TAKE 1 TABLET BY MOUTH ONCE A DAY IN THE AFTERNOON  Shift work sleep disorder Anxiety manageable without meds.  Satisfied with Vyvanse and prn Adderall.  Continue Vyvanse 70 mg AM and occ Adderall 10 mg each pm.  No change indicated.  But disc the alternative types of stimulants and durations and SE.  Discussed potential benefits, risks, and side effects of stimulants with patient to include increased heart rate, palpitations, insomnia, increased anxiety, increased irritability, or decreased appetite.  Instructed patient to contact office if experiencing any significant tolerability issues.  He's paying attention to the cardiac risk.  Sleep is better and using techniques help.  Wrestled with insomnia all his life. Sleep hygiene and ideal temps.  Good sleep tends to cycle.  Supportive therapy on job seeking.  Working on this.  Has a plan.    FU 6 mos  Lynder Parents, MD, DFAPA   Please see After Visit Summary for patient specific instructions.  Future Appointments  Date Time Provider [place]  11/30/2020  3:10 PM Baldwin [place], PA-C [facility]  12/14/2020  7:00 AM [facility] DEVICE REMOTES [facility]  03/15/2021  7:00 AM [facility] DEVICE REMOTES [facility]  06/14/2021  7:00 AM [facility] DEVICE REMOTES [facility]    No orders of the defined types were placed in this encounter.     -------------------------------

## 2020-11-19 ENCOUNTER — Other Ambulatory Visit (HOSPITAL_COMMUNITY): Payer: Self-pay

## 2020-11-20 ENCOUNTER — Other Ambulatory Visit (HOSPITAL_COMMUNITY): Payer: Self-pay

## 2020-11-29 NOTE — Progress Notes (Signed)
Cardiology Office Note Date:  11/29/2020  Patient ID:  [name], DOB Jan 21, 1970, MRN [id number] PCP:  Patient, No Pcp Per (Inactive)  Electrophysiologist: Dr. Caryl Comes   Chief Complaint:  overdue visit  History of Present Illness: [name] is a 50 y.o. 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 Caryl Comes Sept 2016 who noted hx of symptomatic sustained ventricular tachycardia. Discussions were had concerning catheter ablation. It was elected to use sotalol  but that was never initiated.  He has had recurrent prolonged nonsustained ventricular tachycardia; his device has been programmed to minimize therapy for this.  He also carries hx of ETOH abuse, none for a couple years, ADHD, asthma, and CHF.    May 2016, hospital admit for PAFib, single event of AF had been noted appears at that time he was intolerant of amiodarone with side effects, and was started on Sotalol for his VT and AF, no chronic a/c was started.   He comes in today to be seen for Dr. Caryl Comes, last seen by him Aug 2020, he was doing well, reported good exercise tolerance.  Noted to have SVT terminated by antitachycardia pacing.  After discussions with the patient, we have elected to maintain ATP for tachycardia is faster than 170 less than 200 but to turn off shocks in this zone. There is mention of h/o orthostatic lightheadedness that was improved  I saw him 07/25/19: He feels well. He has felt 2x that he had Afib, the most recent in Feb, he is immediately aware of his heart beat, is fast and uncomfortable feeling.  No CP, no dizziness, near syncope or syncope, not overtly SOB but makes him feel weak, poorly. No clear trigger, though perhaps after salty meal. Outside of that he feels like he is in a good place, no CP, SOB, DOE, feels like he has good exertional capacity No near syncope or syncope Tolerating his  medicines. Planned to update labs and echo.  Discussed his CHA2DS2Vasc score (one for HF) and burden, pt was most comfortable proceeding without a/c and following AF burden.  He did not follow through with the echo.  Recently reached out via Mychart with an unusually long AF episode,  And ?? diaphrag stim  I saw him 01/29/20 This is the 1st episode of AF since last year. He seems to recall that last year was as well in the environment of increased "slow burn" stress. What bothers him the most is the intermittent thumping/jumping from his device that he feels when he is in Afib, never feels it otherwise. Mentions early in his device history having the thumping/jumping much more often though programming all but eliminated this unless he is in Afib Outside of the Afib doing well, no CP or SOB, no DOE. No near syncope or syncope. He has found with gout, no other changes or new medical diagnosis' I was unable to reproduce symptoms/diaphragmatic stim No changes to sotalol with low Af burden Not felt to be volume OL Planned to update his echo and f/u   He cancelled and did not want to reschedule echo  TODAY He insurance has lapsed and this is why he cancelled the echo. He is feeling well, does not think he has had any Afib since his last visit No CP, SOB No near syncope or syncope. No remotes since March, he suspects his transmitter is unplugged  He inquires about  a medicine [name] for his HF management    DEVICE information:   MDT, CRT-D, upgrade 06/30/14, Dr. Caryl Comes, original device 2012 secondary prevention/CM  Aug 2015 appropriate shock for VT ICD shock 8/15  ATP failed to terminate VT, 24J shock failed and 35 joule shock was successful  There was associated syncope  AAD Hx Sotalol started 2016    Past Medical History:  Diagnosis Date   ADHD (attention deficit hyperactivity disorder)    AICD (automatic cardioverter/defibrillator) present 06/30/2014   BiV ICD Insertion CRT-D    Asthma    ETOH abuse    Heart murmur    Nonischemic cardiomyopathy ([place])    PAF (paroxysmal atrial fibrillation) ([place])    Pneumonia ~ 01/2014   Syncope    episode occured Feb 2011, ICD placed   Systolic congestive heart failure ([place])    With ejection fraction of 25%, normal coronaries on Cath 02/2010   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   Social History:  The patient  reports that he has never smoked. He has been exposed to tobacco smoke. He has never used smokeless tobacco. He reports current alcohol use of about 28.0 standard drinks per week. He reports that he does not use drugs.   Family History:  The patient's family history includes Heart disease in his mother; Pancreatic cancer in his maternal grandfather.  ROS:  Please see the history of present illness.  All other systems are reviewed and otherwise negative.   PHYSICAL EXAM:  VS:  There were no vitals taken for this visit. BMI: There is no height or weight on file to calculate BMI. Well nourished, well developed, in no acute distress  HEENT: normocephalic, atraumatic  Neck: no JVD, carotid bruits or masses Cardiac:  RRR; no significant murmurs, no rubs, or gallops Lungs:  CTA b/l, no wheezing, rhonchi or rales  Abd: soft, nontender MS: no deformity or atrophy Ext:   no edema  Skin: warm and dry, no rash Neuro:  No gross deficits appreciated Psych: euthymic mood, full affect  ICD site is stable, no tethering  or discomfort   EKG:  Done today and reviewed by myself shows  Patient declined EKG today SR 67bpm, LAD, icRBBB, QTc 421ms 07/25/19: SR 72bpm, PR 132ms, QRS 166ms, QT 445ms/QTc 440ms QT stable, appears similr to prior  Device interrogation done today and reviewed by myself : Battery and lead measurements are good No AF or VT, 2 episodes labled NSVT are fleeting AT 97% VP (98.1%LV 1.9% BIV) OptiVol is good   09/19/2016: TTE Study Conclusions  - Left ventricle: The cavity size was moderately dilated. There was    mild concentric hypertrophy. Systolic function was mildly    reduced. The estimated ejection fraction was in the range of 45%    to 50%. Wall motion was normal; there were no regional wall    motion abnormalities. Doppler parameters are consistent with  abnormal left ventricular relaxation (grade 1 diastolic    dysfunction). There was no evidence of elevated ventricular    filling pressure by Doppler parameters.  - Aortic root: The aortic root was mildly dilated measuring 42 mm.  - Ascending aorta: The ascending aorta was normal in size.  - Mitral valve: There was mild regurgitation.  - Left atrium: The atrium was mildly dilated.  - Right ventricle: Pacer wire or catheter noted in right ventricle.    Systolic function was normal.  - Right atrium: Pacer wire or catheter noted in right atrium.  - Tricuspid valve: There was mild regurgitation.  - Inferior vena cava: The vessel was normal in size.  - Pericardium, extracardiac: There was no pericardial effusion.   Impressions:  - When compared to the prior study from 05/26/2014 LVEF has improved    from 30% to 45-50% with diffuse hypokinesis. LV remains    moderately dilated.    05/26/14: Echocardiogram Study Conclusions  - Left ventricle: The cavity size was mildly dilated. Wall   thickness was increased in a pattern of mild LVH. The estimated   ejection fraction was 30%. Diffuse hypokinesis. Septal-lateral    dyssynchrony noted. Features are consistent with a pseudonormal   left ventricular filling pattern, with concomitant abnormal   relaxation and increased filling pressure (grade 2 diastolic   dysfunction). - Aortic valve: There was no stenosis. - Aorta: Mildly dilated aortic root. Aortic root dimension: 41 mm   (ED). - Mitral valve: There was trivial regurgitation. - Left atrium: The atrium was mildly dilated. - Right ventricle: The cavity size was normal. Pacer wire or   catheter noted in right ventricle. Systolic function was normal. - Right atrium: The atrium was mildly dilated. - Pulmonary arteries: No complete TR doppler jet so unable to   estimate PA systolic pressure. - Inferior vena cava: The vessel was normal in size. The   respirophasic diameter changes were in the normal range (= 50%),   consistent with normal central venous pressure. Impressions:  - Mildly dilated LV with mild LV hypertrophy. EF 30% with diffuse   hypokinesis and septal-lateral dyssynchrony. Normal RV size and   systolic function. No significant valvular abnormalities.     Recent Labs: 01/29/2020: BUN 12; Creatinine, Ser 0.92; Magnesium 2.1; Potassium 5.0; Sodium 139  No results found for requested labs within last 8760 hours.   CrCl cannot be calculated (Patient's most recent lab result is older than the maximum 21 days allowed.).   Wt Readings from Last 3 Encounters:  01/29/20 207 lb (93.9 kg)  12/20/19 217 lb (98.4 kg)  11/14/19 215 lb (97.5 kg)     Other studies reviewed: Additional studies/records reviewed today include: summarized above   ASSESSMENT AND PLAN:  1. ICD     Intact function      No programming changes made   2. NICM 3. Chronic CHF     Some improvement in his LVEF by echo in 2018, 45-50%      No symptoms or exam findings of volume OL     OptiVol looks great     On BB, Entresto, aldactone           4. VT     None noted     On sotalol, discussed importance of EKG,  labs, though financially asks to wait until January when his insurance is back in force     He will call and make the appointment   5. SVT  Fleeting AT      Historically gets success with ATP in this zone (noted previously and programmed by Dr. Caryl Comes)  6. PAFib     CHA2DS2Vasc remains one.      <0.1%  Disposition: labs, EKG in jan, he will check his transmitter if it is plugged in will reach out to the device clinic to troubleshoot.  See him back in 29mo, sooner if needed    Current medicines are reviewed at length with the patient today.  The patient did not have any concerns regarding medicines.  Haywood Lasso, PA-C 11/29/2020 11:11 AM     [facility] [address] [place] [address] [phone] (office)  [phone] (fax)

## 2020-11-30 ENCOUNTER — Other Ambulatory Visit: Payer: Self-pay

## 2020-11-30 ENCOUNTER — Encounter: Payer: Self-pay | Admitting: Physician Assistant

## 2020-11-30 ENCOUNTER — Ambulatory Visit (INDEPENDENT_AMBULATORY_CARE_PROVIDER_SITE_OTHER): Payer: Self-pay | Admitting: Physician Assistant

## 2020-11-30 VITALS — BP 98/60 | HR 78 | Ht 77.0 in | Wt 207.0 lb

## 2020-11-30 DIAGNOSIS — I480 Paroxysmal atrial fibrillation: Secondary | ICD-10-CM

## 2020-11-30 DIAGNOSIS — I471 Supraventricular tachycardia, unspecified: Secondary | ICD-10-CM

## 2020-11-30 DIAGNOSIS — Z79899 Other long term (current) drug therapy: Secondary | ICD-10-CM

## 2020-11-30 DIAGNOSIS — I472 Ventricular tachycardia, unspecified: Secondary | ICD-10-CM

## 2020-11-30 DIAGNOSIS — Z95810 Presence of automatic (implantable) cardiac defibrillator: Secondary | ICD-10-CM

## 2020-11-30 DIAGNOSIS — I428 Other cardiomyopathies: Secondary | ICD-10-CM

## 2020-11-30 DIAGNOSIS — F909 Attention-deficit hyperactivity disorder, unspecified type: Secondary | ICD-10-CM

## 2020-11-30 DIAGNOSIS — I5022 Chronic systolic (congestive) heart failure: Secondary | ICD-10-CM

## 2020-11-30 LAB — CUP PACEART INCLINIC DEVICE CHECK
Battery Remaining Longevity: 25 mo
Battery Voltage: 2.89 V
Brady Statistic AP VP Percent: 0.27 %
Brady Statistic AP VS Percent: 0.02 %
Brady Statistic AS VP Percent: 98.29 %
Brady Statistic AS VS Percent: 1.42 %
Brady Statistic RA Percent Paced: 0.28 %
Brady Statistic RV Percent Paced: 1.87 %
Date Time Interrogation Session: 20221128182240
HighPow Impedance: 49 Ohm
HighPow Impedance: 65 Ohm
Implantable Lead Implant Date: 20120208
Implantable Lead Implant Date: 20160627
Implantable Lead Implant Date: 20160627
Implantable Lead Location: 753858
Implantable Lead Location: 753860
Implantable Lead Location: 753860
Implantable Lead Model: 185
Implantable Lead Model: 4598
Implantable Lead Model: 5076
Implantable Lead Serial Number: 345409
Implantable Pulse Generator Implant Date: 20160627
Lead Channel Impedance Value: 399 Ohm
Lead Channel Impedance Value: 399 Ohm
Lead Channel Impedance Value: 418 Ohm
Lead Channel Impedance Value: 418 Ohm
Lead Channel Impedance Value: 456 Ohm
Lead Channel Impedance Value: 475 Ohm
Lead Channel Impedance Value: 475 Ohm
Lead Channel Impedance Value: 475 Ohm
Lead Channel Impedance Value: 665 Ohm
Lead Channel Impedance Value: 703 Ohm
Lead Channel Impedance Value: 760 Ohm
Lead Channel Impedance Value: 760 Ohm
Lead Channel Impedance Value: 760 Ohm
Lead Channel Pacing Threshold Amplitude: 0.5 V
Lead Channel Pacing Threshold Amplitude: 0.5 V
Lead Channel Pacing Threshold Amplitude: 1.875 V
Lead Channel Pacing Threshold Pulse Width: 0.4 ms
Lead Channel Pacing Threshold Pulse Width: 0.4 ms
Lead Channel Pacing Threshold Pulse Width: 0.4 ms
Lead Channel Sensing Intrinsic Amplitude: 2.125 mV
Lead Channel Sensing Intrinsic Amplitude: 3.875 mV
Lead Channel Sensing Intrinsic Amplitude: 6.875 mV
Lead Channel Sensing Intrinsic Amplitude: 8.625 mV
Lead Channel Setting Pacing Amplitude: 1.25 V
Lead Channel Setting Pacing Amplitude: 1.5 V
Lead Channel Setting Pacing Amplitude: 2 V
Lead Channel Setting Pacing Pulse Width: 0.4 ms
Lead Channel Setting Pacing Pulse Width: 0.8 ms
Lead Channel Setting Sensing Sensitivity: 0.45 mV

## 2020-11-30 NOTE — Patient Instructions (Signed)
Medication Instructions:   Your physician recommends that you continue on your current medications as directed. Please refer to the Current Medication list given to you today.   *If you need a refill on your cardiac medications before your next appointment, please call your pharmacy*   Lab Work: [facility] AND [place] ( [place])  If you have labs (blood work) drawn today and your tests are completely normal, you will receive your results only by: [place] (if you have MyChart) OR A paper copy in the mail If you have any lab test that is abnormal or we need to change your treatment, we will call you to review the results.   Testing/Procedures: NONE ORDERED  TODAY    Follow-Up: At [facility], you and your health needs are our priority.  As part of our continuing mission to provide you with exceptional heart care, we have created designated Provider Care Teams.  These Care Teams include your primary Cardiologist (physician) and Advanced Practice Providers (APPs -  Physician Assistants and Nurse Practitioners) who all work together to provide you with the care you need, when you need it.  We recommend signing up for the patient portal called "MyChart".  Sign up information is provided on this After Visit Summary.  MyChart is used to connect with patients for Virtual Visits (Telemedicine).  Patients are able to view lab/test results, encounter notes, upcoming appointments, etc.  Non-urgent messages can be sent to your provider as well.   To learn more about what you can do with MyChart, go to [email or web address].    Your next appointment:   6 month(s)  The format for your next appointment:   In Person  Provider:   You may see Dr. Caryl Comes.  or one of the following Advanced Practice Providers on your designated Care Team:   Tommye Standard, [place] Legrand Como "Jonni Sanger" Chalmers Cater, [place]    Other Instructions

## 2020-12-18 ENCOUNTER — Other Ambulatory Visit: Payer: Self-pay | Admitting: Psychiatry

## 2020-12-18 ENCOUNTER — Other Ambulatory Visit (HOSPITAL_COMMUNITY): Payer: Self-pay

## 2020-12-18 DIAGNOSIS — F900 Attention-deficit hyperactivity disorder, predominantly inattentive type: Secondary | ICD-10-CM

## 2020-12-18 MED ORDER — LISDEXAMFETAMINE DIMESYLATE 70 MG PO CAPS
70.0000 mg | ORAL_CAPSULE | Freq: Every day | ORAL | 0 refills | Status: DC
  Filled 2020-12-18: qty 30, 30d supply, fill #0

## 2020-12-21 ENCOUNTER — Other Ambulatory Visit (HOSPITAL_COMMUNITY): Payer: Self-pay

## 2021-01-19 ENCOUNTER — Other Ambulatory Visit (HOSPITAL_COMMUNITY): Payer: Self-pay

## 2021-01-19 ENCOUNTER — Other Ambulatory Visit (HOSPITAL_COMMUNITY): Payer: Self-pay | Admitting: Cardiology

## 2021-01-19 ENCOUNTER — Other Ambulatory Visit: Payer: Self-pay | Admitting: Family Medicine

## 2021-01-19 ENCOUNTER — Other Ambulatory Visit: Payer: Self-pay | Admitting: Internal Medicine

## 2021-01-19 MED ORDER — ALLOPURINOL 100 MG PO TABS
100.0000 mg | ORAL_TABLET | Freq: Every day | ORAL | 1 refills | Status: DC
  Filled 2021-01-19: qty 90, 90d supply, fill #0
  Filled 2021-05-17: qty 90, 90d supply, fill #1

## 2021-01-20 ENCOUNTER — Other Ambulatory Visit (HOSPITAL_COMMUNITY): Payer: Self-pay

## 2021-01-20 MED ORDER — CARVEDILOL 12.5 MG PO TABS
12.5000 mg | ORAL_TABLET | Freq: Two times a day (BID) | ORAL | 3 refills | Status: DC
  Filled 2021-01-20: qty 180, 90d supply, fill #0
  Filled 2021-04-19: qty 180, 90d supply, fill #1
  Filled 2021-07-15: qty 180, 90d supply, fill #2
  Filled 2021-10-20: qty 180, 90d supply, fill #3

## 2021-01-20 MED ORDER — SOTALOL HCL 80 MG PO TABS
80.0000 mg | ORAL_TABLET | Freq: Two times a day (BID) | ORAL | 3 refills | Status: DC
  Filled 2021-01-20: qty 180, 90d supply, fill #0
  Filled 2021-04-19: qty 180, 90d supply, fill #1
  Filled 2021-07-15: qty 180, 90d supply, fill #2
  Filled 2021-10-20: qty 180, 90d supply, fill #3

## 2021-01-30 IMAGING — DX DG CERVICAL SPINE 2 OR 3 VIEWS
3 series · 3 of 3 positions shown · non-contrast
Comparison: October 01, 2010

CLINICAL DATA: Cervicalgia

EXAM:
CERVICAL SPINE - 2-3 VIEW

[c-spine lat]
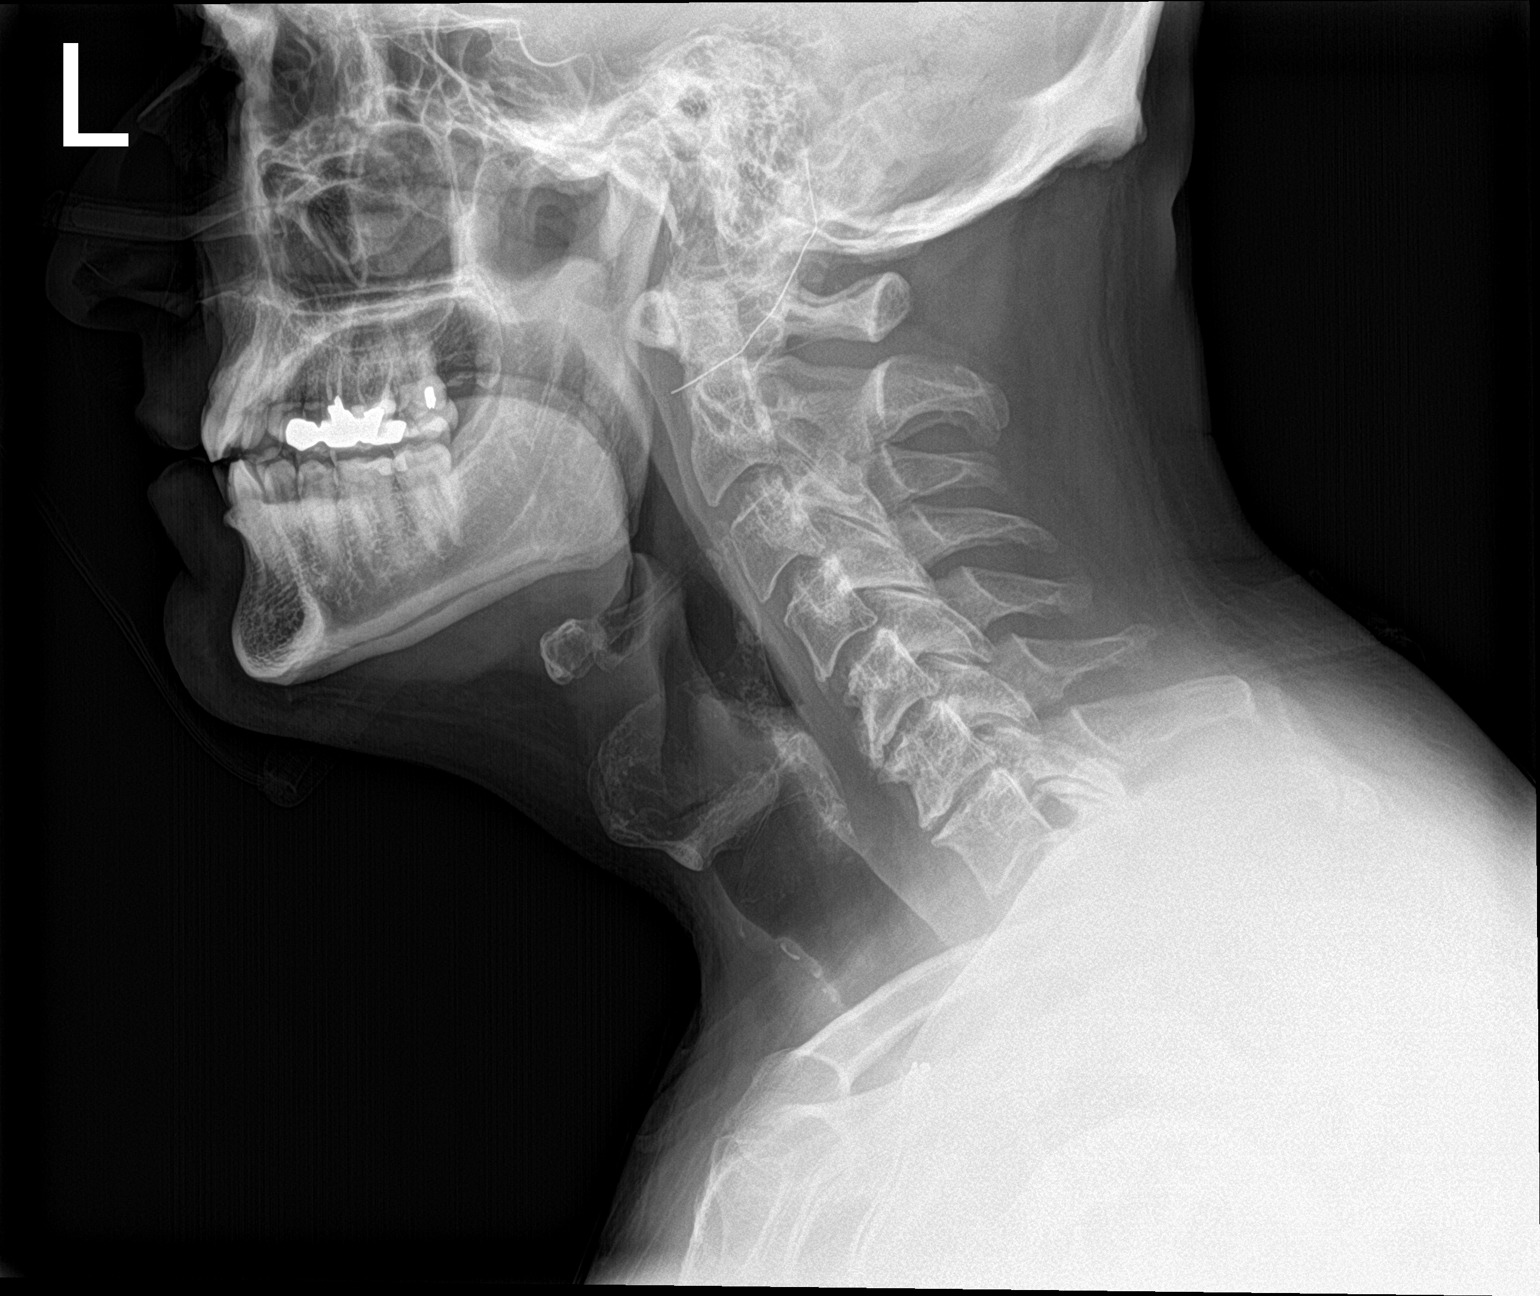

[c-spine ap]
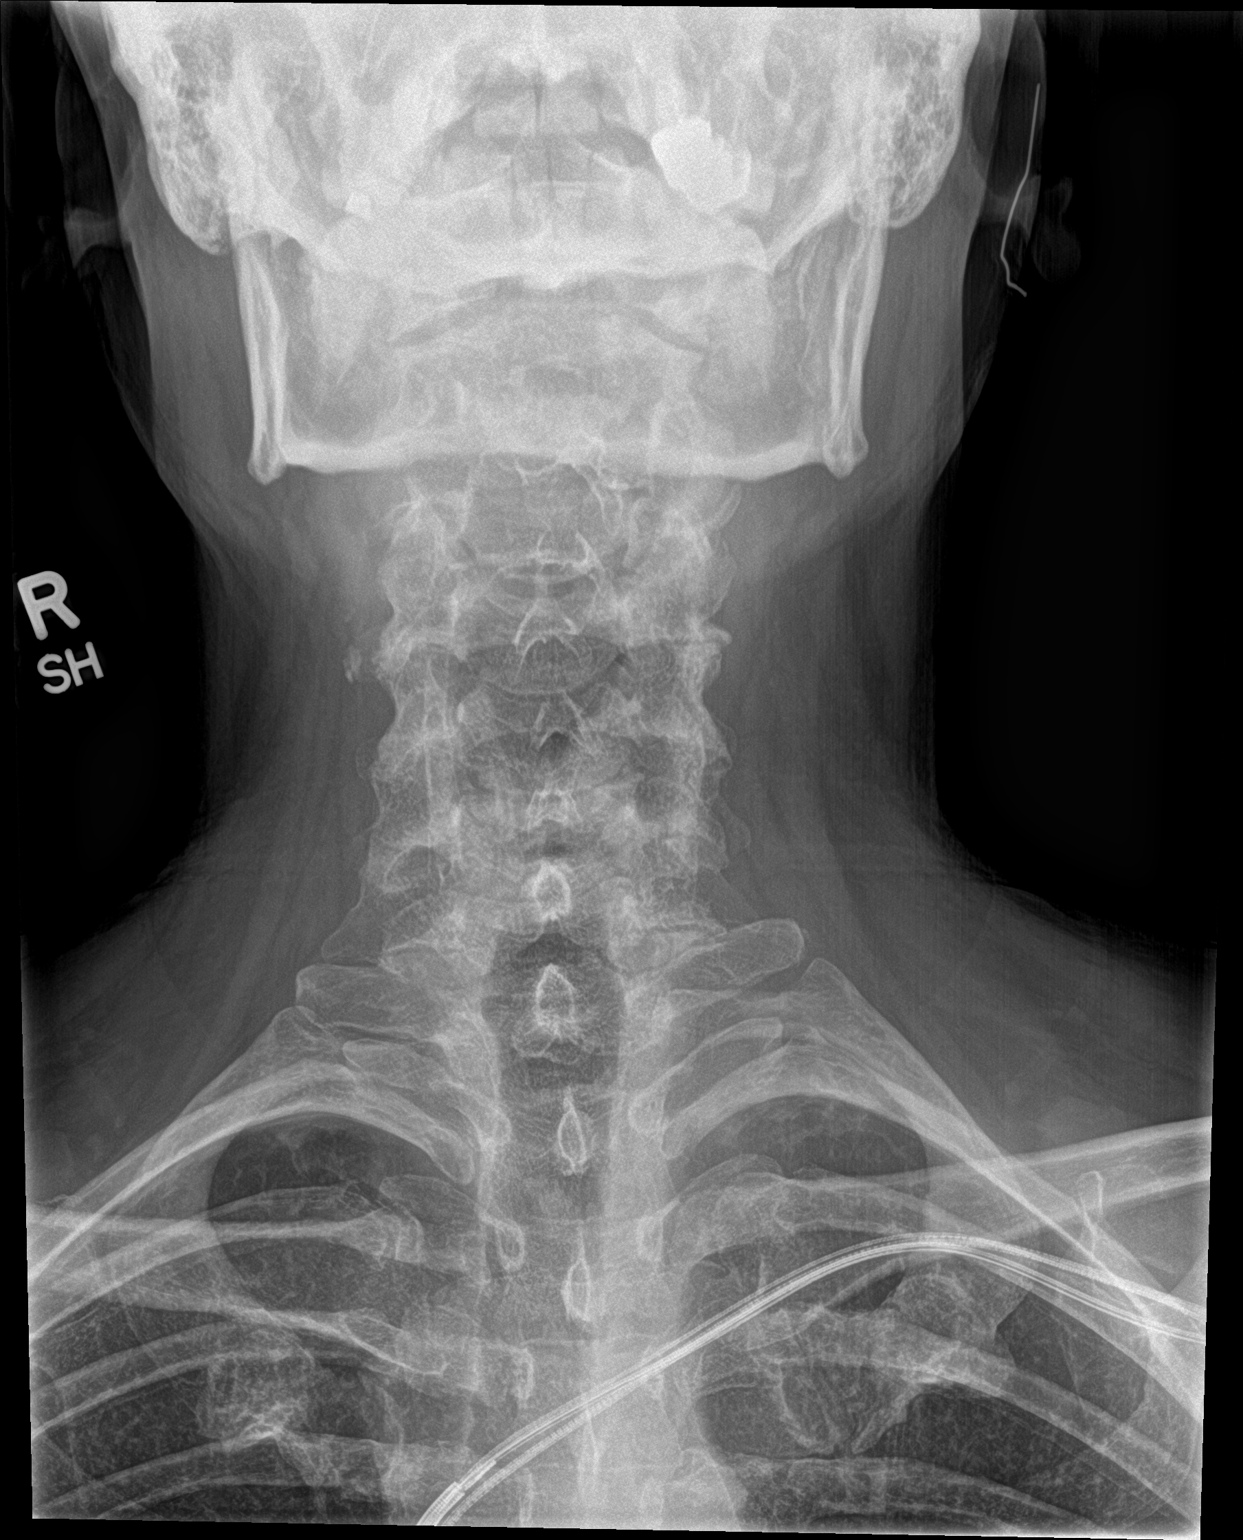

[c-spine open mouth]
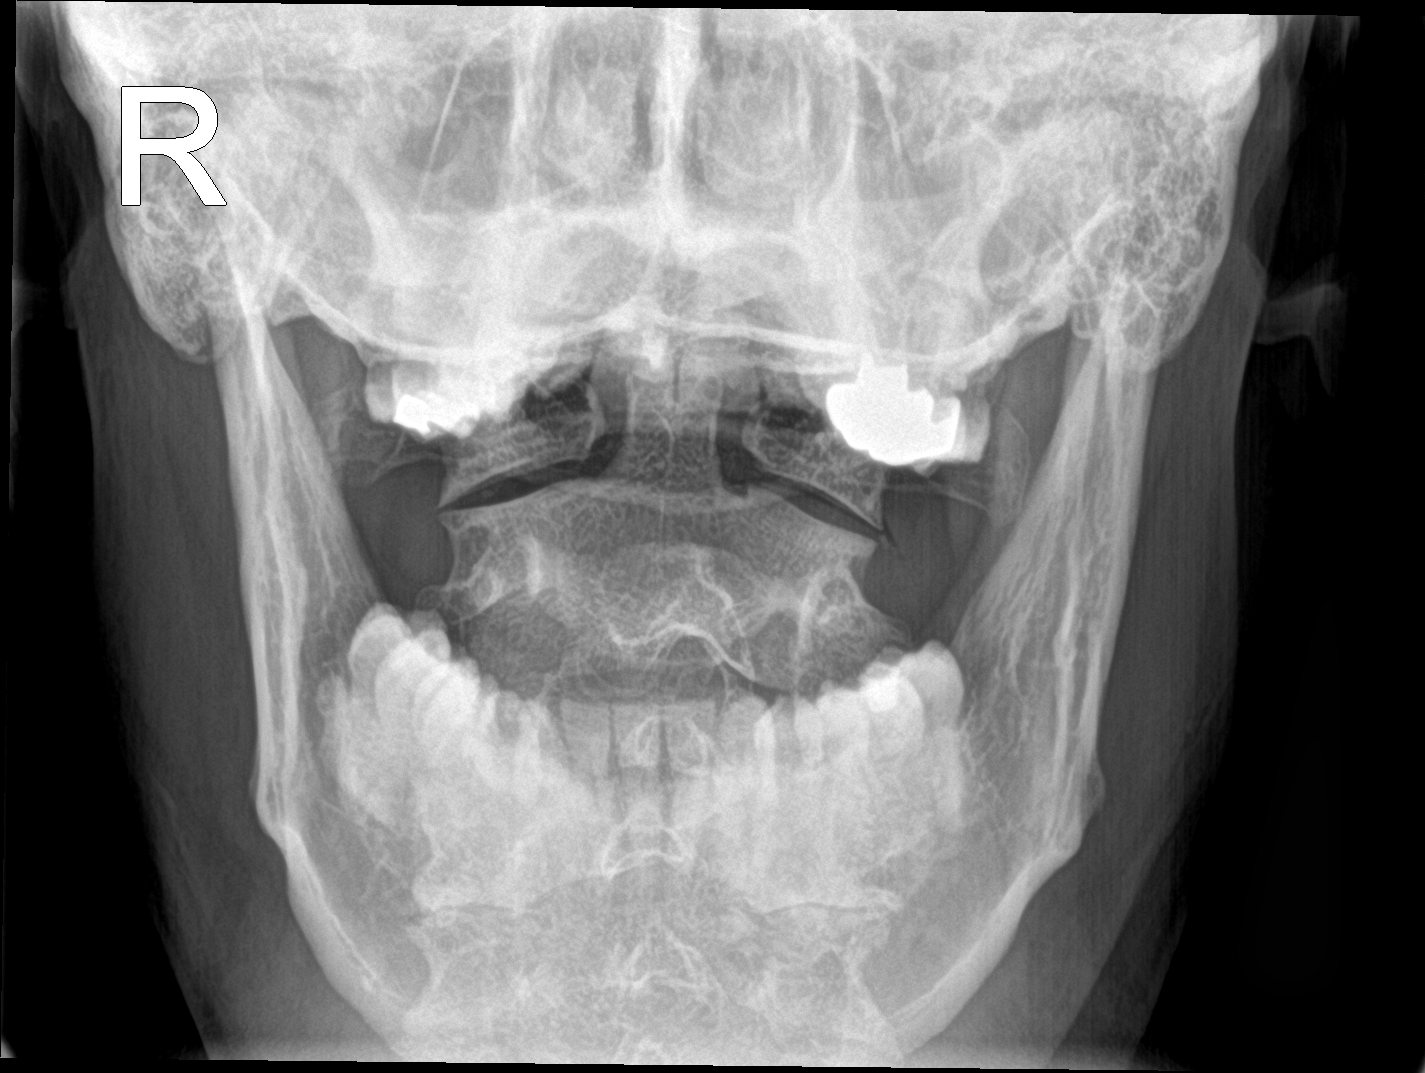

[3 of 3 positions shown; findings below may reference images not displayed]

FINDINGS: Frontal, lateral, and open-mouth odontoid images were obtained.
There is no fracture or spondylolisthesis. Prevertebral soft tissues
and predental space regions are normal. There is moderately severe
disc space narrowing at C5-6 and C6-7 with progression of
arthropathy in these areas compared to the 6786 study. Other disc
spaces appear unremarkable. No erosive change. Lung apices are
clear. There is a focal area of calcification in the right carotid
artery.
IMPRESSION: Progression of osteoarthritic change at C5-6 and C6-7 compared to
prior study. No fracture or spondylolisthesis.

Focus of calcification in right carotid artery noted.

## 2021-02-15 ENCOUNTER — Other Ambulatory Visit: Payer: Self-pay | Admitting: Psychiatry

## 2021-02-15 ENCOUNTER — Other Ambulatory Visit (HOSPITAL_COMMUNITY): Payer: Self-pay

## 2021-02-15 DIAGNOSIS — F900 Attention-deficit hyperactivity disorder, predominantly inattentive type: Secondary | ICD-10-CM

## 2021-02-15 MED ORDER — LISDEXAMFETAMINE DIMESYLATE 70 MG PO CAPS
70.0000 mg | ORAL_CAPSULE | Freq: Every day | ORAL | 0 refills | Status: DC
  Filled 2021-02-15: qty 30, 30d supply, fill #0

## 2021-02-16 ENCOUNTER — Other Ambulatory Visit (HOSPITAL_COMMUNITY): Payer: Self-pay

## 2021-03-15 ENCOUNTER — Other Ambulatory Visit (HOSPITAL_COMMUNITY): Payer: Self-pay

## 2021-03-15 ENCOUNTER — Other Ambulatory Visit: Payer: Self-pay | Admitting: Internal Medicine

## 2021-03-15 NOTE — Telephone Encounter (Signed)
Pt's pharmacy is requesting a refill on sildenafil. Would Dr. Caryl Comes like to refill this medication? Please address ?

## 2021-03-16 ENCOUNTER — Other Ambulatory Visit (HOSPITAL_COMMUNITY): Payer: Self-pay

## 2021-03-17 ENCOUNTER — Other Ambulatory Visit: Payer: Self-pay | Admitting: Psychiatry

## 2021-03-17 ENCOUNTER — Other Ambulatory Visit (HOSPITAL_COMMUNITY): Payer: Self-pay

## 2021-03-17 DIAGNOSIS — F900 Attention-deficit hyperactivity disorder, predominantly inattentive type: Secondary | ICD-10-CM

## 2021-03-18 ENCOUNTER — Other Ambulatory Visit (HOSPITAL_COMMUNITY): Payer: Self-pay

## 2021-03-18 MED ORDER — LISDEXAMFETAMINE DIMESYLATE 70 MG PO CAPS
70.0000 mg | ORAL_CAPSULE | Freq: Every day | ORAL | 0 refills | Status: DC
  Filled 2021-03-18: qty 30, 30d supply, fill #0

## 2021-03-19 ENCOUNTER — Other Ambulatory Visit (HOSPITAL_COMMUNITY): Payer: Self-pay

## 2021-03-29 ENCOUNTER — Ambulatory Visit (INDEPENDENT_AMBULATORY_CARE_PROVIDER_SITE_OTHER): Payer: Self-pay

## 2021-03-29 DIAGNOSIS — I428 Other cardiomyopathies: Secondary | ICD-10-CM

## 2021-03-30 LAB — CUP PACEART REMOTE DEVICE CHECK
Battery Remaining Longevity: 19 mo
Battery Voltage: 2.91 V
Brady Statistic AP VP Percent: 0.09 %
Brady Statistic AP VS Percent: 0.01 %
Brady Statistic AS VP Percent: 98.46 %
Brady Statistic AS VS Percent: 1.45 %
Brady Statistic RA Percent Paced: 0.1 %
Brady Statistic RV Percent Paced: 1.83 %
Date Time Interrogation Session: 20230324142349
HighPow Impedance: 41 Ohm
HighPow Impedance: 55 Ohm
Implantable Lead Implant Date: 20120208
Implantable Lead Implant Date: 20160627
Implantable Lead Implant Date: 20160627
Implantable Lead Location: 753858
Implantable Lead Location: 753860
Implantable Lead Location: 753860
Implantable Lead Model: 185
Implantable Lead Model: 4598
Implantable Lead Model: 5076
Implantable Lead Serial Number: 345409
Implantable Pulse Generator Implant Date: 20160627
Lead Channel Impedance Value: 342 Ohm
Lead Channel Impedance Value: 342 Ohm
Lead Channel Impedance Value: 361 Ohm
Lead Channel Impedance Value: 361 Ohm
Lead Channel Impedance Value: 399 Ohm
Lead Channel Impedance Value: 418 Ohm
Lead Channel Impedance Value: 418 Ohm
Lead Channel Impedance Value: 456 Ohm
Lead Channel Impedance Value: 589 Ohm
Lead Channel Impedance Value: 589 Ohm
Lead Channel Impedance Value: 646 Ohm
Lead Channel Impedance Value: 646 Ohm
Lead Channel Impedance Value: 665 Ohm
Lead Channel Pacing Threshold Amplitude: 0.375 V
Lead Channel Pacing Threshold Amplitude: 0.5 V
Lead Channel Pacing Threshold Amplitude: 1.875 V
Lead Channel Pacing Threshold Pulse Width: 0.4 ms
Lead Channel Pacing Threshold Pulse Width: 0.4 ms
Lead Channel Pacing Threshold Pulse Width: 0.4 ms
Lead Channel Sensing Intrinsic Amplitude: 2.375 mV
Lead Channel Sensing Intrinsic Amplitude: 2.375 mV
Lead Channel Sensing Intrinsic Amplitude: 7.75 mV
Lead Channel Sensing Intrinsic Amplitude: 7.75 mV
Lead Channel Setting Pacing Amplitude: 1.25 V
Lead Channel Setting Pacing Amplitude: 1.5 V
Lead Channel Setting Pacing Amplitude: 2 V
Lead Channel Setting Pacing Pulse Width: 0.4 ms
Lead Channel Setting Pacing Pulse Width: 0.8 ms
Lead Channel Setting Sensing Sensitivity: 0.45 mV

## 2021-04-01 ENCOUNTER — Ambulatory Visit: Payer: BC Managed Care – PPO | Admitting: Family Medicine

## 2021-04-01 ENCOUNTER — Ambulatory Visit: Payer: Self-pay

## 2021-04-01 ENCOUNTER — Other Ambulatory Visit (HOSPITAL_COMMUNITY): Payer: Self-pay

## 2021-04-01 VITALS — BP 120/70 | Ht 77.0 in | Wt 212.0 lb

## 2021-04-01 DIAGNOSIS — M79641 Pain in right hand: Secondary | ICD-10-CM

## 2021-04-01 DIAGNOSIS — S66819A Strain of other specified muscles, fascia and tendons at wrist and hand level, unspecified hand, initial encounter: Secondary | ICD-10-CM | POA: Diagnosis not present

## 2021-04-01 DIAGNOSIS — M1812 Unilateral primary osteoarthritis of first carpometacarpal joint, left hand: Secondary | ICD-10-CM | POA: Insufficient documentation

## 2021-04-01 MED ORDER — PREDNISONE 20 MG PO TABS
ORAL_TABLET | ORAL | 0 refills | Status: DC
  Filled 2021-04-01: qty 18, 10d supply, fill #0

## 2021-04-01 NOTE — Assessment & Plan Note (Signed)
Acutely occurring.  Left wrist more associated with the [facility] joint. ?-Counseled on home exercise therapy and supportive care. ?-Could consider bracing or injection.  ?

## 2021-04-01 NOTE — Progress Notes (Signed)
?  [name] - 51 y.o. male MRN [id number]  Date of birth: 1970/07/13 ? ?SUBJECTIVE:  Including CC & ROS.  ?No chief complaint on file. ? ? ?[name] is a 51 y.o. male that is presenting with acute right and left wrist pain.  The pain has been ongoing for about 2 months.  Denies any specific injury.  He does notice the pain more when he is playing a video game.  No history of similar pain.  No history of surgery. ? ? ?Review of Systems ?See HPI  ? ?HISTORY: Past Medical, Surgical, Social, and Family History Reviewed & Updated per EMR.   ?Pertinent Historical Findings include: ? ?Past Medical History:  ?Diagnosis Date  ? ADHD (attention deficit hyperactivity disorder)   ? 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 BiV ICD Insertion CRT-D  ? Asthma   ? ETOH abuse   ? Heart murmur   ? Nonischemic cardiomyopathy ([place])   ? PAF (paroxysmal atrial fibrillation) ([place])   ? Pneumonia ~ 01/2014  ? Syncope   ? episode occured Feb 2011, ICD placed  ? Systolic congestive heart failure ([place])   ? With ejection fraction of 25%, normal coronaries on Cath 02/2010  ? 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[facility]) 1998  ? "right"  ? Ventricular tachycardia   ? ? ?Past Surgical History:  ?Procedure Laterality Date  ? CARDIAC DEFIBRILLATOR PLACEMENT  2014  ? "single lead; [employer]"  ? EP IMPLANTABLE DEVICE N/A 06/30/2014  ? Procedure: BiV ICD Insertion CRT-D;  Surgeon: Deboraha Sprang, MD;  Location: [place] [facility];  Service: Cardiovascular;  Laterality: N/A;  ? FRACTURE SURGERY Left ~ 1988  ? ORCHIECTOMY Right 1998  ? with abdominal lymph node dissection  ? WRIST FRACTURE SURGERY    ? ? ? ?PHYSICAL EXAM:  ?VS: BP 120/70   Ht '6\' 5"'$  (1.956 m)   Wt 212 lb (96.2 kg)   BMI 25.14 kg/m?  ?Physical Exam ?Gen: NAD, alert, cooperative with exam, well-appearing ?MSK:  ?Neurovascularly intact   ? ?Limited ultrasound: Right and left wrist: ? ?Right wrist: ?Normal appearing median nerve. ?There is an effusion and thickening of the  flexor pollicis longus.  There is hyperemia at the insertion of this. ?Normal-appearing CMC joint. ? ?Left wrist: ?Normal-appearing median nerve. ?Moderate degenerative changes of the [facility] joint. ? ?Summary: Right wrist with tenosynovitis of the flexor pollicis longus and the left wrist with CMC joint arthritis ? ?Ultrasound and interpretation by Clearance Coots, MD ? ? ? ?ASSESSMENT & PLAN:  ? ?Strain of flexor pollicis longus tendon ?Acutely occurring.  Changes of the flexor pollicis longus appreciated.  Likely related to repetitive activities. ?-Counseled on home exercise therapy and supportive care. ?-Prednisone. ?-Counseled on bracing. ?-Could consider injection. ? ?Arthritis of carpometacarpal [facility]) joint of left thumb ?Acutely occurring.  Left wrist more associated with the [facility] joint. ?-Counseled on home exercise therapy and supportive care. ?-Could consider bracing or injection.  ? ? ? ? ?

## 2021-04-01 NOTE — Assessment & Plan Note (Signed)
Acutely occurring.  Changes of the flexor pollicis longus appreciated.  Likely related to repetitive activities. ?-Counseled on home exercise therapy and supportive care. ?-Prednisone. ?-Counseled on bracing. ?-Could consider injection. ?

## 2021-04-01 NOTE — Patient Instructions (Signed)
Good to see you ?Please try ice as needed  ?Please try the full brace on the right   ?Please send me a message in MyChart with any questions or updates.  ?Please see me back in 4 weeks.  ? ?--Dr. Raeford Razor ? ?

## 2021-04-05 ENCOUNTER — Other Ambulatory Visit (HOSPITAL_COMMUNITY): Payer: Self-pay

## 2021-04-05 ENCOUNTER — Encounter: Payer: Self-pay | Admitting: Nurse Practitioner

## 2021-04-05 ENCOUNTER — Ambulatory Visit (INDEPENDENT_AMBULATORY_CARE_PROVIDER_SITE_OTHER): Payer: BC Managed Care – PPO | Admitting: Nurse Practitioner

## 2021-04-05 VITALS — BP 108/58 | HR 73 | Temp 97.9°F | Ht 77.0 in | Wt 206.2 lb

## 2021-04-05 DIAGNOSIS — M654 Radial styloid tenosynovitis [de Quervain]: Secondary | ICD-10-CM

## 2021-04-05 DIAGNOSIS — F909 Attention-deficit hyperactivity disorder, unspecified type: Secondary | ICD-10-CM

## 2021-04-05 DIAGNOSIS — N521 Erectile dysfunction due to diseases classified elsewhere: Secondary | ICD-10-CM

## 2021-04-05 DIAGNOSIS — Z13220 Encounter for screening for lipoid disorders: Secondary | ICD-10-CM

## 2021-04-05 DIAGNOSIS — R350 Frequency of micturition: Secondary | ICD-10-CM | POA: Diagnosis not present

## 2021-04-05 DIAGNOSIS — E782 Mixed hyperlipidemia: Secondary | ICD-10-CM

## 2021-04-05 DIAGNOSIS — N401 Enlarged prostate with lower urinary tract symptoms: Secondary | ICD-10-CM

## 2021-04-05 DIAGNOSIS — Z0000 Encounter for general adult medical examination without abnormal findings: Secondary | ICD-10-CM | POA: Diagnosis not present

## 2021-04-05 DIAGNOSIS — Z79899 Other long term (current) drug therapy: Secondary | ICD-10-CM

## 2021-04-05 DIAGNOSIS — J4520 Mild intermittent asthma, uncomplicated: Secondary | ICD-10-CM

## 2021-04-05 DIAGNOSIS — I119 Hypertensive heart disease without heart failure: Secondary | ICD-10-CM

## 2021-04-05 DIAGNOSIS — M10471 Other secondary gout, right ankle and foot: Secondary | ICD-10-CM

## 2021-04-05 DIAGNOSIS — Z4502 Encounter for adjustment and management of automatic implantable cardiac defibrillator: Secondary | ICD-10-CM

## 2021-04-05 DIAGNOSIS — Z8547 Personal history of malignant neoplasm of testis: Secondary | ICD-10-CM

## 2021-04-05 DIAGNOSIS — Z1211 Encounter for screening for malignant neoplasm of colon: Secondary | ICD-10-CM

## 2021-04-05 DIAGNOSIS — Z125 Encounter for screening for malignant neoplasm of prostate: Secondary | ICD-10-CM | POA: Diagnosis not present

## 2021-04-05 DIAGNOSIS — E559 Vitamin D deficiency, unspecified: Secondary | ICD-10-CM

## 2021-04-05 DIAGNOSIS — Z0001 Encounter for general adult medical examination with abnormal findings: Secondary | ICD-10-CM

## 2021-04-05 DIAGNOSIS — M1990 Unspecified osteoarthritis, unspecified site: Secondary | ICD-10-CM

## 2021-04-05 DIAGNOSIS — I480 Paroxysmal atrial fibrillation: Secondary | ICD-10-CM

## 2021-04-05 DIAGNOSIS — I5022 Chronic systolic (congestive) heart failure: Secondary | ICD-10-CM

## 2021-04-05 MED ORDER — SILDENAFIL CITRATE 50 MG PO TABS
50.0000 mg | ORAL_TABLET | Freq: Every day | ORAL | 3 refills | Status: DC | PRN
  Filled 2021-04-05: qty 4, 30d supply, fill #0
  Filled 2021-05-17: qty 4, 30d supply, fill #1
  Filled 2021-06-15: qty 4, 30d supply, fill #2
  Filled 2021-07-16: qty 4, 30d supply, fill #3
  Filled 2021-08-27: qty 4, 30d supply, fill #4
  Filled 2021-10-08: qty 4, 30d supply, fill #5
  Filled 2021-11-17: qty 4, 30d supply, fill #6
  Filled 2021-12-21: qty 4, 30d supply, fill #7
  Filled 2022-02-18: qty 4, 30d supply, fill #8
  Filled 2022-03-18: qty 4, 30d supply, fill #9

## 2021-04-05 NOTE — Progress Notes (Deleted)
Complete Physical ? ?Assessment and Plan: ? ?1. Encounter for general adult medical examination with abnormal findings ? ?- CBC with Differential/Platelet ?- COMPLETE METABOLIC PANEL WITH GFR ?- Lipid panel ?- VITAMIN D 25 Hydroxy (Vit-D Deficiency, Fractures) ?- TSH ?- PSA ?- Cologuard ?- Magnesium ? ?2. Paroxysmal atrial fibrillation (HCC)/ICD (implantable cardioverter-defibrillator)discharge/Chronic systolic CHF (congestive heart failure) (HCC)/Hypertension, heart controlled w/o assoc CHF/ ? ?Pacemaker in place and intact. ?BP currently well controlled and at goal. ?Continue ASA, Coreg, Entresto, Betapace, Aldactone, Magnesium ?Cardiology following ? ?3. Mild intermittent asthma, unspecified whether complicated ? ?Reviewed Step Up/Step Down Guidelines. ?No changes to medication at this time.   ?Medication currently effective. ?Patient to monitor for exacerbations.   ?Contact clinic if SABA/Rescue is >x2 week. ?Continue Proventil PRN. ? ?4. History of testicular cancer/Erectile Dysfunction, due to diseases classifed elsewhere  ? ?Continue Viagra ?Urology following. ? ?5. Radial styloid tenosynovitis (de quervain)/Generalized arthritis ? ?Continue Multivitamin, Turmeric, Glucosamine ?OTC analgesic PRN.  ?Prednisone taper PRN. ?Continue wrist brace. ?Rest when flared. ?Ortho following. ? ?6. Screening for colon cancer ? ?- Cologuard ? ?7. Screening for prostate cancer ? ?- PSA ? ? ?8. Elevated triglycerides with high cholesterol. ? ?Continue Omega 3 Fish Oil ?Limit processes meats and trans fats. ?Continue to eat a well balanced diet full of fruits and vegetables.   ? ? ?9.  ADHD ? ?Dosage discussed and currently effective. ?Continue Vyvanse/Adderall. ? ? ?Discussed med's effects and SE's. Screening labs and tests as requested with regular follow-up as recommended. ?Over 40 minutes of exam, counseling, chart review, and complex, high level critical decision making was performed this visit.  ? ?Future Appointments   ?Date Time Provider [place]  ?04/26/2021 11:30 AM Rosemarie Ax, MD [facility]  ?05/17/2021  1:00 PM Cottle, Billey Co., MD [facility] None  ?06/28/2021  8:10 AM [facility] DEVICE REMOTES [facility]  ?09/27/2021  8:10 AM [facility] DEVICE REMOTES [facility]  ? ? ?HPI  ?51 y.o. male  presents for a complete physical. He has History of testicular cancer; Asthma; HYPERTENSION, HEART CONTROLLED W/O ASSOC CHF; CARDIOMYOPATHY, SECONDARY; SYNCOPE; Congestive heart failure, unspecified; Paroxysmal ventricular tachycardia ([place]); S/P implantation of automatic cardioverter/defibrillator (AICD); Chronic systolic CHF (congestive heart failure) ([place]); Atrial fibrillation, unspecified; Ventricular tachycardia ([place]); ICD (implantable cardioverter-defibrillator) discharge; Atrial fibrillation ([place]); Attention deficit hyperactivity disorder (ADHD); Ulnar neuropathy of left upper extremity; Acute idiopathic gout involving toe of left foot; Cervical strain; Sternoclavicular joint pain, left; Strain of flexor pollicis longus tendon; and Arthritis of carpometacarpal (CMC) joint of left thumb on their problem list. ? ?Presents to clinic for annual physical exam.  He follow with Cardiology, Urology, Orthopedics.  Denies any acute cardiovascular symptoms in clinic today. Denies any acute urinary symptoms in clinic today.  Reports occasional "aches and pains."  Reports medication compliance.  Has no additional concerns in clinic.   ? ? ?BMI is Body mass index is 24.45 kg/m?., he has been working on diet and exercise. ?Wt Readings from Last 3 Encounters:  ?04/05/21 206 lb 3.2 oz (93.5 kg)  ?04/01/21 212 lb (96.2 kg)  ?11/30/20 207 lb (93.9 kg)  ? ?His blood pressure has been controlled at home, today their BP is BP: (!) 108/58 ?He does workout by walking approximately 5 mi daily on his feet and his job allows him to lift heavy equipment .  He denies chest pain, shortness of breath, dizziness.  ?He is not  on cholesterol medication and denies myalgias. His cholesterol has been elevated in  the past. The cholesterol last visit was:   ?Lab Results  ?Component Value Date  ? CHOL 200 03/02/2018  ? HDL 63 03/02/2018  ? LDLCALC 103 (H) 03/02/2018  ? TRIG 172 (H) 03/02/2018  ? CHOLHDL 3.2 03/02/2018  ? ? ?He has been working on diet and exercise. He is on bASA, he is on ACE/ARB and denies SE of these medication.. Last A1C in the office was:  ?Lab Results  ?Component Value Date  ? HGBA1C  02/05/2010  ?  5.4 ?(NOTE)                                                                       According to the ADA Clinical Practice Recommendations for 2011, when HbA1c is used as a screening test:   >=6.5%   Diagnostic of Diabetes Mellitus           (if abnormal result ? is confirmed)  5.7-6.4%   Increased risk of developing Diabetes Mellitus  References:Diagnosis and Classification of Diabetes Mellitus,Diabetes WIOX,7353,29(JMEQA 1):S62-S69 and Standards of Medical Care in         Diabetes - 2011,Diabetes STMH,9622,29  ?(Suppl 1):S11-S61.  ? ?Last GFR: ?No results found for: EGFR ?Patient is on Vitamin D supplement.   ?No results found for: VD25OH   ?Last PSA was: ?No results found for: PSA ? ? ?Current Medications:  ?Current Outpatient Medications on File Prior to Visit  ?Medication Sig Dispense Refill  ? albuterol (PROVENTIL HFA;VENTOLIN HFA) 108 (90 BASE) MCG/ACT inhaler Inhale 2 puffs into the lungs 4 (four) times daily as needed for wheezing. 1 Inhaler 5  ? allopurinol (ZYLOPRIM) 100 MG tablet Take 1 tablet by mouth daily 90 tablet 1  ? amphetamine-dextroamphetamine (ADDERALL) 10 MG tablet Take 1 tablet (10 mg total) by mouth daily in the afternoon.  ***01/11/2021 30 tablet 0  ? aspirin 81 MG tablet Take 81 mg by mouth daily.    ? carvedilol (COREG) 12.5 MG tablet Take 1 tablet by mouth 2 (two) times daily. Please make yearly appt. 180 tablet 3  ? fish oil-omega-3 fatty acids 1000 MG capsule Take 1 g by mouth daily.     ?  Glucosamine-Chondroit-Vit C-Mn (GLUCOSAMINE 1500 COMPLEX PO) Take 1 tablet by mouth daily.    ? lisdexamfetamine (VYVANSE) 70 MG capsule Take 1 capsule (70 mg total) by mouth daily. 30 capsule 0  ? MAGNESIUM OXIDE PO Take by mouth daily. Helps with sleep    ? Multiple Vitamin (MULTIVITAMIN WITH MINERALS) TABS tablet Take 1 tablet by mouth daily.    ? predniSONE (DELTASONE) 20 MG tablet TAKE 3 TABLETS BY MOUTH ONCE DAILY FOR 3 DAYS-- 2 TABLETS DAILY FOR 3 DAYS-- 1 TABLET DAILY FOR 2 DAYS-- 1/2 TABLET DAILY FOR 2 DAYS 18 tablet 0  ? sacubitril-valsartan (ENTRESTO) 24-26 MG Take 1 tablet by mouth 2 (two) times daily. 180 tablet 1  ? sildenafil (VIAGRA) 50 MG tablet Take 1 tablet (50 mg total) by mouth daily as needed for erectile dysfunction. Please call for office visit [phone] 6 tablet 2  ? sotalol (BETAPACE) 80 MG tablet Take 1 tablet by mouth every 12 (twelve) hours. Please make yearly appt. 180 tablet 3  ? spironolactone (ALDACTONE) 25 MG tablet TAKE 1  TABLET BY MOUTH ONCE DAILY 30 tablet 6  ? Turmeric 500 MG CAPS Take 1,500 mg by mouth daily.     ? UNABLE TO FIND Take 2 tablets by mouth daily. [place] mane mushroom extract    ? Coenzyme Q10 (CO Q 10 PO) Take 1 tablet by mouth daily. (Patient not taking: Reported on 04/05/2021)    ? lisdexamfetamine (VYVANSE) 70 MG capsule Take 1 capsule (70 mg total) by mouth daily. 30 capsule 0  ? [DISCONTINUED] gabapentin (NEURONTIN) 300 MG capsule Take 1 capsule (300 mg total) by mouth 3 (three) times daily. 60 capsule 1  ? ?No current facility-administered medications on file prior to visit.  ? ?Allergies:  ?No Known Allergies ?Medical History:  ?He has History of testicular cancer; Asthma; HYPERTENSION, HEART CONTROLLED W/O ASSOC CHF; CARDIOMYOPATHY, SECONDARY; SYNCOPE; Congestive heart failure, unspecified; Paroxysmal ventricular tachycardia ([place]); S/P implantation of automatic cardioverter/defibrillator (AICD); Chronic systolic CHF (congestive heart failure) ([place]); Atrial  fibrillation, unspecified; Ventricular tachycardia ([place]); ICD (implantable cardioverter-defibrillator) discharge; Atrial fibrillation ([place]); Attention deficit hyperactivity disorder (ADHD); Ulnar neuropathy of l

## 2021-04-05 NOTE — Progress Notes (Deleted)
Complete Physical  Assessment and Plan:     Discussed med's effects and SE's. Screening labs and tests as requested with regular follow-up as recommended. Over 40 minutes of exam, counseling, chart review, and complex, high level critical decision making was performed this visit.   Future Appointments  Date Time Provider Department Center  04/05/2021  2:00 PM Adela Glimpse, NP [facility] None  04/26/2021 11:30 AM Myra Rude, MD [facility]  05/17/2021  1:00 PM Cottle, Steva Ready., MD [facility] None  06/28/2021  8:10 AM [facility] DEVICE REMOTES [facility]  09/27/2021  8:10 AM [facility] DEVICE REMOTES [facility]    HPI  51 y.o. male  presents for a complete physical. He has History of testicular cancer; ASTHMA; HYPERTENSION, HEART CONTROLLED W/O ASSOC CHF; CARDIOMYOPATHY, SECONDARY; SYNCOPE; Congestive heart failure, unspecified; Paroxysmal ventricular tachycardia (HCC); S/P implantation of automatic cardioverter/defibrillator (AICD); Chronic systolic CHF (congestive heart failure) (HCC); Atrial fibrillation, unspecified; Ventricular tachycardia (HCC); ICD (implantable cardioverter-defibrillator) discharge; Atrial fibrillation (HCC); Attention deficit hyperactivity disorder (ADHD); Ulnar neuropathy of left upper extremity; Acute idiopathic gout involving toe of left foot; Cervical strain; Sternoclavicular joint pain, left; Strain of flexor pollicis longus tendon; and Arthritis of carpometacarpal (CMC) joint of left thumb on their problem list.     BMI is There is no height or weight on file to calculate BMI., he {HAS HAS ONG:29528} been working on diet and exercise. Wt Readings from Last 3 Encounters:  04/01/21 212 lb (96.2 kg)  11/30/20 207 lb (93.9 kg)  01/29/20 207 lb (93.9 kg)   His blood pressure {HAS HAS NOT:18834} been controlled at home, today their BP is   He {DOES_DOES UXL:24401} workout. He denies chest pain, shortness of breath, dizziness.  He  {ACTION; IS/IS UUV:25366440} on cholesterol medication and denies myalgias. His cholesterol {ACTION; IS/IS NOT:21021397} at goal. The cholesterol last visit was:   Lab Results  Component Value Date   CHOL 200 03/02/2018   HDL 63 03/02/2018   LDLCALC 103 (H) 03/02/2018   TRIG 172 (H) 03/02/2018   CHOLHDL 3.2 03/02/2018    He {Has/has not:18111} been working on diet and exercise for {ACTION; ABNORMAL GLUCOSE/PREDIABETES/T2DM:21021397}, he {ACTION; IS/IS NOT:21021397} on bASA, he {ACTION; IS/IS HKV:42595638} on ACE/ARB and denies {Symptoms; diabetes w/o none:19199}. Last A1C in the office was:  Lab Results  Component Value Date   HGBA1C  02/05/2010    5.4 (NOTE)                                                                       According to the ADA Clinical Practice Recommendations for 2011, when HbA1c is used as a screening test:   >=6.5%   Diagnostic of Diabetes Mellitus           (if abnormal result  is confirmed)  5.7-6.4%   Increased risk of developing Diabetes Mellitus  References:Diagnosis and Classification of Diabetes Mellitus,Diabetes Care,2011,34(Suppl 1):S62-S69 and Standards of Medical Care in         Diabetes - 2011,Diabetes Care,2011,34  (Suppl 1):S11-S61.   Last GFR: No results found for: EGFR Patient is on Vitamin D supplement.   No results found for: VD25OH   Last PSA was: No results found for: PSA   Current Medications:  Current Outpatient Medications on File Prior to Visit  Medication Sig Dispense Refill   albuterol (PROVENTIL HFA;VENTOLIN HFA) 108 (90 BASE) MCG/ACT inhaler Inhale 2 puffs into the lungs 4 (four) times daily as needed for wheezing. 1 Inhaler 5   allopurinol (ZYLOPRIM) 100 MG tablet Take 1 tablet by mouth daily 90 tablet 1   amphetamine-dextroamphetamine (ADDERALL) 10 MG tablet Take 1 tablet (10 mg total) by mouth daily in the afternoon.  ***01/11/2021 30 tablet 0   aspirin 81 MG tablet Take 81 mg by mouth daily.     carvedilol (COREG) 12.5 MG  tablet Take 1 tablet by mouth 2 (two) times daily. Please make yearly appt. 180 tablet 3   Coenzyme Q10 (CO Q 10 PO) Take 1 tablet by mouth daily.     fish oil-omega-3 fatty acids 1000 MG capsule Take 1 g by mouth daily.      Glucosamine-Chondroit-Vit C-Mn (GLUCOSAMINE 1500 COMPLEX PO) Take 1 tablet by mouth daily.     lisdexamfetamine (VYVANSE) 70 MG capsule Take 1 capsule (70 mg total) by mouth daily. 30 capsule 0   lisdexamfetamine (VYVANSE) 70 MG capsule Take 1 capsule (70 mg total) by mouth daily. 30 capsule 0   MAGNESIUM OXIDE PO Take by mouth daily. Helps with sleep     Multiple Vitamin (MULTIVITAMIN WITH MINERALS) TABS tablet Take 1 tablet by mouth daily.     predniSONE (DELTASONE) 20 MG tablet TAKE 3 TABLETS BY MOUTH ONCE DAILY FOR 3 DAYS-- 2 TABLETS DAILY FOR 3 DAYS-- 1 TABLET DAILY FOR 2 DAYS-- 1/2 TABLET DAILY FOR 2 DAYS 18 tablet 0   sacubitril-valsartan (ENTRESTO) 24-26 MG Take 1 tablet by mouth 2 (two) times daily. 180 tablet 1   sildenafil (VIAGRA) 50 MG tablet Take 1 tablet (50 mg total) by mouth daily as needed for erectile dysfunction. Please call for office visit [phone] 6 tablet 2   sotalol (BETAPACE) 80 MG tablet Take 1 tablet by mouth every 12 (twelve) hours. Please make yearly appt. 180 tablet 3   spironolactone (ALDACTONE) 25 MG tablet TAKE 1 TABLET BY MOUTH ONCE DAILY 30 tablet 6   Turmeric 500 MG CAPS Take 1,500 mg by mouth daily.      UNABLE TO FIND Take 2 tablets by mouth daily. Lions mane mushroom extract     [DISCONTINUED] gabapentin (NEURONTIN) 300 MG capsule Take 1 capsule (300 mg total) by mouth 3 (three) times daily. 60 capsule 1   No current facility-administered medications on file prior to visit.   Allergies:  No Known Allergies Medical History:  He has History of testicular cancer; ASTHMA; HYPERTENSION, HEART CONTROLLED W/O ASSOC CHF; CARDIOMYOPATHY, SECONDARY; SYNCOPE; Congestive heart failure, unspecified; Paroxysmal ventricular tachycardia (HCC);  S/P implantation of automatic cardioverter/defibrillator (AICD); Chronic systolic CHF (congestive heart failure) (HCC); Atrial fibrillation, unspecified; Ventricular tachycardia (HCC); ICD (implantable cardioverter-defibrillator) discharge; Atrial fibrillation (HCC); Attention deficit hyperactivity disorder (ADHD); Ulnar neuropathy of left upper extremity; Acute idiopathic gout involving toe of left foot; Cervical strain; Sternoclavicular joint pain, left; Strain of flexor pollicis longus tendon; and Arthritis of carpometacarpal (CMC) joint of left thumb on their problem list.  Health Maintenance:   Immunization History  Administered Date(s) Administered   Tetanus 10/06/2011   Health Maintenance  Topic Date Due   COVID-19 Vaccine (1) Never done   HIV Screening  Never done   Hepatitis C Screening  Never done   Zoster Vaccines- Shingrix (1 of 2) Never done   COLONOSCOPY (Pts 45-24yrs Insurance coverage will need to  be confirmed)  Never done   INFLUENZA VACCINE  08/03/2021   TETANUS/TDAP  10/05/2021   HPV VACCINES  Aged Out    Sexually Active: {YES NO:22349} STD testing offered  Colonoscopy: EGD:  Last Dental Exam: Last Eye Exam: Last Derm Exam:   Patient Care Team: Patient, No Pcp Per (Inactive) as PCP - General (General Practice)  Surgical History:  He has a past surgical history that includes Orchiectomy (Right, 1998); Wrist fracture surgery; Cardiac catheterization (N/A, 06/30/2014); Cardiac defibrillator placement (2014); and Fracture surgery (Left, ~ 1988). Family History:  Hisfamily history includes Heart disease in his mother; Pancreatic cancer in his maternal grandfather. Social History:  He reports that he has never smoked. He has been exposed to tobacco smoke. He has never used smokeless tobacco. He reports current alcohol use of about 28.0 standard drinks per week. He reports that he does not use drugs.  Review of Systems: ROS  Physical Exam: Estimated body mass  index is 25.14 kg/m as calculated from the following:   Height as of 04/01/21: 6\' 5"  (1.956 m).   Weight as of 04/01/21: 212 lb (96.2 kg). There were no vitals taken for this visit.  General Appearance: Well nourished, well developed, in no apparent distress.  Eyes: PERRLA, EOMs, conjunctiva no swelling or erythema, normal fundi and vessels.  Sinuses: No Frontal/maxillary tenderness  ENT/Mouth: Ext aud canals clear, normal light reflex with TMs without erythema, bulging. Good dentition. No erythema, swelling, or exudate on post pharynx. Tonsils not swollen or erythematous. Hearing normal.  Neck: Supple, thyroid normal. No bruits  Respiratory: Respiratory effort normal, BS equal bilaterally without rales, rhonchi, wheezing or stridor.  Cardio: RRR without murmurs, rubs or gallops. Brisk peripheral pulses without edema.  Chest: symmetric, with normal excursions and percussion.  Abdomen: Soft, nontender, no guarding, rebound, hernias, masses, or organomegaly.  Lymphatics: Non tender without lymphadenopathy.  Musculoskeletal: Full ROM all peripheral extremities,5/5 strength, and normal gait.  Skin: Warm, dry without rashes, lesions, ecchymosis. Neuro: Cranial nerves intact, reflexes equal bilaterally. Normal muscle tone, no cerebellar symptoms. Sensation intact.  Psych: Awake and oriented X 3, normal affect, Insight and Judgment appropriate.  Genitourinary: {genitalia LKGM:0102725}  EKG: WNL no ST changes. AORTA SCAN: WNL

## 2021-04-06 LAB — CBC WITH DIFFERENTIAL/PLATELET
Absolute Monocytes: 403 cells/uL (ref 200–950)
Basophils Absolute: 11 cells/uL (ref 0–200)
Basophils Relative: 0.1 %
Eosinophils Absolute: 32 cells/uL (ref 15–500)
Eosinophils Relative: 0.3 %
HCT: 41.2 % (ref 38.5–50.0)
Hemoglobin: 13.9 g/dL (ref 13.2–17.1)
Lymphs Abs: 1049 cells/uL (ref 850–3900)
MCH: 33.8 pg — ABNORMAL HIGH (ref 27.0–33.0)
MCHC: 33.7 g/dL (ref 32.0–36.0)
MCV: 100.2 fL — ABNORMAL HIGH (ref 80.0–100.0)
MPV: 10.7 fL (ref 7.5–12.5)
Monocytes Relative: 3.8 %
Neutro Abs: 9105 cells/uL — ABNORMAL HIGH (ref 1500–7800)
Neutrophils Relative %: 85.9 %
Platelets: 182 10*3/uL (ref 140–400)
RBC: 4.11 10*6/uL — ABNORMAL LOW (ref 4.20–5.80)
RDW: 12.6 % (ref 11.0–15.0)
Total Lymphocyte: 9.9 %
WBC: 10.6 10*3/uL (ref 3.8–10.8)

## 2021-04-06 LAB — LIPID PANEL
Cholesterol: 209 mg/dL — ABNORMAL HIGH (ref <200)
HDL: 89 mg/dL (ref >=40)
LDL Cholesterol (Calc): 104 mg/dL (calc) — ABNORMAL HIGH
Non-HDL Cholesterol (Calc): 120 mg/dL (calc) (ref <130)
Total CHOL/HDL Ratio: 2.3 (calc) (ref <5.0)
Triglycerides: 74 mg/dL (ref <150)

## 2021-04-06 LAB — COMPLETE METABOLIC PANEL WITH GFR
AG Ratio: 1.9 (calc) (ref 1.0–2.5)
ALT: 18 U/L (ref 9–46)
AST: 17 U/L (ref 10–35)
Albumin: 4.4 g/dL (ref 3.6–5.1)
Alkaline phosphatase (APISO): 32 U/L — ABNORMAL LOW (ref 35–144)
BUN: 20 mg/dL (ref 7–25)
CO2: 26 mmol/L (ref 20–32)
Calcium: 9.4 mg/dL (ref 8.6–10.3)
Chloride: 103 mmol/L (ref 98–110)
Creat: 1.11 mg/dL (ref 0.70–1.30)
Globulin: 2.3 g/dL (calc) (ref 1.9–3.7)
Glucose, Bld: 98 mg/dL (ref 65–99)
Potassium: 4.5 mmol/L (ref 3.5–5.3)
Sodium: 139 mmol/L (ref 135–146)
Total Bilirubin: 0.6 mg/dL (ref 0.2–1.2)
Total Protein: 6.7 g/dL (ref 6.1–8.1)
eGFR: 80 mL/min/{1.73_m2} (ref >=60)

## 2021-04-06 LAB — TSH: TSH: 2.52 mIU/L (ref 0.40–4.50)

## 2021-04-06 LAB — MAGNESIUM: Magnesium: 2.3 mg/dL (ref 1.5–2.5)

## 2021-04-06 LAB — PSA: PSA: 0.47 ng/mL (ref <=4.00)

## 2021-04-06 LAB — VITAMIN D 25 HYDROXY (VIT D DEFICIENCY, FRACTURES): Vit D, 25-Hydroxy: 35 ng/mL (ref 30–100)

## 2021-04-07 ENCOUNTER — Encounter: Payer: Self-pay | Admitting: Nurse Practitioner

## 2021-04-07 NOTE — Progress Notes (Signed)
Remote ICD transmission.   

## 2021-04-08 ENCOUNTER — Encounter: Payer: Self-pay | Admitting: Nurse Practitioner

## 2021-04-08 NOTE — Progress Notes (Signed)
Complete Physical ? ?Assessment and Plan: ? ?1. Encounter for general adult medical examination with abnormal findings ? ?- CBC with Differential/Platelet ?- COMPLETE METABOLIC PANEL WITH GFR ?- Lipid panel ?- VITAMIN D 25 Hydroxy (Vit-D Deficiency, Fractures) ?- TSH ?- PSA ?- Cologuard ?- Magnesium ? ?2. Paroxysmal atrial fibrillation (HCC)/ICD (implantable cardioverter-defibrillator)discharge/Chronic systolic CHF (congestive heart failure) (HCC)/Hypertension, heart controlled w/o assoc CHF/ ? ?Pacemaker in place and intact. ?BP currently well controlled and at goal. ?Continue ASA, Coreg, Entresto, Betapace, Aldactone, Magnesium ?Cardiology following ? ?3. Mild intermittent asthma, unspecified whether complicated ? ?Reviewed Step Up/Step Down Guidelines. ?No changes to medication at this time.   ?Medication currently effective. ?Patient to monitor for exacerbations.   ?Contact clinic if SABA/Rescue is >x2 week. ?Continue Proventil PRN. ? ?4. History of testicular cancer/Erectile Dysfunction, due to diseases classifed elsewhere  ? ?Continue Viagra ?Urology following. ? ?5. Radial styloid tenosynovitis (de quervain)/Generalized arthritis ? ?Continue Multivitamin, Turmeric, Glucosamine ?OTC analgesic PRN.  ?Prednisone taper PRN. ?Continue wrist brace. ?Rest when flared. ?Ortho following. ? ?6. Screening for colon cancer ? ?- Cologuard ? ?7. Screening for prostate cancer ? ?- PSA ? ? ?8. Elevated triglycerides with high cholesterol. ? ?Continue Omega 3 Fish Oil ?Limit processes meats and trans fats. ?Continue to eat a well balanced diet full of fruits and vegetables.   ? ? ?9.  ADHD ? ?Dosage discussed and currently effective. ?Continue Vyvanse/Adderall. ? ? ?Discussed med's effects and SE's. Screening labs and tests as requested with regular follow-up as recommended. ?Over 40 minutes of exam, counseling, chart review, and complex, high level critical decision making was performed this visit.  ? ?Future Appointments   ?Date Time Provider [place]  ?04/26/2021 11:30 AM Rosemarie Ax, MD [facility]  ?05/17/2021  1:00 PM Cottle, Billey Co., MD [facility] None  ?06/28/2021  8:10 AM [facility] DEVICE REMOTES [facility]  ?09/27/2021  8:10 AM [facility] DEVICE REMOTES [facility]  ? ? ?HPI  ?51 y.o. male  presents for a complete physical. He has History of testicular cancer; Asthma; HYPERTENSION, HEART CONTROLLED W/O ASSOC CHF; CARDIOMYOPATHY, SECONDARY; SYNCOPE; Congestive heart failure, unspecified; Paroxysmal ventricular tachycardia ([place]); S/P implantation of automatic cardioverter/defibrillator (AICD); Chronic systolic CHF (congestive heart failure) ([place]); Atrial fibrillation, unspecified; Ventricular tachycardia ([place]); ICD (implantable cardioverter-defibrillator) discharge; Atrial fibrillation ([place]); Attention deficit hyperactivity disorder (ADHD); Ulnar neuropathy of left upper extremity; Acute idiopathic gout involving toe of left foot; Cervical strain; Sternoclavicular joint pain, left; Strain of flexor pollicis longus tendon; and Arthritis of carpometacarpal (CMC) joint of left thumb on their problem list. ? ?Presents to clinic for annual physical exam.  He follow with Cardiology, Urology, Orthopedics.  Denies any acute cardiovascular symptoms in clinic today. Denies any acute urinary symptoms in clinic today.  Reports occasional "aches and pains."  Reports medication compliance.  Has no additional concerns in clinic.   ? ? ?BMI is Body mass index is 24.45 kg/m?., he has been working on diet and exercise. ?Wt Readings from Last 3 Encounters:  ?04/05/21 206 lb 3.2 oz (93.5 kg)  ?04/01/21 212 lb (96.2 kg)  ?11/30/20 207 lb (93.9 kg)  ? ?His blood pressure has been controlled at home, today their BP is BP: (!) 108/58 ?He does workout by walking approximately 5 mi daily on his feet and his job allows him to lift heavy equipment .  He denies chest pain, shortness of breath, dizziness.  ?He is not  on cholesterol medication and denies myalgias. His cholesterol has been elevated in  the past. The cholesterol last visit was:   ?Lab Results  ?Component Value Date  ? CHOL 200 03/02/2018  ? HDL 63 03/02/2018  ? LDLCALC 103 (H) 03/02/2018  ? TRIG 172 (H) 03/02/2018  ? CHOLHDL 3.2 03/02/2018  ? ? ?He has been working on diet and exercise. He is on bASA, he is on ACE/ARB and denies SE of these medication.. Last A1C in the office was:  ?Lab Results  ?Component Value Date  ? HGBA1C  02/05/2010  ?  5.4 ?(NOTE)                                                                       According to the ADA Clinical Practice Recommendations for 2011, when HbA1c is used as a screening test:   >=6.5%   Diagnostic of Diabetes Mellitus           (if abnormal result ? is confirmed)  5.7-6.4%   Increased risk of developing Diabetes Mellitus  References:Diagnosis and Classification of Diabetes Mellitus,Diabetes JWJX,9147,82(NFAOZ 1):S62-S69 and Standards of Medical Care in         Diabetes - 2011,Diabetes HYQM,5784,69  ?(Suppl 1):S11-S61.  ? ?Last GFR: ?No results found for: EGFR ?Patient is on Vitamin D supplement.   ?No results found for: VD25OH   ?Last PSA was: ?No results found for: PSA ? ? ?Current Medications:  ?Current Outpatient Medications on File Prior to Visit  ?Medication Sig Dispense Refill  ? albuterol (PROVENTIL HFA;VENTOLIN HFA) 108 (90 BASE) MCG/ACT inhaler Inhale 2 puffs into the lungs 4 (four) times daily as needed for wheezing. 1 Inhaler 5  ? allopurinol (ZYLOPRIM) 100 MG tablet Take 1 tablet by mouth daily 90 tablet 1  ? amphetamine-dextroamphetamine (ADDERALL) 10 MG tablet Take 1 tablet (10 mg total) by mouth daily in the afternoon.  01/11/2021 30 tablet 0  ? aspirin 81 MG tablet Take 81 mg by mouth daily.    ? carvedilol (COREG) 12.5 MG tablet Take 1 tablet by mouth 2 (two) times daily. Please make yearly appt. 180 tablet 3  ? fish oil-omega-3 fatty acids 1000 MG capsule Take 1 g by mouth daily.     ?  Glucosamine-Chondroit-Vit C-Mn (GLUCOSAMINE 1500 COMPLEX PO) Take 1 tablet by mouth daily.    ? lisdexamfetamine (VYVANSE) 70 MG capsule Take 1 capsule (70 mg total) by mouth daily. 30 capsule 0  ? MAGNESIUM OXIDE PO Take by mouth daily. Helps with sleep    ? Multiple Vitamin (MULTIVITAMIN WITH MINERALS) TABS tablet Take 1 tablet by mouth daily.    ? predniSONE (DELTASONE) 20 MG tablet TAKE 3 TABLETS BY MOUTH ONCE DAILY FOR 3 DAYS-- 2 TABLETS DAILY FOR 3 DAYS-- 1 TABLET DAILY FOR 2 DAYS-- 1/2 TABLET DAILY FOR 2 DAYS 18 tablet 0  ? sacubitril-valsartan (ENTRESTO) 24-26 MG Take 1 tablet by mouth 2 (two) times daily. 180 tablet 1  ? sildenafil (VIAGRA) 50 MG tablet Take 1 tablet (50 mg total) by mouth daily as needed for erectile dysfunction. Please call for office visit [phone] 6 tablet 2  ? sotalol (BETAPACE) 80 MG tablet Take 1 tablet by mouth every 12 (twelve) hours. Please make yearly appt. 180 tablet 3  ? spironolactone (ALDACTONE) 25 MG tablet TAKE 1  TABLET BY MOUTH ONCE DAILY 30 tablet 6  ? Turmeric 500 MG CAPS Take 1,500 mg by mouth daily.     ? UNABLE TO FIND Take 2 tablets by mouth daily. Lions mane mushroom extract    ? Coenzyme Q10 (CO Q 10 PO) Take 1 tablet by mouth daily. (Patient not taking: Reported on 04/05/2021)    ? lisdexamfetamine (VYVANSE) 70 MG capsule Take 1 capsule (70 mg total) by mouth daily. 30 capsule 0  ? [DISCONTINUED] gabapentin (NEURONTIN) 300 MG capsule Take 1 capsule (300 mg total) by mouth 3 (three) times daily. 60 capsule 1  ? ?No current facility-administered medications on file prior to visit.  ? ?Allergies:  ?No Known Allergies ?Medical History:  ?He has History of testicular cancer; Asthma; HYPERTENSION, HEART CONTROLLED W/O ASSOC CHF; CARDIOMYOPATHY, SECONDARY; SYNCOPE; Congestive heart failure, unspecified; Paroxysmal ventricular tachycardia ([place]); S/P implantation of automatic cardioverter/defibrillator (AICD); Chronic systolic CHF (congestive heart failure) ([place]); Atrial  fibrillation, unspecified; Ventricular tachycardia ([place]); ICD (implantable cardioverter-defibrillator) discharge; Atrial fibrillation ([place]); Attention deficit hyperactivity disorder (ADHD); Ulnar neuropathy of left

## 2021-04-08 NOTE — Patient Instructions (Signed)

## 2021-04-16 ENCOUNTER — Other Ambulatory Visit (HOSPITAL_COMMUNITY): Payer: Self-pay

## 2021-04-16 ENCOUNTER — Other Ambulatory Visit: Payer: Self-pay | Admitting: Psychiatry

## 2021-04-16 DIAGNOSIS — F900 Attention-deficit hyperactivity disorder, predominantly inattentive type: Secondary | ICD-10-CM

## 2021-04-16 MED ORDER — AMPHETAMINE-DEXTROAMPHETAMINE 10 MG PO TABS
10.0000 mg | ORAL_TABLET | Freq: Every day | ORAL | 0 refills | Status: DC
  Filled 2021-04-16: qty 30, 30d supply, fill #0

## 2021-04-16 MED ORDER — LISDEXAMFETAMINE DIMESYLATE 70 MG PO CAPS
70.0000 mg | ORAL_CAPSULE | Freq: Every day | ORAL | 0 refills | Status: DC
  Filled 2021-04-16: qty 30, 30d supply, fill #0

## 2021-04-19 ENCOUNTER — Other Ambulatory Visit: Payer: Self-pay | Admitting: Internal Medicine

## 2021-04-19 ENCOUNTER — Other Ambulatory Visit (HOSPITAL_COMMUNITY): Payer: Self-pay

## 2021-04-19 MED ORDER — ENTRESTO 24-26 MG PO TABS
1.0000 | ORAL_TABLET | Freq: Two times a day (BID) | ORAL | 0 refills | Status: DC
  Filled 2021-04-19: qty 60, 30d supply, fill #0
  Filled 2021-05-17: qty 60, 30d supply, fill #1

## 2021-04-26 ENCOUNTER — Ambulatory Visit: Payer: Self-pay

## 2021-04-26 ENCOUNTER — Ambulatory Visit: Payer: BC Managed Care – PPO | Admitting: Family Medicine

## 2021-04-26 VITALS — BP 116/66 | Ht 77.0 in | Wt 207.0 lb

## 2021-04-26 DIAGNOSIS — S66819A Strain of other specified muscles, fascia and tendons at wrist and hand level, unspecified hand, initial encounter: Secondary | ICD-10-CM | POA: Diagnosis not present

## 2021-04-26 MED ORDER — METHYLPREDNISOLONE ACETATE 40 MG/ML IJ SUSP
20.0000 mg | Freq: Once | INTRAMUSCULAR | Status: AC
  Administered 2021-04-26: 20 mg via INTRAMUSCULAR

## 2021-04-26 NOTE — Addendum Note (Signed)
Addended by: Cresenciano Lick on: 04/26/2021 02:01 PM ? ? Modules accepted: Orders ? ?

## 2021-04-26 NOTE — Assessment & Plan Note (Signed)
Acutely worsening.  Still has significant fluid and pain in the area ?-Counseled on home exercise therapy and supportive care. ?-Injection today. ?-Counseled on brace. ?-Provided work note. ?-Could consider physical therapy or further imaging ?

## 2021-04-26 NOTE — Patient Instructions (Signed)
Good to see you ?Please use ice as needed  ?Please use the brace if any pain  ?Please send me a message in MyChart with any questions or updates.  ?Please see me back in 4-6 weeks.  ? ?--Dr. Raeford Razor ? ?

## 2021-04-26 NOTE — Progress Notes (Signed)
?  [name] - 51 y.o. male MRN [id number]  Date of birth: [birth year] ? ?SUBJECTIVE:  Including CC & ROS.  ?No chief complaint on file. ? ? ?[name] is a 51 y.o. male that is presenting with acute ongoing right wrist pain.  Got some improvement with the previous prednisone pack but still having pain.  It is limiting his ability to do certain movements at work. ? ? ?Review of Systems ?See HPI  ? ?HISTORY: Past Medical, Surgical, Social, and Family History Reviewed & Updated per EMR.   ?Pertinent Historical Findings include: ? ?Past Medical History:  ?Diagnosis Date  ? ADHD (attention deficit hyperactivity disorder)   ? 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 BiV ICD Insertion CRT-D  ? Asthma   ? ETOH abuse   ? Heart murmur   ? Nonischemic cardiomyopathy ([place])   ? PAF (paroxysmal atrial fibrillation) ([place])   ? Pneumonia ~ 01/2014  ? Syncope   ? episode occured Feb 2011, ICD placed  ? Systolic congestive heart failure ([place])   ? With ejection fraction of 25%, normal coronaries on Cath 02/2010  ? 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[facility]) 1998  ? "right"  ? Ventricular tachycardia ([place])   ? ? ?Past Surgical History:  ?Procedure Laterality Date  ? CARDIAC DEFIBRILLATOR PLACEMENT  2014  ? "single lead; [employer]"  ? EP IMPLANTABLE DEVICE N/A 06/30/2014  ? Procedure: BiV ICD Insertion CRT-D;  Surgeon: Deboraha Sprang, MD;  Location: [place] [facility];  Service: Cardiovascular;  Laterality: N/A;  ? FRACTURE SURGERY Left ~ 1988  ? ORCHIECTOMY Right 1998  ? with abdominal lymph node dissection  ? WRIST FRACTURE SURGERY    ? ? ? ?PHYSICAL EXAM:  ?VS: BP 116/66   Ht '6\' 5"'$  (1.956 m)   Wt 207 lb (93.9 kg)   BMI 24.55 kg/m?  ?Physical Exam ?Gen: NAD, alert, cooperative with exam, well-appearing ?MSK:  ?Neurovascularly intact   ? ?Limited ultrasound: Right wrist: ? ?Effusion noted of the flexor pollicis longus ? ?Summary: Tenosynovitis of the flexor pollicis longus ? ?Ultrasound and interpretation by Clearance Coots, MD ? ? ?Aspiration/Injection Procedure Note ?[name] ?November 17, 1970 ? ?Procedure: Injection ?Indications: Right wrist pain ? ?Procedure Details ?Consent: Risks of procedure as well as the alternatives and risks of each were explained to the (patient/caregiver).  Consent for procedure obtained. ?Time Out: Verified patient identification, verified procedure, site/side was marked, verified correct patient position, special equipment/implants available, medications/allergies/relevent history reviewed, required imaging and test results available.  Performed.  The area was cleaned with iodine and alcohol swabs.   ? ?The right flexor pollicis longus tendon was injected using 1 cc of 1% lidocaine on a 25-gauge 1-1/2 inch needle.  The syringe was switched and a mixture containing 1 cc's of 40 mg Depo-Medrol and 1 cc's of 0.25% bupivacaine was injected.  Ultrasound was used. Images were obtained in long views showing the injection.   ? ? ?A sterile dressing was applied. ? ?Patient did tolerate procedure well. ? ? ? ?ASSESSMENT & PLAN:  ? ?Strain of flexor pollicis longus tendon ?Acutely worsening.  Still has significant fluid and pain in the area ?-Counseled on home exercise therapy and supportive care. ?-Injection today. ?-Counseled on brace. ?-Provided work note. ?-Could consider physical therapy or further imaging ? ? ? ? ?

## 2021-05-11 DIAGNOSIS — Z1211 Encounter for screening for malignant neoplasm of colon: Secondary | ICD-10-CM | POA: Diagnosis not present

## 2021-05-17 ENCOUNTER — Ambulatory Visit (INDEPENDENT_AMBULATORY_CARE_PROVIDER_SITE_OTHER): Payer: BC Managed Care – PPO | Admitting: Psychiatry

## 2021-05-17 ENCOUNTER — Other Ambulatory Visit (HOSPITAL_COMMUNITY): Payer: Self-pay

## 2021-05-17 ENCOUNTER — Other Ambulatory Visit: Payer: Self-pay | Admitting: Nurse Practitioner

## 2021-05-17 ENCOUNTER — Encounter: Payer: Self-pay | Admitting: Psychiatry

## 2021-05-17 DIAGNOSIS — M654 Radial styloid tenosynovitis [de Quervain]: Secondary | ICD-10-CM

## 2021-05-17 DIAGNOSIS — F325 Major depressive disorder, single episode, in full remission: Secondary | ICD-10-CM | POA: Diagnosis not present

## 2021-05-17 DIAGNOSIS — M1990 Unspecified osteoarthritis, unspecified site: Secondary | ICD-10-CM

## 2021-05-17 DIAGNOSIS — F900 Attention-deficit hyperactivity disorder, predominantly inattentive type: Secondary | ICD-10-CM

## 2021-05-17 MED ORDER — AMPHETAMINE-DEXTROAMPHETAMINE 10 MG PO TABS
10.0000 mg | ORAL_TABLET | Freq: Every day | ORAL | 0 refills | Status: DC
  Filled 2021-05-17: qty 30, 30d supply, fill #0

## 2021-05-17 MED ORDER — LISDEXAMFETAMINE DIMESYLATE 70 MG PO CAPS
70.0000 mg | ORAL_CAPSULE | Freq: Every day | ORAL | 0 refills | Status: DC
  Filled 2021-06-15: qty 30, 30d supply, fill #0

## 2021-05-17 MED ORDER — LISDEXAMFETAMINE DIMESYLATE 70 MG PO CAPS
70.0000 mg | ORAL_CAPSULE | Freq: Every day | ORAL | 0 refills | Status: DC
  Filled 2021-07-15: qty 30, 30d supply, fill #0

## 2021-05-17 MED ORDER — LISDEXAMFETAMINE DIMESYLATE 70 MG PO CAPS
70.0000 mg | ORAL_CAPSULE | Freq: Every day | ORAL | 0 refills | Status: DC
  Filled 2021-05-17: qty 30, 30d supply, fill #0

## 2021-05-17 MED ORDER — AMPHETAMINE-DEXTROAMPHETAMINE 10 MG PO TABS
ORAL_TABLET | ORAL | 0 refills | Status: DC
  Filled 2021-07-15: qty 30, 30d supply, fill #0

## 2021-05-17 NOTE — Progress Notes (Signed)
DEMONTRE PADIN ?[id number] ?11-Sep-1970 ?52 y.o. ? ?Subjective:  ? ?Patient ID:  [name] is a 51 y.o. (DOB [birth year]) male. ? ?Chief Complaint:  ?Chief Complaint  ?Patient presents with  ? Follow-up  ? ADD  ? ? ?HPI ?[name] presents to the office today for follow-up of ADD and history of shift work sleep disorder.. ? ?+visit 02/18/19:.  No meds were changed.Continue Vyvanse 70 mg AM and occ Adderall 10 mg each pm. ? ?08/21/19 appt with the following noted: ?Stressful but overall good since here.  No card problems since here.   ?Still doing woodworking.  Redecorated his living room at home.    ?Still on Vyvanse 70 and prn Adderall.  Some split double shifts. ?No SE. ? Patient is under the care of cardiology.  His diagnoses include nonischemic cardiomyopathy, systolic congestive heart failure which is chronic, history of ventricular tachycardia, history of SVT.  He has a implantable defibrillator and pacemaker.  Pacemaker helped.   Cardiology had no concerns regarding stimulant use.  ?Career change decision at the beginning of the year and then Covid hit.  Had to return to [employer].  Has built and made things during Covid and other projects.  Enjoys making things.  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  Been productive.  Daughter has stayed with him since Covid a lot.  51yo.   ?Satisfied with ADD treatment with Vyvanse and occ immediate use Adderall 10 mg on occasion.  Needs this esp on long days.  Had trouble with the holidays with schedule and what to do.  This has prompted him to consider another vocation.  Comfortable rut for 6 years.  Needs to change.  Med wise the schedule has been more erratic requiring short-acting stimulant. ?Plan: no med changes. ? ?02/19/2020 appointment with the following noted: ?Tumultuous several months with good and bad.   ?Health overall OK except AFIB about a month ago.  Previous was a year ago.  Never needed treatment.   Thinks maybe stress triggered it. ?Has GF.  Supportive. ?On the whole anxiety is manageable but a lot of health stress issues this last year.  Ortho injuries Also has gout onset and changed diet. ?Changing jobs.[employer]   ?Plan continue Vyvanse ? ?11/16/2020 appointment with the following noted: ?GF is NP.  Got pt assistance recently and able to get Vyvanse .  Adderall is uneven and more intrusive. Cardiac ouput is better. ?Plan: Continue Vyvanse 70 mg every morning and Adderall 10 mg in the afternoon on long work days ? ?05/17/2021 appointment with the following noted: ?Adderall only on long days or split shifts. ?Tolerating meds well. ?Occ bouts of insomnia.  Sometimes trouble falling asleep ?  Patient reports stable mood and denies depressed or irritable moods.  Patient denies any recent difficulty with anxiety except as usual.  Patient denies difficulty with sleep initiation or maintenance and is better. Denies appetite disturbance.  Patient reports that energy and motivation have been good.  Patient denies any difficulty with concentration.  Patient denies any suicidal ideation. ? ?Past Psychiatric Medication Trials: Vyvanse 70, Concerta, Adderall, Adderall XR, Daytrana,  ?Wellbutrin 300,  ?clonazepam ?Patient in this practice since 2004 treated for ADD ? ?Review of Systems:  ?Review of Systems  ?Cardiovascular:  Positive for palpitations.  ?Neurological:  Negative for dizziness, tremors and weakness.  ?Psychiatric/Behavioral:  Negative for agitation, behavioral problems, confusion, decreased concentration, dysphoric mood, hallucinations, self-injury, sleep disturbance and suicidal ideas. The patient  is not nervous/anxious and is not hyperactive.   ? ?Medications: I have reviewed the patient's current medications. ? ?Current Outpatient Medications  ?Medication Sig Dispense Refill  ? albuterol (PROVENTIL HFA;VENTOLIN HFA) 108 (90 BASE) MCG/ACT inhaler Inhale 2 puffs into the lungs 4 (four) times daily as needed for  wheezing. 1 Inhaler 5  ? allopurinol (ZYLOPRIM) 100 MG tablet Take 1 tablet by mouth daily 90 tablet 1  ? aspirin 81 MG tablet Take 81 mg by mouth daily.    ? carvedilol (COREG) 12.5 MG tablet Take 1 tablet by mouth 2 (two) times daily. Please make yearly appt. 180 tablet 3  ? Coenzyme Q10 (CO Q 10 PO) Take 1 tablet by mouth daily.    ? fish oil-omega-3 fatty acids 1000 MG capsule Take 1 g by mouth daily.     ? Glucosamine-Chondroit-Vit C-Mn (GLUCOSAMINE 1500 COMPLEX PO) Take 1 tablet by mouth daily.    ? MAGNESIUM OXIDE PO Take by mouth daily. Helps with sleep    ? Multiple Vitamin (MULTIVITAMIN WITH MINERALS) TABS tablet Take 1 tablet by mouth daily.    ? predniSONE (DELTASONE) 20 MG tablet TAKE 3 TABLETS BY MOUTH ONCE DAILY FOR 3 DAYS-- 2 TABLETS DAILY FOR 3 DAYS-- 1 TABLET DAILY FOR 2 DAYS-- 1/2 TABLET DAILY FOR 2 DAYS 18 tablet 0  ? sacubitril-valsartan (ENTRESTO) 24-26 MG Take 1 tablet by mouth 2 (two) times daily. Please schedule appointment for future refills.Thank you 120 tablet 0  ? sildenafil (VIAGRA) 50 MG tablet Take 1 tablet by mouth daily as needed for erectile dysfunction. Please call for office visit [phone] 10 tablet 3  ? sotalol (BETAPACE) 80 MG tablet Take 1 tablet by mouth every 12 (twelve) hours. Please make yearly appt. 180 tablet 3  ? spironolactone (ALDACTONE) 25 MG tablet TAKE 1 TABLET BY MOUTH ONCE DAILY 30 tablet 6  ? Turmeric 500 MG CAPS Take 1,500 mg by mouth daily.     ? UNABLE TO FIND Take 2 tablets by mouth daily. Lions mane mushroom extract    ? amphetamine-dextroamphetamine (ADDERALL) 10 MG tablet Take 1 tablet (10 mg total) by mouth daily in the afternoon. 30 tablet 0  ? [START ON 06/14/2021] amphetamine-dextroamphetamine (ADDERALL) 10 MG tablet Take 1/2-1 tab po qd 30 tablet 0  ? lisdexamfetamine (VYVANSE) 70 MG capsule Take 1 capsule (70 mg total) by mouth daily. 30 capsule 0  ? [START ON 06/14/2021] lisdexamfetamine (VYVANSE) 70 MG capsule Take 1 capsule (70 mg total) by  mouth daily. 30 capsule 0  ? [START ON 07/12/2021] lisdexamfetamine (VYVANSE) 70 MG capsule Take 1 capsule (70 mg total) by mouth daily. 30 capsule 0  ? ?No current facility-administered medications for this visit.  ? ? ?Medication Side Effects: None ? ?Allergies: No Known Allergies ? ?Past Medical History:  ?Diagnosis Date  ? ADHD (attention deficit hyperactivity disorder)   ? 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 BiV ICD Insertion CRT-D  ? Asthma   ? ETOH abuse   ? Heart murmur   ? Nonischemic cardiomyopathy ([place])   ? PAF (paroxysmal atrial fibrillation) ([place])   ? Pneumonia ~ 01/2014  ? Syncope   ? episode occured Feb 2011, ICD placed  ? Systolic congestive heart failure ([place])   ? With ejection fraction of 25%, normal coronaries on Cath 02/2010  ? 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[facility]) 1998  ? "right"  ? Ventricular tachycardia ([place])   ? ? ?Family History  ?Problem Relation Age of Onset  ? Heart disease  Mother   ?     mother and sister with valve problems  ? Pancreatic cancer Maternal Grandfather   ? Cardiomyopathy Neg Hx   ? ? ?Social History  ? ?Socioeconomic History  ? Marital status: Soil scientist  ?  Spouse name: Not on file  ? Number of children: Not on file  ? Years of education: Not on file  ? Highest education level: Not on file  ?Occupational History  ? Occupation: Part time Programme researcher, broadcasting/film/video  ?Tobacco Use  ? Smoking status: Never  ?  Passive exposure: Yes  ? Smokeless tobacco: Never  ? Tobacco comments:  ?  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 Vaping Use: Never used  ?Substance and Sexual Activity  ? Alcohol use: Yes  ?  Alcohol/week: 28.0 standard drinks  ?  Types: 7 Glasses of wine, 21 Cans of beer per week  ?  Comment: 06/30/2014 "2-6 beer2 or glasses of wine/day; probably 2/3 of these beers"  ? Drug use: No  ? Sexual activity: Not Currently  ?Other Topics Concern  ? Not on file  ?Social History Narrative  ? Not on file  ? ?Social Determinants  of Health  ? ?Financial Resource Strain: Not on file  ?Food Insecurity: Not on file  ?Transportation Needs: Not on file  ?Physical Activity: Not on file  ?Stress: Not on file  ?Social Connections: Not on file

## 2021-05-18 ENCOUNTER — Other Ambulatory Visit (HOSPITAL_COMMUNITY): Payer: Self-pay

## 2021-05-22 LAB — COLOGUARD: COLOGUARD: NEGATIVE

## 2021-05-24 ENCOUNTER — Other Ambulatory Visit (HOSPITAL_COMMUNITY): Payer: Self-pay

## 2021-05-24 DIAGNOSIS — M25831 Other specified joint disorders, right wrist: Secondary | ICD-10-CM | POA: Diagnosis not present

## 2021-05-24 DIAGNOSIS — M180 Bilateral primary osteoarthritis of first carpometacarpal joints: Secondary | ICD-10-CM | POA: Diagnosis not present

## 2021-05-24 DIAGNOSIS — M79642 Pain in left hand: Secondary | ICD-10-CM | POA: Diagnosis not present

## 2021-05-24 DIAGNOSIS — M79641 Pain in right hand: Secondary | ICD-10-CM | POA: Diagnosis not present

## 2021-05-24 MED ORDER — CELECOXIB 200 MG PO CAPS
ORAL_CAPSULE | ORAL | 0 refills | Status: DC
  Filled 2021-05-24: qty 30, 30d supply, fill #0

## 2021-06-04 DIAGNOSIS — M180 Bilateral primary osteoarthritis of first carpometacarpal joints: Secondary | ICD-10-CM | POA: Diagnosis not present

## 2021-06-04 DIAGNOSIS — M25541 Pain in joints of right hand: Secondary | ICD-10-CM | POA: Diagnosis not present

## 2021-06-07 ENCOUNTER — Ambulatory Visit: Payer: BC Managed Care – PPO | Admitting: Family Medicine

## 2021-06-11 DIAGNOSIS — M180 Bilateral primary osteoarthritis of first carpometacarpal joints: Secondary | ICD-10-CM | POA: Diagnosis not present

## 2021-06-11 DIAGNOSIS — M25541 Pain in joints of right hand: Secondary | ICD-10-CM | POA: Diagnosis not present

## 2021-06-11 DIAGNOSIS — M25542 Pain in joints of left hand: Secondary | ICD-10-CM | POA: Diagnosis not present

## 2021-06-15 ENCOUNTER — Other Ambulatory Visit: Payer: Self-pay | Admitting: Internal Medicine

## 2021-06-15 ENCOUNTER — Other Ambulatory Visit: Payer: Self-pay | Admitting: Psychiatry

## 2021-06-15 ENCOUNTER — Other Ambulatory Visit (HOSPITAL_COMMUNITY): Payer: Self-pay

## 2021-06-15 DIAGNOSIS — F900 Attention-deficit hyperactivity disorder, predominantly inattentive type: Secondary | ICD-10-CM

## 2021-06-15 MED ORDER — ENTRESTO 24-26 MG PO TABS
1.0000 | ORAL_TABLET | Freq: Two times a day (BID) | ORAL | 0 refills | Status: DC
  Filled 2021-06-15: qty 60, 30d supply, fill #0

## 2021-06-15 MED ORDER — AMPHETAMINE-DEXTROAMPHETAMINE 10 MG PO TABS
10.0000 mg | ORAL_TABLET | Freq: Every day | ORAL | 0 refills | Status: DC
  Filled 2021-06-15: qty 30, 30d supply, fill #0

## 2021-06-16 ENCOUNTER — Other Ambulatory Visit (HOSPITAL_COMMUNITY): Payer: Self-pay

## 2021-06-16 MED ORDER — CELECOXIB 200 MG PO CAPS
ORAL_CAPSULE | ORAL | 0 refills | Status: DC
Start: 2021-06-16 — End: 2021-07-15
  Filled 2021-06-16: qty 30, 30d supply, fill #0

## 2021-06-28 ENCOUNTER — Ambulatory Visit (INDEPENDENT_AMBULATORY_CARE_PROVIDER_SITE_OTHER): Payer: Self-pay

## 2021-06-28 DIAGNOSIS — I428 Other cardiomyopathies: Secondary | ICD-10-CM

## 2021-06-29 LAB — CUP PACEART REMOTE DEVICE CHECK
Battery Remaining Longevity: 19 mo
Battery Voltage: 2.9 V
Brady Statistic AP VP Percent: 0.06 %
Brady Statistic AP VS Percent: 0.01 %
Brady Statistic AS VP Percent: 98.47 %
Brady Statistic AS VS Percent: 1.47 %
Brady Statistic RA Percent Paced: 0.06 %
Brady Statistic RV Percent Paced: 1.31 %
Date Time Interrogation Session: 20230626042204
HighPow Impedance: 43 Ohm
HighPow Impedance: 57 Ohm
Implantable Lead Implant Date: 20120208
Implantable Lead Implant Date: 20160627
Implantable Lead Implant Date: 20160627
Implantable Lead Location: 753858
Implantable Lead Location: 753860
Implantable Lead Location: 753860
Implantable Lead Model: 185
Implantable Lead Model: 4598
Implantable Lead Model: 5076
Implantable Lead Serial Number: 345409
Implantable Pulse Generator Implant Date: 20160627
Lead Channel Impedance Value: 342 Ohm
Lead Channel Impedance Value: 361 Ohm
Lead Channel Impedance Value: 361 Ohm
Lead Channel Impedance Value: 399 Ohm
Lead Channel Impedance Value: 399 Ohm
Lead Channel Impedance Value: 418 Ohm
Lead Channel Impedance Value: 418 Ohm
Lead Channel Impedance Value: 475 Ohm
Lead Channel Impedance Value: 646 Ohm
Lead Channel Impedance Value: 646 Ohm
Lead Channel Impedance Value: 665 Ohm
Lead Channel Impedance Value: 703 Ohm
Lead Channel Impedance Value: 703 Ohm
Lead Channel Pacing Threshold Amplitude: 0.375 V
Lead Channel Pacing Threshold Amplitude: 0.5 V
Lead Channel Pacing Threshold Amplitude: 1.875 V
Lead Channel Pacing Threshold Pulse Width: 0.4 ms
Lead Channel Pacing Threshold Pulse Width: 0.4 ms
Lead Channel Pacing Threshold Pulse Width: 0.4 ms
Lead Channel Sensing Intrinsic Amplitude: 2.125 mV
Lead Channel Sensing Intrinsic Amplitude: 2.125 mV
Lead Channel Sensing Intrinsic Amplitude: 5.625 mV
Lead Channel Sensing Intrinsic Amplitude: 5.625 mV
Lead Channel Setting Pacing Amplitude: 1.25 V
Lead Channel Setting Pacing Amplitude: 1.5 V
Lead Channel Setting Pacing Amplitude: 2 V
Lead Channel Setting Pacing Pulse Width: 0.4 ms
Lead Channel Setting Pacing Pulse Width: 0.8 ms
Lead Channel Setting Sensing Sensitivity: 0.45 mV

## 2021-07-15 ENCOUNTER — Other Ambulatory Visit: Payer: Self-pay | Admitting: Psychiatry

## 2021-07-15 ENCOUNTER — Other Ambulatory Visit (HOSPITAL_COMMUNITY): Payer: Self-pay

## 2021-07-15 DIAGNOSIS — F900 Attention-deficit hyperactivity disorder, predominantly inattentive type: Secondary | ICD-10-CM

## 2021-07-15 MED ORDER — CELECOXIB 200 MG PO CAPS
ORAL_CAPSULE | ORAL | 0 refills | Status: DC
  Filled 2021-07-15: qty 30, 30d supply, fill #0

## 2021-07-15 MED ORDER — AMPHETAMINE-DEXTROAMPHETAMINE 10 MG PO TABS
10.0000 mg | ORAL_TABLET | Freq: Every day | ORAL | 0 refills | Status: DC
  Filled 2021-07-15 – 2021-08-12 (×3): qty 30, 30d supply, fill #0

## 2021-07-15 MED ORDER — LISDEXAMFETAMINE DIMESYLATE 70 MG PO CAPS
70.0000 mg | ORAL_CAPSULE | Freq: Every day | ORAL | 0 refills | Status: DC
  Filled 2021-07-15 – 2021-08-12 (×3): qty 30, 30d supply, fill #0

## 2021-07-15 NOTE — Telephone Encounter (Signed)
Filled 6/14 appt 11/20

## 2021-07-16 ENCOUNTER — Other Ambulatory Visit (HOSPITAL_COMMUNITY): Payer: Self-pay

## 2021-07-16 ENCOUNTER — Other Ambulatory Visit: Payer: Self-pay | Admitting: Internal Medicine

## 2021-07-16 MED ORDER — ENTRESTO 24-26 MG PO TABS
1.0000 | ORAL_TABLET | Freq: Two times a day (BID) | ORAL | 0 refills | Status: DC
  Filled 2021-07-16: qty 30, 15d supply, fill #0

## 2021-07-22 NOTE — Progress Notes (Signed)
Remote ICD transmission.   

## 2021-08-08 NOTE — Progress Notes (Deleted)
Cardiology Office Note Date:  08/08/2021  Patient ID:  [name], [name] [birth year], MRN [id number] PCP:  Unk Pinto, MD  Electrophysiologist: Dr. Caryl Comes   Chief Complaint:  overdue visit  History of Present Illness: [name] is a 51 y.o. male with history of NICM, presumed secondary to triple chemotherapy, VT with ICD, SVT, major depressive disorder (follows with [facility])   He saw Dr. Caryl Comes Sept 2016 who noted hx of symptomatic sustained ventricular tachycardia. Discussions were had concerning catheter ablation. It was elected to use sotalol  but that was never initiated.  He has had recurrent prolonged nonsustained ventricular tachycardia; his device has been programmed to minimize therapy for this.  He also carries hx of ETOH abuse, none for a couple years, ADHD, asthma, and CHF.    May 2016, hospital admit for PAFib, single event of AF had been noted appears at that time he was intolerant of amiodarone with side effects, and was started on Sotalol for his VT and AF, no chronic a/c was started.   He comes in today to be seen for Dr. Caryl Comes, last seen by him Aug 2020, he was doing well, reported good exercise tolerance.  Noted to have SVT terminated by antitachycardia pacing.  After discussions with the patient, we have elected to maintain ATP for tachycardia is faster than 170 less than 200 but to turn off shocks in this zone. There is mention of h/o orthostatic lightheadedness that was improved  I saw him 07/25/19: He feels well. He has felt 2x that he had Afib, the most recent in Feb, he is immediately aware of his heart beat, is fast and uncomfortable feeling.  No CP, no dizziness, near syncope or syncope, not overtly SOB but makes him feel weak, poorly. No clear trigger, though perhaps after salty meal. Outside of that he feels like he is in a good place, no CP, SOB, DOE, feels like he has good exertional capacity No near syncope or syncope Tolerating his  medicines. Planned to update labs and echo.  Discussed his CHA2DS2Vasc score (one for HF) and burden, pt was most comfortable proceeding without a/c and following AF burden.  He did not follow through with the echo.  Recently reached out via Mychart with an unusually long AF episode,  And ?? diaphrag stim  I saw him 01/29/20 This is the 1st episode of AF since last year. He seems to recall that last year was as well in the environment of increased "slow burn" stress. What bothers him the most is the intermittent thumping/jumping from his device that he feels when he is in Afib, never feels it otherwise. Mentions early in his device history having the thumping/jumping much more often though programming all but eliminated this unless he is in Afib Outside of the Afib doing well, no CP or SOB, no DOE. No near syncope or syncope. He has found with gout, no other changes or new medical diagnosis' I was unable to reproduce symptoms/diaphragmatic stim No changes to sotalol with low Af burden Not felt to be volume OL Planned to update his echo and f/u   He cancelled and did not want to reschedule echo  I saw him 11/30/20 He insurance has lapsed and this is why he cancelled the echo. He is feeling well, does not think he has had any Afib since his last visit No CP, SOB No near syncope or syncope. No remotes since March, he suspects his transmitter is unplugged He inquires about  a medicine [name] for his HF management He declined EKG he says for financial reasons, we discussed importance of EKG/visits and management while on sotalol.  He was going to come in Jan for an EKG, he wanted to wat to schedule. No remotes and was asked to make sure he had his transmitter plugged in, device clinic to help troubleshoot.  He did not come for jan EKG + remotes  *** meds and sotalol ok???? *** needs sotalol EKG, labs (OK) *** symptoms *** volume *** echo 2018 45% *** meds, CM   DEVICE  information:   MDT, CRT-D, upgrade 06/30/14, Dr. Caryl Comes, original device 2012 secondary prevention/CM  Aug 2015 appropriate shock for VT ICD shock 8/15  ATP failed to terminate VT, 24J shock failed and 35 joule shock was successful  There was associated syncope  AAD Hx Sotalol started 2016    Past Medical History:  Diagnosis Date   ADHD (attention deficit hyperactivity disorder)    AICD (automatic cardioverter/defibrillator) present 06/30/2014   BiV ICD Insertion CRT-D   Asthma    ETOH abuse    Heart murmur    Nonischemic cardiomyopathy ([place])    PAF (paroxysmal atrial fibrillation) ([place])    Pneumonia ~ 01/2014   Syncope    episode occured Feb 2011, ICD placed   Systolic congestive heart failure ([place])    With ejection fraction of 25%, normal coronaries on Cath 02/2010   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[facility]) 1998   "right"   Ventricular tachycardia [facility])     Past Surgical History:  Procedure Laterality Date   CARDIAC DEFIBRILLATOR PLACEMENT  2014   "single lead; Medtronic"   EP IMPLANTABLE DEVICE N/A 06/30/2014   Procedure: BiV ICD Insertion CRT-D;  Surgeon: Deboraha Sprang, MD;  Location: [facility];  Service: Cardiovascular;  Laterality: N/A;   FRACTURE SURGERY Left ~ [place]   with abdominal lymph node dissection   WRIST FRACTURE SURGERY         Allergies:   Patient has no known allergies.   Social History:  The patient  reports that he has never smoked. He has been exposed to tobacco smoke. He has never used smokeless tobacco. He reports current alcohol use of about 28.0 standard drinks of alcohol per week. He reports that he does not use drugs.   Family History:  The patient's family history includes Heart disease in his mother; Pancreatic cancer in his maternal grandfather.  ROS:  Please see the history of present illness.  All other systems are reviewed and otherwise negative.   PHYSICAL EXAM:  VS:  There were no vitals taken for this visit. BMI:  There is no height or weight on file to calculate BMI. Well nourished, well developed, in no acute distress  HEENT: normocephalic, atraumatic  Neck: no JVD, carotid bruits or masses Cardiac:  *** RRR; no significant murmurs, no rubs, or gallops Lungs:  *** CTA b/l, no wheezing, rhonchi or rales  Abd: soft, nontender MS: no deformity or atrophy Ext:  ***  no edema  Skin: warm and dry, no rash Neuro:  No gross deficits appreciated Psych: euthymic mood, full affect  *** ICD site is stable, no tethering or discomfort   EKG:  Done today and reviewed by myself shows  *** Patient declined EKG today SR 67bpm, LAD, icRBBB, QTc 439m 07/25/19: SR 72bpm, PR 1523m QRS 13252mQT 422m72mc 462ms29mstable, appears similr to prior  Device interrogation done today and reviewed by  myself : ***   09/19/2016: TTE Study Conclusions  - Left ventricle: The cavity size was moderately dilated. There was    mild concentric hypertrophy. Systolic function was mildly    reduced. The estimated ejection fraction was in the range of 45%    to 50%. Wall motion was normal; there were no regional wall    motion abnormalities. Doppler parameters are consistent with    abnormal left ventricular relaxation (grade 1 diastolic    dysfunction). There was no evidence of elevated ventricular    filling pressure by Doppler parameters.  - Aortic root: The aortic root was mildly dilated measuring 42 mm.  - Ascending aorta: The ascending aorta was normal in size.  - Mitral valve: There was mild regurgitation.  - Left atrium: The atrium was mildly dilated.  - Right ventricle: Pacer wire or catheter noted in right ventricle.    Systolic function was normal.  - Right atrium: Pacer wire or catheter noted in right atrium.  - Tricuspid valve: There was mild regurgitation.  - Inferior vena cava: The vessel was normal in size.  - Pericardium, extracardiac: There was no pericardial effusion.   Impressions:  - When compared  to the prior study from 05/26/2014 LVEF has improved    from 30% to 45-50% with diffuse hypokinesis. LV remains    moderately dilated.    05/26/14: Echocardiogram Study Conclusions  - Left ventricle: The cavity size was mildly dilated. Wall   thickness was increased in a pattern of mild LVH. The estimated   ejection fraction was 30%. Diffuse hypokinesis. Septal-lateral   dyssynchrony noted. Features are consistent with a pseudonormal   left ventricular filling pattern, with concomitant abnormal   relaxation and increased filling pressure (grade 2 diastolic   dysfunction). - Aortic valve: There was no stenosis. - Aorta: Mildly dilated aortic root. Aortic root dimension: 41 mm   (ED). - Mitral valve: There was trivial regurgitation. - Left atrium: The atrium was mildly dilated. - Right ventricle: The cavity size was normal. Pacer wire or   catheter noted in right ventricle. Systolic function was normal. - Right atrium: The atrium was mildly dilated. - Pulmonary arteries: No complete TR doppler jet so unable to   estimate PA systolic pressure. - Inferior vena cava: The vessel was normal in size. The   respirophasic diameter changes were in the normal range (= 50%),   consistent with normal central venous pressure. Impressions:  - Mildly dilated LV with mild LV hypertrophy. EF 30% with diffuse   hypokinesis and septal-lateral dyssynchrony. Normal RV size and   systolic function. No significant valvular abnormalities.     Recent Labs: 04/05/2021: ALT 18; BUN 20; Creat 1.11; Hemoglobin 13.9; Magnesium 2.3; Platelets 182; Potassium 4.5; Sodium 139; TSH 2.52  04/05/2021: Cholesterol 209; HDL 89; LDL Cholesterol (Calc) 104; Total CHOL/HDL Ratio 2.3; Triglycerides 74   CrCl cannot be calculated (Patient's most recent lab result is older than the maximum 21 days allowed.).   Wt Readings from Last 3 Encounters:  04/26/21 207 lb (93.9 kg)  04/05/21 206 lb 3.2 oz (93.5 kg)  04/01/21 212 lb  (96.2 kg)     Other studies reviewed: Additional studies/records reviewed today include: summarized above   ASSESSMENT AND PLAN:  1. ICD     *** Intact function      *** No programming changes made   2. NICM 3. Chronic CHF     Some improvement in his LVEF by echo in 2018, 45-50%      ***  No symptoms or exam findings of volume OL     *** OptiVol looks great     *** On BB, Entresto, aldactone           4. VT     None noted     *** On sotalol, discussed importance of EKG, labs, though financially asks to wait until January when his insurance is back in force       5. SVT      Fleeting AT      Historically gets success with ATP in this zone (noted previously and programmed by Dr. Caryl Comes)  6. PAFib     CHA2DS2Vasc remains one.      *** 0.1%  Disposition: ***   Current medicines are reviewed at length with the patient today.  The patient did not have any concerns regarding medicines.  Haywood Lasso, PA-C 08/08/2021 3:21 PM     [address] [place] [address] [phone] (office)  [phone] (fax)

## 2021-08-11 ENCOUNTER — Encounter: Payer: BC Managed Care – PPO | Admitting: Physician Assistant

## 2021-08-11 ENCOUNTER — Other Ambulatory Visit: Payer: Self-pay | Admitting: Family Medicine

## 2021-08-11 ENCOUNTER — Other Ambulatory Visit (HOSPITAL_COMMUNITY): Payer: Self-pay

## 2021-08-11 MED ORDER — ALLOPURINOL 100 MG PO TABS
100.0000 mg | ORAL_TABLET | Freq: Every day | ORAL | 1 refills | Status: DC
Start: 2021-08-11 — End: 2021-08-20
  Filled 2021-08-11: qty 90, 90d supply, fill #0

## 2021-08-12 ENCOUNTER — Other Ambulatory Visit (HOSPITAL_COMMUNITY): Payer: Self-pay

## 2021-08-16 ENCOUNTER — Other Ambulatory Visit (HOSPITAL_COMMUNITY): Payer: Self-pay

## 2021-08-17 ENCOUNTER — Other Ambulatory Visit (HOSPITAL_COMMUNITY): Payer: Self-pay

## 2021-08-20 ENCOUNTER — Other Ambulatory Visit: Payer: Self-pay

## 2021-08-20 ENCOUNTER — Ambulatory Visit (INDEPENDENT_AMBULATORY_CARE_PROVIDER_SITE_OTHER): Payer: BC Managed Care – PPO | Admitting: Student

## 2021-08-20 ENCOUNTER — Other Ambulatory Visit: Payer: Self-pay | Admitting: Psychiatry

## 2021-08-20 ENCOUNTER — Telehealth: Payer: Self-pay | Admitting: Psychiatry

## 2021-08-20 ENCOUNTER — Other Ambulatory Visit (HOSPITAL_COMMUNITY): Payer: Self-pay

## 2021-08-20 ENCOUNTER — Encounter: Payer: Self-pay | Admitting: Student

## 2021-08-20 VITALS — BP 116/80 | HR 64 | Ht 77.0 in | Wt 208.6 lb

## 2021-08-20 DIAGNOSIS — I471 Supraventricular tachycardia: Secondary | ICD-10-CM

## 2021-08-20 DIAGNOSIS — I472 Ventricular tachycardia, unspecified: Secondary | ICD-10-CM

## 2021-08-20 DIAGNOSIS — I480 Paroxysmal atrial fibrillation: Secondary | ICD-10-CM

## 2021-08-20 DIAGNOSIS — I428 Other cardiomyopathies: Secondary | ICD-10-CM

## 2021-08-20 LAB — BASIC METABOLIC PANEL
BUN/Creatinine Ratio: 14 (ref 9–20)
BUN: 15 mg/dL (ref 6–24)
CO2: 25 mmol/L (ref 20–29)
Calcium: 9.3 mg/dL (ref 8.7–10.2)
Chloride: 101 mmol/L (ref 96–106)
Creatinine, Ser: 1.05 mg/dL (ref 0.76–1.27)
Glucose: 79 mg/dL (ref 70–99)
Potassium: 4.2 mmol/L (ref 3.5–5.2)
Sodium: 140 mmol/L (ref 134–144)
eGFR: 85 mL/min/{1.73_m2} (ref >59)

## 2021-08-20 LAB — CUP PACEART INCLINIC DEVICE CHECK
Battery Remaining Longevity: 18 mo
Battery Voltage: 2.89 V
Brady Statistic AP VP Percent: 0.08 %
Brady Statistic AP VS Percent: 0.01 %
Brady Statistic AS VP Percent: 98.45 %
Brady Statistic AS VS Percent: 1.47 %
Brady Statistic RA Percent Paced: 0.09 %
Brady Statistic RV Percent Paced: 1.49 %
Date Time Interrogation Session: 20230818101509
HighPow Impedance: 46 Ohm
HighPow Impedance: 63 Ohm
Implantable Lead Implant Date: 20120208
Implantable Lead Implant Date: 20160627
Implantable Lead Implant Date: 20160627
Implantable Lead Location: 753858
Implantable Lead Location: 753860
Implantable Lead Location: 753860
Implantable Lead Model: 185
Implantable Lead Model: 4598
Implantable Lead Model: 5076
Implantable Lead Serial Number: 345409
Implantable Pulse Generator Implant Date: 20160627
Lead Channel Impedance Value: 361 Ohm
Lead Channel Impedance Value: 361 Ohm
Lead Channel Impedance Value: 399 Ohm
Lead Channel Impedance Value: 399 Ohm
Lead Channel Impedance Value: 399 Ohm
Lead Channel Impedance Value: 418 Ohm
Lead Channel Impedance Value: 418 Ohm
Lead Channel Impedance Value: 475 Ohm
Lead Channel Impedance Value: 646 Ohm
Lead Channel Impedance Value: 646 Ohm
Lead Channel Impedance Value: 665 Ohm
Lead Channel Impedance Value: 665 Ohm
Lead Channel Impedance Value: 665 Ohm
Lead Channel Pacing Threshold Amplitude: 0.375 V
Lead Channel Pacing Threshold Amplitude: 0.5 V
Lead Channel Pacing Threshold Amplitude: 1.875 V
Lead Channel Pacing Threshold Pulse Width: 0.4 ms
Lead Channel Pacing Threshold Pulse Width: 0.4 ms
Lead Channel Pacing Threshold Pulse Width: 0.4 ms
Lead Channel Sensing Intrinsic Amplitude: 1.25 mV
Lead Channel Sensing Intrinsic Amplitude: 1.5 mV
Lead Channel Sensing Intrinsic Amplitude: 5.75 mV
Lead Channel Sensing Intrinsic Amplitude: 8 mV
Lead Channel Setting Pacing Amplitude: 1.25 V
Lead Channel Setting Pacing Amplitude: 1.5 V
Lead Channel Setting Pacing Amplitude: 2 V
Lead Channel Setting Pacing Pulse Width: 0.4 ms
Lead Channel Setting Pacing Pulse Width: 0.8 ms
Lead Channel Setting Sensing Sensitivity: 0.45 mV

## 2021-08-20 LAB — MAGNESIUM: Magnesium: 2.2 mg/dL (ref 1.6–2.3)

## 2021-08-20 MED ORDER — LISDEXAMFETAMINE DIMESYLATE 20 MG PO CAPS
20.0000 mg | ORAL_CAPSULE | Freq: Every day | ORAL | 0 refills | Status: DC
Start: 2021-08-20 — End: 2021-11-18
  Filled 2021-08-20 – 2021-10-20 (×2): qty 30, 30d supply, fill #0

## 2021-08-20 MED ORDER — LISDEXAMFETAMINE DIMESYLATE 50 MG PO CAPS
50.0000 mg | ORAL_CAPSULE | Freq: Every day | ORAL | 0 refills | Status: DC
  Filled 2021-08-20 – 2021-10-20 (×2): qty 30, 30d supply, fill #0

## 2021-08-20 NOTE — Telephone Encounter (Signed)
Yes.. we need to do this.  Problem should be resolved within 30 days

## 2021-08-20 NOTE — Patient Instructions (Signed)
Medication Instructions:  Your physician recommends that you continue on your current medications as directed. Please refer to the Current Medication list given to you today.  *If you need a refill on your cardiac medications before your next appointment, please call your pharmacy*   Lab Work: TODAY: BMET, Mag  If you have labs (blood work) drawn today and your tests are completely normal, you will receive your results only by: [place] (if you have MyChart) OR A paper copy in the mail If you have any lab test that is abnormal or we need to change your treatment, we will call you to review the results.   Follow-Up: At [facility], you and your health needs are our priority.  As part of our continuing mission to provide you with exceptional heart care, we have created designated Provider Care Teams.  These Care Teams include your primary Cardiologist (physician) and Advanced Practice Providers (APPs -  Physician Assistants and Nurse Practitioners) who all work together to provide you with the care you need, when you need it.   Your next appointment:   6 month(s)  The format for your next appointment:   In Person  Provider:   Virl Axe, MD

## 2021-08-20 NOTE — Telephone Encounter (Signed)
Pt LVM @ 1:21p.  He is not able to find the Vyvanse '70mg'$ .  He wants to know if you will write a script for '50mg'$  + '20mg'$  for this month.  [facility] has both available.  Next appt 11/20

## 2021-08-20 NOTE — Telephone Encounter (Signed)
Pended.

## 2021-08-20 NOTE — Progress Notes (Signed)
Electrophysiology Office Note Date: 08/20/2021  ID:  [name], DOB [birth year], MRN [id number]  PCP: Unk Pinto, MD Primary Cardiologist: None Electrophysiologist: Virl Axe, MD   CC: Routine ICD follow-up  [name] is a 51 y.o. male seen today for Virl Axe, MD for routine electrophysiology followup.  Since last being seen in our clinic the patient reports doing very well.  he denies chest pain, palpitations, dyspnea, PND, orthopnea, nausea, vomiting, dizziness, syncope, edema, weight gain, or early satiety. He has not had ICD shocks.   Device History: MDT, CRT-D, upgrade 06/30/14, Dr. Caryl Comes, original device 2012 secondary prevention/CM   Aug 2015 appropriate shock for VT ICD shock 8/15  ATP failed to terminate VT, 24J shock failed and 35 joule shock was successful  There was associated syncope   AAD Hx Sotalol started 2016  Past Medical History:  Diagnosis Date   ADHD (attention deficit hyperactivity disorder)    AICD (automatic cardioverter/defibrillator) present 06/30/2014   BiV ICD Insertion CRT-D   Asthma    ETOH abuse    Heart murmur    Nonischemic cardiomyopathy ([place])    PAF (paroxysmal atrial fibrillation) ([place])    Pneumonia ~ 01/2014   Syncope    episode occured Feb 2011, ICD placed   Systolic congestive heart failure ([place])    With ejection fraction of 25%, normal coronaries on Cath 02/2010   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[facility]) 1998   "right"   Ventricular tachycardia [facility])    Past Surgical History:  Procedure Laterality Date   CARDIAC DEFIBRILLATOR PLACEMENT  2014   "single lead; Medtronic"   EP IMPLANTABLE DEVICE N/A 06/30/2014   Procedure: BiV ICD Insertion CRT-D;  Surgeon: Deboraha Sprang, MD;  Location: [place] [facility];  Service: Cardiovascular;  Laterality: N/A;   FRACTURE SURGERY Left ~ [place]   with abdominal lymph node dissection   WRIST FRACTURE SURGERY      Current Outpatient Medications   Medication Sig Dispense Refill   albuterol (PROVENTIL HFA;VENTOLIN HFA) 108 (90 BASE) MCG/ACT inhaler Inhale 2 puffs into the lungs 4 (four) times daily as needed for wheezing. 1 Inhaler 5   allopurinol (ZYLOPRIM) 100 MG tablet Take 100 mg by mouth as needed (for ADD).     amphetamine-dextroamphetamine (ADDERALL) 10 MG tablet Take 1/2 to 1 tablet by mouth once daily 30 tablet 0   aspirin 81 MG tablet Take 81 mg by mouth daily.     carvedilol (COREG) 12.5 MG tablet Take 1 tablet by mouth 2 (two) times daily. Please make yearly appt. 180 tablet 3   celecoxib (CELEBREX) 200 MG capsule Take 1 capsule (200 mg total) by mouth daily with food 30 capsule 0   Coenzyme Q10 (CO Q 10 PO) Take 1 tablet by mouth daily.     fish oil-omega-3 fatty acids 1000 MG capsule Take 1 g by mouth daily.      Glucosamine-Chondroit-Vit C-Mn (GLUCOSAMINE 1500 COMPLEX PO) Take 1 tablet by mouth daily.     lisdexamfetamine (VYVANSE) 70 MG capsule Take 1 capsule by mouth daily. 30 capsule 0   MAGNESIUM OXIDE PO Take by mouth daily. Helps with sleep     Multiple Vitamin (MULTIVITAMIN WITH MINERALS) TABS tablet Take 1 tablet by mouth daily.     predniSONE (DELTASONE) 20 MG tablet TAKE 3 TABLETS BY MOUTH ONCE DAILY FOR 3 DAYS-- 2 TABLETS DAILY FOR 3 DAYS-- 1 TABLET DAILY FOR 2 DAYS-- 1/2 TABLET DAILY FOR 2 DAYS  18 tablet 0   sacubitril-valsartan (ENTRESTO) 24-26 MG Take 1 tablet by mouth 2 (two) times daily. Please keep upcoming appointment for future refills. Thank you. 2ND ATTEMPT. 30 tablet 0   sildenafil (VIAGRA) 50 MG tablet Take 1 tablet by mouth daily as needed for erectile dysfunction. Please call for office visit [phone] 10 tablet 3   sotalol (BETAPACE) 80 MG tablet Take 1 tablet by mouth every 12 (twelve) hours. Please make yearly appt. 180 tablet 3   spironolactone (ALDACTONE) 25 MG tablet TAKE 1 TABLET BY MOUTH ONCE DAILY 30 tablet 6   Turmeric 500 MG CAPS Take 1,500 mg by mouth daily.      UNABLE TO FIND Take 2  tablets by mouth daily. Lions mane mushroom extract     No current facility-administered medications for this visit.    Allergies:   Patient has no known allergies.   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"  Vaping Use   Vaping Use: Never used  Substance and Sexual Activity   Alcohol use: Yes    Alcohol/week: 28.0 standard drinks of alcohol    Types: 7 Glasses of wine, 21 Cans of beer per week    Comment: 06/30/2014 "2-6 beer2 or glasses of wine/day; probably 2/3 of these beers"   Drug use: No   Sexual activity: Not Currently  Other Topics Concern   Not on file  Social History Narrative   Not on file   Social Determinants of Health   Financial Resource Strain: Not on file  Food Insecurity: Not on file  Transportation Needs: Not on file  Physical Activity: Not on file  Stress: Not on file  Social Connections: Not on file  Intimate Partner Violence: Not on file    Family History: Family History  Problem Relation Age of Onset   Heart disease Mother        mother and sister with valve problems   Pancreatic cancer Maternal Grandfather    Cardiomyopathy Neg Hx     Review of Systems: All other systems reviewed and are otherwise negative except as noted above.   Physical Exam: Vitals:   08/20/21 0945  BP: 116/80  Pulse: 64  SpO2: 97%  Weight: 208 lb 9.6 oz (94.6 kg)  Height: '6\' 5"'$  (1.956 m)     GEN- The patient is well appearing, alert and oriented x 3 today.   HEENT: normocephalic, atraumatic; sclera clear, conjunctiva pink; hearing  intact; oropharynx clear; neck supple, no JVP Lymph- no cervical lymphadenopathy Lungs- Clear to ausculation bilaterally, normal work of breathing.  No wheezes, rales, rhonchi Heart- Regular rate and rhythm, no murmurs, rubs or gallops, PMI not laterally displaced GI- soft, non-tender, non-distended, bowel sounds present, no hepatosplenomegaly Extremities- no clubbing or cyanosis. No edema; DP/PT/radial pulses 2+ bilaterally MS- no significant deformity or atrophy Skin- warm and dry, no rash or lesion; ICD pocket well healed Psych- euthymic mood, full affect Neuro- strength and sensation are intact  ICD interrogation- reviewed  in detail today,  See PACEART report  EKG:  EKG is ordered today. Personal review of EKG ordered today shows NSR at 64 bpm, stable QTc  Recent Labs: 04/05/2021: ALT 18; BUN 20; Creat 1.11; Hemoglobin 13.9; Magnesium 2.3; Platelets 182; Potassium 4.5; Sodium 139; TSH 2.52   Wt Readings from Last 3 Encounters:  08/20/21 208 lb 9.6 oz (94.6 kg)  04/26/21 207 lb (93.9 kg)  04/05/21 206 lb 3.2 oz (93.5 kg)     Other studies Reviewed: Additional studies/ records that were reviewed today include: Previous EP office notes.   Assessment and Plan:  1.  Chronic systolic dysfunction s/p Medtronic CRT-D  euvolemic today Stable on an appropriate medical regimen Normal ICD function See Pace Art report No changes today  2. VT Cotninue sotalol EKG today shows NSR with stable QTc Surveillance labs today  3. SVT Rare AT Programmed to get ATP in this zone which has previously been effective  4. Paroxysmal Atrial Fibrillation  Not on [place] with CHA2DS2VASC of 1. Continue to monitor burden through device. Historically <1%   Current medicines are reviewed at length with the patient today.   =  Labs/ tests ordered today include:  Orders Placed This Encounter  Procedures   Basic metabolic panel   Magnesium   CUP PACEART INCLINIC DEVICE CHECK   EKG 12-Lead      Disposition:   Follow up with Dr. Caryl Comes in 6 months    Signed, Shirley Friar, PA-C  08/20/2021 10:18 AM  [place] [address] [place] [address] [phone] (office) [phone] (fax)

## 2021-08-20 NOTE — Telephone Encounter (Signed)
Please advise.We would most likely have to do a PA and may not get approved on time or at all

## 2021-08-21 ENCOUNTER — Other Ambulatory Visit (HOSPITAL_COMMUNITY): Payer: Self-pay

## 2021-08-21 MED ORDER — LISDEXAMFETAMINE DIMESYLATE 20 MG PO CAPS
20.0000 mg | ORAL_CAPSULE | Freq: Every day | ORAL | 0 refills | Status: DC
  Filled 2021-08-21: qty 30, 30d supply, fill #0

## 2021-08-21 MED ORDER — LISDEXAMFETAMINE DIMESYLATE 50 MG PO CAPS
50.0000 mg | ORAL_CAPSULE | Freq: Every day | ORAL | 0 refills | Status: DC
  Filled 2021-08-21: qty 30, 30d supply, fill #0

## 2021-08-23 ENCOUNTER — Other Ambulatory Visit (HOSPITAL_COMMUNITY): Payer: Self-pay

## 2021-08-23 ENCOUNTER — Telehealth: Payer: Self-pay

## 2021-08-23 NOTE — Telephone Encounter (Signed)
PA for vyvanse 50 and 20 mg #30 each has been submitted and approved by [facility] through cover my meds. Effective Dates:08/23/2021 through 08/22/2022.

## 2021-08-27 ENCOUNTER — Other Ambulatory Visit (HOSPITAL_COMMUNITY): Payer: Self-pay

## 2021-08-27 ENCOUNTER — Other Ambulatory Visit: Payer: Self-pay | Admitting: Internal Medicine

## 2021-08-27 MED ORDER — ENTRESTO 24-26 MG PO TABS
1.0000 | ORAL_TABLET | Freq: Two times a day (BID) | ORAL | 3 refills | Status: DC
  Filled 2021-08-27: qty 180, 90d supply, fill #0
  Filled 2021-12-21: qty 180, 90d supply, fill #1
  Filled 2022-03-18: qty 180, 90d supply, fill #2

## 2021-08-28 ENCOUNTER — Other Ambulatory Visit (HOSPITAL_COMMUNITY): Payer: Self-pay

## 2021-08-30 ENCOUNTER — Other Ambulatory Visit (HOSPITAL_COMMUNITY): Payer: Self-pay

## 2021-08-30 MED ORDER — CELECOXIB 200 MG PO CAPS
ORAL_CAPSULE | ORAL | 0 refills | Status: DC
  Filled 2021-08-30: qty 30, 30d supply, fill #0

## 2021-09-16 ENCOUNTER — Other Ambulatory Visit (HOSPITAL_COMMUNITY): Payer: Self-pay

## 2021-09-17 ENCOUNTER — Other Ambulatory Visit (HOSPITAL_COMMUNITY): Payer: Self-pay

## 2021-09-20 ENCOUNTER — Other Ambulatory Visit (HOSPITAL_COMMUNITY): Payer: Self-pay

## 2021-09-20 ENCOUNTER — Other Ambulatory Visit: Payer: Self-pay | Admitting: Psychiatry

## 2021-09-20 MED ORDER — LISDEXAMFETAMINE DIMESYLATE 50 MG PO CAPS
50.0000 mg | ORAL_CAPSULE | Freq: Every day | ORAL | 0 refills | Status: DC
  Filled 2021-09-20: qty 30, 30d supply, fill #0

## 2021-09-20 MED ORDER — LISDEXAMFETAMINE DIMESYLATE 20 MG PO CAPS
20.0000 mg | ORAL_CAPSULE | Freq: Every day | ORAL | 0 refills | Status: DC
  Filled 2021-09-20: qty 30, 30d supply, fill #0

## 2021-09-21 ENCOUNTER — Other Ambulatory Visit (HOSPITAL_COMMUNITY): Payer: Self-pay

## 2021-09-23 ENCOUNTER — Other Ambulatory Visit (HOSPITAL_COMMUNITY): Payer: Self-pay

## 2021-09-27 ENCOUNTER — Ambulatory Visit (INDEPENDENT_AMBULATORY_CARE_PROVIDER_SITE_OTHER): Payer: BC Managed Care – PPO

## 2021-09-27 DIAGNOSIS — I480 Paroxysmal atrial fibrillation: Secondary | ICD-10-CM

## 2021-09-28 LAB — CUP PACEART REMOTE DEVICE CHECK
Battery Remaining Longevity: 15 mo
Battery Voltage: 2.89 V
Brady Statistic AP VP Percent: 0.07 %
Brady Statistic AP VS Percent: 0.01 %
Brady Statistic AS VP Percent: 98.34 %
Brady Statistic AS VS Percent: 1.58 %
Brady Statistic RA Percent Paced: 0.08 %
Brady Statistic RV Percent Paced: 2.08 %
Date Time Interrogation Session: 20230925073633
HighPow Impedance: 45 Ohm
HighPow Impedance: 58 Ohm
Implantable Lead Implant Date: 20120208
Implantable Lead Implant Date: 20160627
Implantable Lead Implant Date: 20160627
Implantable Lead Location: 753858
Implantable Lead Location: 753860
Implantable Lead Location: 753860
Implantable Lead Model: 185
Implantable Lead Model: 4598
Implantable Lead Model: 5076
Implantable Lead Serial Number: 345409
Implantable Pulse Generator Implant Date: 20160627
Lead Channel Impedance Value: 342 Ohm
Lead Channel Impedance Value: 361 Ohm
Lead Channel Impedance Value: 399 Ohm
Lead Channel Impedance Value: 399 Ohm
Lead Channel Impedance Value: 418 Ohm
Lead Channel Impedance Value: 418 Ohm
Lead Channel Impedance Value: 456 Ohm
Lead Channel Impedance Value: 475 Ohm
Lead Channel Impedance Value: 665 Ohm
Lead Channel Impedance Value: 703 Ohm
Lead Channel Impedance Value: 703 Ohm
Lead Channel Impedance Value: 703 Ohm
Lead Channel Impedance Value: 722 Ohm
Lead Channel Pacing Threshold Amplitude: 0.375 V
Lead Channel Pacing Threshold Amplitude: 0.5 V
Lead Channel Pacing Threshold Amplitude: 1.875 V
Lead Channel Pacing Threshold Pulse Width: 0.4 ms
Lead Channel Pacing Threshold Pulse Width: 0.4 ms
Lead Channel Pacing Threshold Pulse Width: 0.4 ms
Lead Channel Sensing Intrinsic Amplitude: 1.25 mV
Lead Channel Sensing Intrinsic Amplitude: 1.25 mV
Lead Channel Sensing Intrinsic Amplitude: 5.75 mV
Lead Channel Sensing Intrinsic Amplitude: 5.75 mV
Lead Channel Setting Pacing Amplitude: 1.25 V
Lead Channel Setting Pacing Amplitude: 1.5 V
Lead Channel Setting Pacing Amplitude: 2 V
Lead Channel Setting Pacing Pulse Width: 0.4 ms
Lead Channel Setting Pacing Pulse Width: 0.8 ms
Lead Channel Setting Sensing Sensitivity: 0.45 mV

## 2021-10-07 NOTE — Progress Notes (Signed)
Remote ICD transmission.   

## 2021-10-08 ENCOUNTER — Other Ambulatory Visit (HOSPITAL_COMMUNITY): Payer: Self-pay

## 2021-10-09 ENCOUNTER — Other Ambulatory Visit (HOSPITAL_COMMUNITY): Payer: Self-pay

## 2021-10-11 ENCOUNTER — Ambulatory Visit: Payer: BC Managed Care – PPO | Admitting: Nurse Practitioner

## 2021-10-20 ENCOUNTER — Other Ambulatory Visit (HOSPITAL_COMMUNITY): Payer: Self-pay

## 2021-10-20 ENCOUNTER — Other Ambulatory Visit: Payer: Self-pay | Admitting: Psychiatry

## 2021-10-20 DIAGNOSIS — F900 Attention-deficit hyperactivity disorder, predominantly inattentive type: Secondary | ICD-10-CM

## 2021-10-20 MED ORDER — AMPHETAMINE-DEXTROAMPHETAMINE 10 MG PO TABS
5.0000 mg | ORAL_TABLET | Freq: Every day | ORAL | 0 refills | Status: DC
  Filled 2021-10-20: qty 30, 30d supply, fill #0

## 2021-10-21 ENCOUNTER — Other Ambulatory Visit (HOSPITAL_COMMUNITY): Payer: Self-pay

## 2021-10-23 ENCOUNTER — Other Ambulatory Visit (HOSPITAL_COMMUNITY): Payer: Self-pay

## 2021-10-25 ENCOUNTER — Other Ambulatory Visit (HOSPITAL_COMMUNITY): Payer: Self-pay

## 2021-10-25 DIAGNOSIS — M79641 Pain in right hand: Secondary | ICD-10-CM | POA: Diagnosis not present

## 2021-10-25 DIAGNOSIS — M79642 Pain in left hand: Secondary | ICD-10-CM | POA: Diagnosis not present

## 2021-10-25 DIAGNOSIS — M180 Bilateral primary osteoarthritis of first carpometacarpal joints: Secondary | ICD-10-CM | POA: Diagnosis not present

## 2021-10-25 DIAGNOSIS — M25831 Other specified joint disorders, right wrist: Secondary | ICD-10-CM | POA: Diagnosis not present

## 2021-10-25 MED ORDER — CELECOXIB 200 MG PO CAPS
200.0000 mg | ORAL_CAPSULE | Freq: Every day | ORAL | 2 refills | Status: DC
  Filled 2021-10-25: qty 30, 30d supply, fill #0
  Filled 2021-12-21: qty 30, 30d supply, fill #1

## 2021-10-29 ENCOUNTER — Other Ambulatory Visit (HOSPITAL_COMMUNITY): Payer: Self-pay

## 2021-11-17 ENCOUNTER — Other Ambulatory Visit: Payer: Self-pay | Admitting: Psychiatry

## 2021-11-17 ENCOUNTER — Other Ambulatory Visit (HOSPITAL_COMMUNITY): Payer: Self-pay

## 2021-11-17 DIAGNOSIS — F900 Attention-deficit hyperactivity disorder, predominantly inattentive type: Secondary | ICD-10-CM

## 2021-11-18 ENCOUNTER — Other Ambulatory Visit: Payer: Self-pay | Admitting: Psychiatry

## 2021-11-18 DIAGNOSIS — F900 Attention-deficit hyperactivity disorder, predominantly inattentive type: Secondary | ICD-10-CM

## 2021-11-18 MED ORDER — AMPHETAMINE-DEXTROAMPHETAMINE 10 MG PO TABS
5.0000 mg | ORAL_TABLET | Freq: Every day | ORAL | 0 refills | Status: DC
  Filled 2021-11-18: qty 30, 30d supply, fill #0

## 2021-11-18 MED ORDER — LISDEXAMFETAMINE DIMESYLATE 70 MG PO CAPS
70.0000 mg | ORAL_CAPSULE | Freq: Every day | ORAL | 0 refills | Status: DC
  Filled 2021-11-18: qty 30, 30d supply, fill #0

## 2021-11-19 ENCOUNTER — Other Ambulatory Visit (HOSPITAL_COMMUNITY): Payer: Self-pay

## 2021-11-22 ENCOUNTER — Encounter: Payer: Self-pay | Admitting: Psychiatry

## 2021-11-22 ENCOUNTER — Other Ambulatory Visit (HOSPITAL_COMMUNITY): Payer: Self-pay

## 2021-11-22 ENCOUNTER — Ambulatory Visit (INDEPENDENT_AMBULATORY_CARE_PROVIDER_SITE_OTHER): Payer: BC Managed Care – PPO | Admitting: Psychiatry

## 2021-11-22 VITALS — BP 125/82 | HR 83

## 2021-11-22 DIAGNOSIS — F900 Attention-deficit hyperactivity disorder, predominantly inattentive type: Secondary | ICD-10-CM | POA: Diagnosis not present

## 2021-11-22 DIAGNOSIS — G4726 Circadian rhythm sleep disorder, shift work type: Secondary | ICD-10-CM | POA: Diagnosis not present

## 2021-11-22 MED ORDER — AMPHETAMINE-DEXTROAMPHETAMINE 10 MG PO TABS
5.0000 mg | ORAL_TABLET | Freq: Every day | ORAL | 0 refills | Status: DC
  Filled 2021-11-22 – 2022-03-18 (×2): qty 90, 90d supply, fill #0

## 2021-11-22 MED ORDER — LISDEXAMFETAMINE DIMESYLATE 70 MG PO CAPS
70.0000 mg | ORAL_CAPSULE | Freq: Every day | ORAL | 0 refills | Status: DC
  Filled 2021-11-22 – 2021-12-20 (×2): qty 90, 90d supply, fill #0

## 2021-11-22 NOTE — Progress Notes (Signed)
[name] [id number] 07-Apr-1970 51 y.o.  Subjective:   Patient ID:  [name] is a 51 y.o. (DOB 03-11-1970) male.  Chief Complaint:  Chief Complaint  Patient presents with   Follow-up   ADHD   Depression    HPI [name] presents to the office today for follow-up of ADD and history of shift work sleep disorder..  +visit 02/18/19:.  No meds were changed.Continue Vyvanse 70 mg AM and occ Adderall 10 mg each pm.  08/21/19 appt with the following noted: Stressful but overall good since here.  No card problems since here.   Still doing woodworking.  Redecorated his living room at home.    Still on Vyvanse 70 and prn Adderall.  Some split double shifts. No SE.  Patient is under the care of cardiology.  His diagnoses include nonischemic cardiomyopathy, systolic congestive heart failure which is chronic, history of ventricular tachycardia, history of SVT.  He has a implantable defibrillator and pacemaker.  Pacemaker helped.   Cardiology had no concerns regarding stimulant use.  Career change decision at the beginning of the year and then Covid hit.  Had to return to [employer].  Has built and made things during Covid and other projects.  Enjoys making things.  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  Been productive.  Daughter has stayed with him since Covid a lot.  51yo.   Satisfied with ADD treatment with Vyvanse and occ immediate use Adderall 10 mg on occasion.  Needs this esp on long days.  Had trouble with the holidays with schedule and what to do.  This has prompted him to consider another vocation.  Comfortable rut for 6 years.  Needs to change.  Med wise the schedule has been more erratic requiring short-acting stimulant. Plan: no med changes.  02/19/2020 appointment with the following noted: Tumultuous several months with good and bad.   Health overall OK except AFIB about a month ago.  Previous was a year ago.  Never needed  treatment.  Thinks maybe stress triggered it. Has GF.  Supportive. On the whole anxiety is manageable but a lot of health stress issues this last year.  Ortho injuries Also has gout onset and changed diet. Changing jobs..   Plan continue Vyvanse  11/16/2020 appointment with the following noted: GF is NP.  Got pt assistance recently and able to get Vyvanse .  Adderall is uneven and more intrusive. Cardiac ouput is better. Plan: Continue Vyvanse 70 mg every morning and Adderall 10 mg in the afternoon on long work days  05/17/2021 appointment with the following noted: Adderall only on long days or split shifts. Tolerating meds well. Occ bouts of insomnia.  Sometimes trouble falling asleep   Patient reports stable mood and denies depressed or irritable moods.  Patient denies any recent difficulty with anxiety except as usual.  Patient denies difficulty with sleep initiation or maintenance and is better. Denies appetite disturbance.  Patient reports that energy and motivation have been good.  Patient denies any difficulty with concentration.  Patient denies any suicidal ideation. Plan: Continue Vyvanse 70 mg AM and occ Adderall 10 mg each pm. He's has patient assitance for Vyvanse.  11/22/21 appt noted: Disc shortage of Vyvanse.  Just got generic of 70 mg daily.  Didn't seem as effective. GF has heard generic seemed less effective.   He doesn't feel it lasts as long as brand did.  Taking Adderall more in the afternoon with it.  Pt assistance ran out of it. Had been otherwise satisfied with it.   No sign problems with depression or anxiety. Sleep affected by work and never has been great with lifelong problems with it.  Often some trouble going to sleep.  Can be pattern problems. Health stable with heart etc.  Past Psychiatric Medication Trials: Vyvanse 70, Concerta, Adderall, Adderall XR, Daytrana,  Wellbutrin 300,  clonazepam Patient in this practice since 2004 treated for ADD  Review of  Systems:  Review of Systems  Cardiovascular:  Positive for palpitations.  Neurological:  Negative for dizziness, tremors and weakness.  Psychiatric/Behavioral:  Positive for sleep disturbance. Negative for agitation, behavioral problems, confusion, decreased concentration, dysphoric mood, hallucinations, self-injury and suicidal ideas. The patient is not nervous/anxious and is not hyperactive.     Medications: I have reviewed the patient's current medications.  Current Outpatient Medications  Medication Sig Dispense Refill   albuterol (PROVENTIL HFA;VENTOLIN HFA) 108 (90 BASE) MCG/ACT inhaler Inhale 2 puffs into the lungs 4 (four) times daily as needed for wheezing. 1 Inhaler 5   allopurinol (ZYLOPRIM) 100 MG tablet Take 100 mg by mouth as needed (for ADD).     aspirin 81 MG tablet Take 81 mg by mouth daily.     carvedilol (COREG) 12.5 MG tablet Take 1 tablet by mouth 2 (two) times daily. Please make yearly appt. 180 tablet 3   celecoxib (CELEBREX) 200 MG capsule Take 1 capsule (200 mg total) by mouth daily with food 30 capsule 0   celecoxib (CELEBREX) 200 MG capsule Take 1 capsule (200 mg total) by mouth daily with food. 30 capsule 2   Coenzyme Q10 (CO Q 10 PO) Take 1 tablet by mouth daily.     fish oil-omega-3 fatty acids 1000 MG capsule Take 1 g by mouth daily.      Glucosamine-Chondroit-Vit C-Mn (GLUCOSAMINE 1500 COMPLEX PO) Take 1 tablet by mouth daily.     MAGNESIUM OXIDE PO Take by mouth daily. Helps with sleep     Multiple Vitamin (MULTIVITAMIN WITH MINERALS) TABS tablet Take 1 tablet by mouth daily.     sacubitril-valsartan (ENTRESTO) 24-26 MG Take 1 tablet by mouth 2 (two) times daily. 180 tablet 3   sildenafil (VIAGRA) 50 MG tablet Take 1 tablet by mouth daily as needed for erectile dysfunction. Please call for office visit [phone] 10 tablet 3   sotalol (BETAPACE) 80 MG tablet Take 1 tablet by mouth every 12 (twelve) hours. Please make yearly appt. 180 tablet 3    spironolactone (ALDACTONE) 25 MG tablet TAKE 1 TABLET BY MOUTH ONCE DAILY 30 tablet 6   Turmeric 500 MG CAPS Take 1,500 mg by mouth daily.      UNABLE TO FIND Take 2 tablets by mouth daily. Lions mane mushroom extract     amphetamine-dextroamphetamine (ADDERALL) 10 MG tablet Take 1/2 - 1 tablet (5 - 10 mg total) by mouth daily. 90 tablet 0   lisdexamfetamine (VYVANSE) 70 MG capsule Take 1 capsule by mouth daily. 90 capsule 0   No current facility-administered medications for this visit.    Medication Side Effects: None  Allergies: No Known Allergies  Past Medical History:  Diagnosis Date   ADHD (attention deficit hyperactivity disorder)    AICD (automatic cardioverter/defibrillator) present 06/30/2014   BiV ICD Insertion CRT-D   Asthma    ETOH abuse    Heart murmur    Nonischemic cardiomyopathy ([facility])    PAF (paroxysmal atrial fibrillation) ([place])    Pneumonia ~ 01/2014  Syncope    episode occured Feb 2011, ICD placed   Systolic congestive heart failure ([facility])    With ejection fraction of 25%, normal coronaries on Cath 02/2010   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[facility]) 1998   "right"   Ventricular tachycardia ([place])     Family History  Problem Relation Age of Onset   Heart disease Mother        mother and sister with valve problems   Pancreatic cancer Maternal Grandfather    Cardiomyopathy Neg Hx     Social History   Socioeconomic History   Marital status: Soil scientist    Spouse name: Not on file   Number of children: Not on file   Years of education: Not on file   Highest education level: Not on file  Occupational History   Occupation: Part time Programme researcher, broadcasting/film/video  Tobacco Use   Smoking status: Never    Passive exposure: Yes   Smokeless tobacco: Never   Tobacco comments:    "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"  Vaping Use   Vaping Use: Never used  Substance and Sexual Activity   Alcohol use: Yes    Alcohol/week: 28.0 standard drinks of alcohol     Types: 7 Glasses of wine, 21 Cans of beer per week    Comment: 06/30/2014 "2-6 beer2 or glasses of wine/day; probably 2/3 of these beers"   Drug use: No   Sexual activity: Not Currently  Other Topics Concern   Not on file  Social History Narrative   Not on file   Social Determinants of Health   Financial Resource Strain: Not on file  Food Insecurity: Not on file  Transportation Needs: Not on file  Physical Activity: Not on file  Stress: Not on file  Social Connections: Not on file  Intimate Partner Violence: Not on file    Past Medical History, Surgical history, Social history, and Family history were reviewed and updated as appropriate.   Please see review of systems for further details on the patient's review from today.   Objective:   Physical Exam:  BP 125/82   Pulse 83   Physical Exam Constitutional:      General: He is not in acute distress.    Appearance: He is well-developed.  Musculoskeletal:        General: No deformity.  Neurological:     Mental Status: He is alert and oriented to person, place, and time.     Motor: No tremor.     Coordination: Coordination normal.     Gait: Gait normal.  Psychiatric:        Attention and Perception: Attention normal. He is attentive. He does not perceive auditory hallucinations.        Mood and Affect: Mood normal. Mood is not anxious or depressed. Affect is not labile, blunt, angry or inappropriate.        Speech: Speech normal.        Behavior: Behavior normal.        Thought Content: Thought content normal. Thought content is not delusional. Thought content does not include homicidal or suicidal ideation. Thought content does not include suicidal plan.        Cognition and Memory: Cognition normal.        Judgment: Judgment normal.     Comments: Insight is good.      Lab Review:     Component Value Date/Time   NA 140 08/20/2021 1019  K 4.2 08/20/2021 1019   CL 101 08/20/2021 1019   CO2 25 08/20/2021 1019    GLUCOSE 79 08/20/2021 1019   GLUCOSE 98 04/05/2021 1446   BUN 15 08/20/2021 1019   CREATININE 1.05 08/20/2021 1019   CREATININE 1.11 04/05/2021 1446   CALCIUM 9.3 08/20/2021 1019   PROT 6.7 04/05/2021 1446   PROT 6.5 07/25/2019 1243   ALBUMIN 4.4 07/25/2019 1243   AST 17 04/05/2021 1446   ALT 18 04/05/2021 1446   ALKPHOS 41 (L) 07/25/2019 1243   BILITOT 0.6 04/05/2021 1446   BILITOT 0.4 07/25/2019 1243   GFRNONAA 97 01/29/2020 1302   GFRAA 112 01/29/2020 1302       Component Value Date/Time   WBC 10.6 04/05/2021 1446   RBC 4.11 (L) 04/05/2021 1446   HGB 13.9 04/05/2021 1446   HGB 13.6 08/14/2018 1521   HCT 41.2 04/05/2021 1446   HCT 41.1 08/14/2018 1521   PLT 182 04/05/2021 1446   PLT 178 08/14/2018 1521   MCV 100.2 (H) 04/05/2021 1446   MCV 98 (H) 08/14/2018 1521   MCH 33.8 (H) 04/05/2021 1446   MCHC 33.7 04/05/2021 1446   RDW 12.6 04/05/2021 1446   RDW 12.3 08/14/2018 1521   LYMPHSABS 1,049 04/05/2021 1446   MONOABS 0.4 06/23/2014 1139   EOSABS 32 04/05/2021 1446   BASOSABS 11 04/05/2021 1446    No results found for: "POCLITH", "LITHIUM"   No results found for: "PHENYTOIN", "PHENOBARB", "VALPROATE", "CBMZ"   .res Assessment: Plan:    [name] was seen today for follow-up, adhd and depression.  Diagnoses and all orders for this visit:  Attention deficit hyperactivity disorder (ADHD), predominantly inattentive type -     lisdexamfetamine (VYVANSE) 70 MG capsule; Take 1 capsule by mouth daily. -     amphetamine-dextroamphetamine (ADDERALL) 10 MG tablet; Take 1/2 - 1 tablet (5 - 10 mg total) by mouth daily.  Shift work sleep disorder  Anxiety manageable without meds.  Satisfied with Vyvanse and prn Adderall.  Continue Vyvanse 70 mg AM and occ Adderall 10 mg each pm. He's has patient assitance for Vyvanse. Disc will go generic this summer.  No change indicated.  But disc the alternative types of stimulants and durations and SE. Discussed potential  benefits, risks, and side effects of stimulants with patient to include increased heart rate, palpitations, insomnia, increased anxiety, increased irritability, or decreased appetite.  Instructed patient to contact office if experiencing any significant tolerability issues.  He's paying attention to the cardiac risk.  Sleep is better and using techniques help.  Wrestled with insomnia all his life. Sleep hygiene and ideal temps.  Good sleep tends to cycle.  FU 6 mos  Lynder Parents, MD, DFAPA   Please see After Visit Summary for patient specific instructions.  Future Appointments  Date Time Provider [place]  12/28/2021  8:35 AM [facility] DEVICE REMOTES [facility]  03/29/2022  8:35 AM [facility] DEVICE REMOTES [facility]  04/11/2022 10:00 AM Darrol Jump, NP [facility] None  06/28/2022  8:35 AM [facility] DEVICE REMOTES [facility]  09/27/2022  8:35 AM [facility] DEVICE REMOTES [facility]  12/27/2022  8:35 AM [facility] DEVICE REMOTES [facility]  03/28/2023  8:35 AM [facility] DEVICE REMOTES [facility]    No orders of the defined types were placed in this encounter.     -------------------------------

## 2021-12-20 ENCOUNTER — Other Ambulatory Visit (HOSPITAL_COMMUNITY): Payer: Self-pay

## 2021-12-21 ENCOUNTER — Other Ambulatory Visit (HOSPITAL_COMMUNITY): Payer: Self-pay

## 2021-12-28 ENCOUNTER — Ambulatory Visit (INDEPENDENT_AMBULATORY_CARE_PROVIDER_SITE_OTHER): Payer: Self-pay

## 2021-12-28 DIAGNOSIS — I428 Other cardiomyopathies: Secondary | ICD-10-CM

## 2021-12-28 LAB — CUP PACEART REMOTE DEVICE CHECK
Battery Remaining Longevity: 12 mo
Battery Voltage: 2.88 V
Brady Statistic AP VP Percent: 0.09 %
Brady Statistic AP VS Percent: 0.02 %
Brady Statistic AS VP Percent: 98.35 %
Brady Statistic AS VS Percent: 1.55 %
Brady Statistic RA Percent Paced: 0.11 %
Brady Statistic RV Percent Paced: 2.33 %
Date Time Interrogation Session: 20231226052824
HighPow Impedance: 45 Ohm
HighPow Impedance: 58 Ohm
Implantable Lead Connection Status: 753985
Implantable Lead Connection Status: 753985
Implantable Lead Connection Status: 753985
Implantable Lead Implant Date: 20120208
Implantable Lead Implant Date: 20160627
Implantable Lead Implant Date: 20160627
Implantable Lead Location: 753858
Implantable Lead Location: 753860
Implantable Lead Location: 753860
Implantable Lead Model: 185
Implantable Lead Model: 4598
Implantable Lead Model: 5076
Implantable Lead Serial Number: 345409
Implantable Pulse Generator Implant Date: 20160627
Lead Channel Impedance Value: 361 Ohm
Lead Channel Impedance Value: 361 Ohm
Lead Channel Impedance Value: 361 Ohm
Lead Channel Impedance Value: 399 Ohm
Lead Channel Impedance Value: 399 Ohm
Lead Channel Impedance Value: 399 Ohm
Lead Channel Impedance Value: 418 Ohm
Lead Channel Impedance Value: 456 Ohm
Lead Channel Impedance Value: 608 Ohm
Lead Channel Impedance Value: 646 Ohm
Lead Channel Impedance Value: 665 Ohm
Lead Channel Impedance Value: 665 Ohm
Lead Channel Impedance Value: 703 Ohm
Lead Channel Pacing Threshold Amplitude: 0.375 V
Lead Channel Pacing Threshold Amplitude: 0.5 V
Lead Channel Pacing Threshold Amplitude: 1.875 V
Lead Channel Pacing Threshold Pulse Width: 0.4 ms
Lead Channel Pacing Threshold Pulse Width: 0.4 ms
Lead Channel Pacing Threshold Pulse Width: 0.4 ms
Lead Channel Sensing Intrinsic Amplitude: 1.5 mV
Lead Channel Sensing Intrinsic Amplitude: 1.5 mV
Lead Channel Sensing Intrinsic Amplitude: 8 mV
Lead Channel Sensing Intrinsic Amplitude: 8 mV
Lead Channel Setting Pacing Amplitude: 1.25 V
Lead Channel Setting Pacing Amplitude: 1.5 V
Lead Channel Setting Pacing Amplitude: 2 V
Lead Channel Setting Pacing Pulse Width: 0.4 ms
Lead Channel Setting Pacing Pulse Width: 0.8 ms
Lead Channel Setting Sensing Sensitivity: 0.45 mV
Zone Setting Status: 755011
Zone Setting Status: 755011

## 2022-01-20 NOTE — Progress Notes (Signed)
Remote ICD transmission.   

## 2022-01-31 ENCOUNTER — Encounter: Payer: Self-pay | Admitting: Internal Medicine

## 2022-01-31 ENCOUNTER — Ambulatory Visit (INDEPENDENT_AMBULATORY_CARE_PROVIDER_SITE_OTHER): Payer: BC Managed Care – PPO | Admitting: Internal Medicine

## 2022-01-31 VITALS — BP 104/78 | HR 75 | Temp 97.7°F | Resp 16 | Ht 77.0 in | Wt 217.0 lb

## 2022-01-31 DIAGNOSIS — Z1152 Encounter for screening for COVID-19: Secondary | ICD-10-CM

## 2022-01-31 DIAGNOSIS — R6889 Other general symptoms and signs: Secondary | ICD-10-CM | POA: Diagnosis not present

## 2022-01-31 DIAGNOSIS — J0410 Acute tracheitis without obstruction: Secondary | ICD-10-CM | POA: Diagnosis not present

## 2022-01-31 LAB — POC COVID19 BINAXNOW: SARS Coronavirus 2 Ag: NEGATIVE

## 2022-01-31 LAB — POCT INFLUENZA A/B
Influenza A, POC: NEGATIVE
Influenza B, POC: NEGATIVE

## 2022-01-31 NOTE — Progress Notes (Signed)
Future Appointments  Date Time Provider Department  04/11/2022 10:00 AM Darrol Jump, NP [facility]  05/23/2022 10:30 AM Cottle, Billey Co., MD [facility]   History of Present Illness:     Patient is a very nice 52 yo DWM with HTN, ASHD/Afib/PPM -AICD/ChSysHF and remote Testicular Ca metastatic to [phone])  - the latter in remission with hx/o apparent limiting pulm fibrosis who presents with a 2 week hx/o persistent minimally productive cough. Has used an albuterol MDI for rescue. Was treated in this 2 week interim  about a week ago with a Zpak. He does feel sx's have improved. Covid & Flu A & B - both negative.   Current Outpatient Medications on File Prior to Visit  Medication Sig   albuterol (PROVENTIL HFA;VENTOLIN HFA) 108 (90 BASE) MCG/ACT inhaler Inhale 2 puffs into the lungs 4 (four) times daily as needed for wheezing.   allopurinol (ZYLOPRIM) 100 MG tablet Take 100 mg by mouth as needed (for ADD).   amphetamine-dextroamphetamine (ADDERALL) 10 MG tablet Take 1/2 - 1 tablet (5 - 10 mg total) by mouth daily.   aspirin 81 MG tablet Take 81 mg by mouth daily.   carvedilol (COREG) 12.5 MG tablet Take 1 tablet by mouth 2 (two) times daily. Please make yearly appt.   celecoxib (CELEBREX) 200 MG capsule Take 1 capsule (200 mg total) by mouth daily with food.   Coenzyme Q10 (CO Q 10 PO) Take 1 tablet by mouth daily.   fish oil-omega-3 fatty acids 1000 MG capsule Take 1 g by mouth daily.    Glucosamine-Chondroit-Vit C-Mn (GLUCOSAMINE 1500 COMPLEX PO) Take 1 tablet by mouth daily.   lisdexamfetamine (VYVANSE) 70 MG capsule Take 1 capsule by mouth daily.   MAGNESIUM OXIDE PO Take by mouth daily. Helps with sleep   Multiple Vitamin (MULTIVITAMIN WITH MINERALS) TABS tablet Take 1 tablet by mouth daily.   sacubitril-valsartan (ENTRESTO) 24-26 MG Take 1 tablet by mouth 2 (two) times daily.   sildenafil (VIAGRA) 50 MG tablet Take 1 tablet by mouth daily as needed for erectile dysfunction. Please  call for office visit [phone]   sotalol (BETAPACE) 80 MG tablet Take 1 tablet by mouth every 12 (twelve) hours. Please make yearly appt.   spironolactone (ALDACTONE) 25 MG tablet TAKE 1 TABLET BY MOUTH ONCE DAILY   Turmeric 500 MG CAPS Take 1,500 mg by mouth daily.    UNABLE TO FIND Take 2 tablets by mouth daily. Lions mane mushroom extract   celecoxib (CELEBREX) 200 MG capsule Take 1 capsule (200 mg total) by mouth daily with food (Patient not taking: Reported on 01/31/2022)   [DISCONTINUED] gabapentin (NEURONTIN) 300 MG capsule Take 1 capsule (300 mg total) by mouth 3 (three) times daily.    No Known Allergies   Problem list He has History of testicular cancer; Asthma; HYPERTENSION, HEART CONTROLLED W/O ASSOC CHF; CARDIOMYOPATHY, SECONDARY; SYNCOPE; Congestive heart failure, unspecified; Paroxysmal ventricular tachycardia ([place]); S/P implantation of automatic cardioverter/defibrillator (AICD); Chronic systolic CHF (congestive heart failure) ([place]); Atrial fibrillation, unspecified; Ventricular tachycardia ([place]); ICD (implantable cardioverter-defibrillator) discharge; Atrial fibrillation ([place]); Attention deficit hyperactivity disorder (ADHD); Ulnar neuropathy of left upper extremity; Acute idiopathic gout involving toe of left foot; Cervical strain; Sternoclavicular joint pain, left; Strain of flexor pollicis longus tendon; and Arthritis of carpometacarpal (CMC) joint of left thumb on their problem list.   Observations/Objective:  BP 104/78   Pulse 75   Temp 97.7 F (36.5 C)   Resp 16   Ht 6'  5" (1.956 m)   Wt 217 lb (98.4 kg)   SpO2 97%   BMI 25.73 kg/m   Dry cough. No stridor.   HEENT - WNL. Neck - supple.  Chest - BS decreased few fine dry rales clearing with cough & No rhonchi or wheezes.  Cor - Nl HS. RRR w/o sig MGR. PP 1(+). No edema. MS- FROM w/o deformities.  Gait Nl. Neuro -  Nl w/o focal abnormalities.  Assessment and Plan:   Tracheitis, resolving   Follow Up  Instructions:        I discussed the assessment and treatment plan with the patient. The patient was provided an opportunity to ask questions and all were answered. The patient agreed with the plan and demonstrated an understanding of the instructions.       The patient was advised to call back or seek an in-person evaluation if the symptoms worsen or if the condition fails to improve as anticipated.    Kirtland Bouchard, MD

## 2022-02-04 ENCOUNTER — Encounter: Payer: Self-pay | Admitting: Internal Medicine

## 2022-02-04 ENCOUNTER — Ambulatory Visit (INDEPENDENT_AMBULATORY_CARE_PROVIDER_SITE_OTHER): Payer: BC Managed Care – PPO | Admitting: Internal Medicine

## 2022-02-04 VITALS — BP 122/78 | HR 73 | Temp 97.2°F | Ht 77.0 in

## 2022-02-04 DIAGNOSIS — R1031 Right lower quadrant pain: Secondary | ICD-10-CM | POA: Diagnosis not present

## 2022-02-04 DIAGNOSIS — R109 Unspecified abdominal pain: Secondary | ICD-10-CM | POA: Diagnosis not present

## 2022-02-13 ENCOUNTER — Encounter: Payer: Self-pay | Admitting: Internal Medicine

## 2022-02-13 NOTE — Progress Notes (Signed)
     Future Appointments  Date Time Provider Department  04/11/2022 10:00 AM Darrol Jump, NP [facility]  05/23/2022 10:30 AM Cottle, Billey Co., MD [facility]    History of Present Illness:      Patient is a very nice 52 yo DWM with HTN, ASHD/Afib/PPM -AICD/ChSysHF and remote Testicular Ca metastatic to [phone])  who presents with a 2 day hx/o awakening  2-4 x/night with RLQ /Rt Inguinal cramping pains.  No associated N/V, Abdominal distention  or change in urine .     No Known Allergies   Problem list He has History of testicular cancer; Asthma; HYPERTENSION, HEART CONTROLLED W/O ASSOC CHF; CARDIOMYOPATHY, SECONDARY; SYNCOPE; Congestive heart failure, unspecified; Paroxysmal ventricular tachycardia ([place]); S/P implantation of automatic cardioverter/defibrillator (AICD); Chronic systolic CHF (congestive heart failure) ([place]); Ventricular tachycardia ([place]); ICD (implantable cardioverter-defibrillator) discharge; Atrial fibrillation ([place]); Attention deficit hyperactivity disorder (ADHD); Ulnar neuropathy of left upper extremity; Acute idiopathic gout involving toe of left foot; Cervical strain; Sternoclavicular joint pain, left; Strain of flexor pollicis longus tendon; and Arthritis of carpometacarpal (CMC) joint of left thumb on their problem list.   Observations/Objective:  BP 122/78   Pulse 73   Temp (!) 97.2 F (36.2 C)   Ht '6\' 5"'$  (1.956 m)   SpO2 98%   BMI 25.73 kg/m   HEENT - WNL. Neck - supple.  Chest - Clear equal BS. Cor - Nl HS. RRR w/o sig M. Abd - Soft w/o masses or point tenderness or guarding.  GU - No definite inguinal hernia , bulge or weakness in inguinal ring  - bilaterally.  MS- FROM w/o deformities.  Gait Nl. Neuro -  Nl w/o focal abnormalities.   Assessment and Plan:   1. Right lower quadrant abdominal pain   2. Rt inguinal pain  - Requested Surgical consultation to r/o Incipient Femoral Hernia.    Follow Up Instructions:        I discussed the  assessment and treatment plan with the patient. The patient was provided an opportunity to ask questions and all were answered. The patient agreed with the plan and demonstrated an understanding of the instructions.       The patient was advised to call back or seek an in-person evaluation if the symptoms worsen or if the condition fails to improve as anticipated.    Kirtland Bouchard, MD

## 2022-02-14 ENCOUNTER — Other Ambulatory Visit: Payer: BC Managed Care – PPO

## 2022-02-15 ENCOUNTER — Other Ambulatory Visit: Payer: BC Managed Care – PPO

## 2022-02-18 ENCOUNTER — Other Ambulatory Visit (HOSPITAL_COMMUNITY): Payer: Self-pay

## 2022-02-18 ENCOUNTER — Other Ambulatory Visit: Payer: Self-pay | Admitting: Internal Medicine

## 2022-02-18 ENCOUNTER — Other Ambulatory Visit: Payer: Self-pay

## 2022-02-18 MED ORDER — SOTALOL HCL 80 MG PO TABS
80.0000 mg | ORAL_TABLET | Freq: Two times a day (BID) | ORAL | 0 refills | Status: DC
  Filled 2022-02-18: qty 180, 90d supply, fill #0

## 2022-02-18 MED ORDER — CARVEDILOL 12.5 MG PO TABS
12.5000 mg | ORAL_TABLET | Freq: Two times a day (BID) | ORAL | 0 refills | Status: DC
  Filled 2022-02-18: qty 180, 90d supply, fill #0

## 2022-02-23 ENCOUNTER — Other Ambulatory Visit (HOSPITAL_COMMUNITY): Payer: Self-pay

## 2022-03-06 ENCOUNTER — Other Ambulatory Visit (HOSPITAL_COMMUNITY): Payer: Self-pay

## 2022-03-07 ENCOUNTER — Other Ambulatory Visit (HOSPITAL_COMMUNITY): Payer: Self-pay

## 2022-03-07 MED ORDER — CELECOXIB 200 MG PO CAPS
200.0000 mg | ORAL_CAPSULE | Freq: Every day | ORAL | 0 refills | Status: DC
  Filled 2022-03-07: qty 30, 30d supply, fill #0

## 2022-03-07 MED ORDER — AMOXICILLIN 500 MG PO CAPS
500.0000 mg | ORAL_CAPSULE | Freq: Three times a day (TID) | ORAL | 1 refills | Status: DC
  Filled 2022-03-07: qty 30, 10d supply, fill #0

## 2022-03-08 ENCOUNTER — Other Ambulatory Visit (HOSPITAL_COMMUNITY): Payer: Self-pay

## 2022-03-18 ENCOUNTER — Other Ambulatory Visit: Payer: Self-pay | Admitting: Psychiatry

## 2022-03-18 ENCOUNTER — Other Ambulatory Visit: Payer: Self-pay

## 2022-03-18 ENCOUNTER — Other Ambulatory Visit (HOSPITAL_COMMUNITY): Payer: Self-pay

## 2022-03-18 DIAGNOSIS — F900 Attention-deficit hyperactivity disorder, predominantly inattentive type: Secondary | ICD-10-CM

## 2022-03-18 MED ORDER — LISDEXAMFETAMINE DIMESYLATE 70 MG PO CAPS
70.0000 mg | ORAL_CAPSULE | Freq: Every day | ORAL | 0 refills | Status: DC
  Filled 2022-03-18: qty 90, 90d supply, fill #0

## 2022-03-22 ENCOUNTER — Other Ambulatory Visit (HOSPITAL_COMMUNITY): Payer: Self-pay

## 2022-03-29 ENCOUNTER — Ambulatory Visit (INDEPENDENT_AMBULATORY_CARE_PROVIDER_SITE_OTHER): Payer: BC Managed Care – PPO

## 2022-03-29 DIAGNOSIS — I428 Other cardiomyopathies: Secondary | ICD-10-CM

## 2022-03-30 LAB — CUP PACEART REMOTE DEVICE CHECK
Battery Remaining Longevity: 8 mo
Battery Voltage: 2.86 V
Brady Statistic AP VP Percent: 0.08 %
Brady Statistic AP VS Percent: 0.02 %
Brady Statistic AS VP Percent: 98.23 %
Brady Statistic AS VS Percent: 1.67 %
Brady Statistic RA Percent Paced: 0.1 %
Brady Statistic RV Percent Paced: 1.78 %
Date Time Interrogation Session: 20240327001708
HighPow Impedance: 43 Ohm
HighPow Impedance: 58 Ohm
Implantable Lead Connection Status: 753985
Implantable Lead Connection Status: 753985
Implantable Lead Connection Status: 753985
Implantable Lead Implant Date: 20120208
Implantable Lead Implant Date: 20160627
Implantable Lead Implant Date: 20160627
Implantable Lead Location: 753858
Implantable Lead Location: 753860
Implantable Lead Location: 753860
Implantable Lead Model: 185
Implantable Lead Model: 4598
Implantable Lead Model: 5076
Implantable Lead Serial Number: 345409
Implantable Pulse Generator Implant Date: 20160627
Lead Channel Impedance Value: 361 Ohm
Lead Channel Impedance Value: 361 Ohm
Lead Channel Impedance Value: 399 Ohm
Lead Channel Impedance Value: 399 Ohm
Lead Channel Impedance Value: 418 Ohm
Lead Channel Impedance Value: 418 Ohm
Lead Channel Impedance Value: 418 Ohm
Lead Channel Impedance Value: 475 Ohm
Lead Channel Impedance Value: 665 Ohm
Lead Channel Impedance Value: 703 Ohm
Lead Channel Impedance Value: 703 Ohm
Lead Channel Impedance Value: 703 Ohm
Lead Channel Impedance Value: 703 Ohm
Lead Channel Pacing Threshold Amplitude: 0.375 V
Lead Channel Pacing Threshold Amplitude: 0.5 V
Lead Channel Pacing Threshold Amplitude: 1.875 V
Lead Channel Pacing Threshold Pulse Width: 0.4 ms
Lead Channel Pacing Threshold Pulse Width: 0.4 ms
Lead Channel Pacing Threshold Pulse Width: 0.4 ms
Lead Channel Sensing Intrinsic Amplitude: 0.375 mV
Lead Channel Sensing Intrinsic Amplitude: 0.375 mV
Lead Channel Sensing Intrinsic Amplitude: 5.625 mV
Lead Channel Sensing Intrinsic Amplitude: 5.625 mV
Lead Channel Setting Pacing Amplitude: 1.25 V
Lead Channel Setting Pacing Amplitude: 1.5 V
Lead Channel Setting Pacing Amplitude: 2 V
Lead Channel Setting Pacing Pulse Width: 0.4 ms
Lead Channel Setting Pacing Pulse Width: 0.8 ms
Lead Channel Setting Sensing Sensitivity: 0.45 mV
Zone Setting Status: 755011
Zone Setting Status: 755011

## 2022-04-11 ENCOUNTER — Encounter: Payer: BC Managed Care – PPO | Admitting: Nurse Practitioner

## 2022-04-18 ENCOUNTER — Encounter: Payer: Self-pay | Admitting: *Deleted

## 2022-04-27 ENCOUNTER — Other Ambulatory Visit (HOSPITAL_COMMUNITY): Payer: Self-pay

## 2022-04-27 MED ORDER — CELECOXIB 200 MG PO CAPS
200.0000 mg | ORAL_CAPSULE | Freq: Every day | ORAL | 0 refills | Status: DC
  Filled 2022-04-27: qty 30, 30d supply, fill #0

## 2022-05-10 NOTE — Progress Notes (Signed)
Remote ICD transmission.   

## 2022-05-23 ENCOUNTER — Encounter: Payer: Self-pay | Admitting: Psychiatry

## 2022-05-23 ENCOUNTER — Other Ambulatory Visit (HOSPITAL_COMMUNITY): Payer: Self-pay

## 2022-05-23 ENCOUNTER — Ambulatory Visit (INDEPENDENT_AMBULATORY_CARE_PROVIDER_SITE_OTHER): Payer: BC Managed Care – PPO | Admitting: Psychiatry

## 2022-05-23 DIAGNOSIS — F325 Major depressive disorder, single episode, in full remission: Secondary | ICD-10-CM

## 2022-05-23 DIAGNOSIS — F900 Attention-deficit hyperactivity disorder, predominantly inattentive type: Secondary | ICD-10-CM | POA: Diagnosis not present

## 2022-05-23 MED ORDER — LISDEXAMFETAMINE DIMESYLATE 70 MG PO CAPS
70.0000 mg | ORAL_CAPSULE | Freq: Every day | ORAL | 0 refills | Status: DC
  Filled 2022-05-23 – 2022-06-16 (×3): qty 90, 90d supply, fill #0

## 2022-05-23 NOTE — Progress Notes (Signed)
[name] [id number] 1970/04/16 52 y.o.  Subjective:   Patient ID:  [name] is a 52 y.o. (DOB 1970-05-05) male.  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  +visit 02/18/19:.  No meds were changed.Continue Vyvanse 70 mg AM and occ Adderall 10 mg each pm.  08/21/19 appt with the following noted: Stressful but overall good since here.  No card problems since here.   Still doing woodworking.  Redecorated his living room at home.    Still on Vyvanse 70 and prn Adderall.  Some split double shifts. No SE.  Patient is under the care of cardiology.  His diagnoses include nonischemic cardiomyopathy, systolic congestive heart failure which is chronic, history of ventricular tachycardia, history of SVT.  He has a implantable defibrillator and pacemaker.  Pacemaker helped.   Cardiology had no concerns regarding stimulant use.  Career change decision at the beginning of the year and then Covid hit.  Had to return to [employer].  Has built and made things during Covid and other projects.  Enjoys making things.  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  Been productive.  [name] has stayed with him since Covid a lot.  52yo.   Satisfied with ADD treatment with Vyvanse and occ immediate use Adderall 10 mg on occasion.  Needs this esp on long days.  Had trouble with the holidays with schedule and what to do.  This has prompted him to consider another vocation.  Comfortable rut for 6 years.  Needs to change.  Med wise the schedule has been more erratic requiring short-acting stimulant. Plan: no med changes.  02/19/2020 appointment with the following noted: Tumultuous several months with good and bad.   Health overall OK except AFIB about a month ago.  Previous was a year ago.  Never needed treatment.   Thinks maybe stress triggered it. Has GF.  Supportive. On the whole anxiety is manageable but a lot of health stress issues this last year.  Ortho injuries Also has gout onset and changed diet. Changing jobs..   Plan continue Vyvanse  11/16/2020 appointment with the following noted: GF is NP.  Got pt assistance recently and able to get Vyvanse .  Adderall is uneven and more intrusive. Cardiac ouput is better. Plan: Continue Vyvanse 70 mg every morning and Adderall 10 mg in the afternoon on long work days  05/17/2021 appointment with the following noted: Adderall only on long days or split shifts. Tolerating meds well. Occ bouts of insomnia.  Sometimes trouble falling asleep   Patient reports stable mood and denies depressed or irritable moods.  Patient denies any recent difficulty with anxiety except as usual.  Patient denies difficulty with sleep initiation or maintenance and is better. Denies appetite disturbance.  Patient reports that energy and motivation have been good.  Patient denies any difficulty with concentration.  Patient denies any suicidal ideation. Plan: Continue Vyvanse 70 mg AM and occ Adderall 10 mg each pm. He's has patient assitance for Vyvanse.  11/22/21 appt noted: Disc shortage of Vyvanse.  Just got generic of 70 mg daily.  Didn't seem as effective. GF has heard generic seemed less effective.   He doesn't feel it lasts as long as brand did.  Taking Adderall more in the afternoon with it.   Pt  assistance ran out of it. Had been otherwise satisfied with it.   No sign problems with depression or anxiety. Sleep affected by work and never has been great with lifelong problems with it.  Often some trouble going to sleep.  Can be pattern problems. Health stable with heart etc. Plan: Continue Vyvanse 70 mg AM and occ Adderall 10 mg each pm.  05/23/22 appt noted: Generic Vyvanse does some benefit but not as good as the brand.  Longevity is not the same.   Using Adderalll if  works long shifts.  A couple of times per week. Overall still satisfied.  With stimulants. Overall still doing well with mood and anxiety. Insomnia comes and goes since childhood but used to it.  No changes desired. No SE Health stable.  Heart is better with type of pacemaker.    Past Psychiatric Medication Trials: Vyvanse 70, Concerta, Adderall, Adderall XR, Daytrana,  Wellbutrin 300,  clonazepam Patient in this practice since 2004 treated for ADD  Review of Systems:  Review of Systems  Cardiovascular:  Positive for palpitations.  Neurological:  Negative for dizziness and tremors.  Psychiatric/Behavioral:  Positive for sleep disturbance. Negative for agitation, behavioral problems, confusion, decreased concentration, dysphoric mood, hallucinations, self-injury and suicidal ideas. The patient is not nervous/anxious and is not hyperactive.     Medications: I have reviewed the patient's current medications.  Current Outpatient Medications  Medication Sig Dispense Refill   albuterol (PROVENTIL HFA;VENTOLIN HFA) 108 (90 BASE) MCG/ACT inhaler Inhale 2 puffs into the lungs 4 (four) times daily as needed for wheezing. 1 Inhaler 5   allopurinol (ZYLOPRIM) 100 MG tablet Take 100 mg by mouth as needed (for ADD).     amoxicillin (AMOXIL) 500 MG capsule Take 1 capsule (500 mg total) by mouth 3 (three) times daily for infection 30 capsule 1   amphetamine-dextroamphetamine (ADDERALL) 10 MG tablet Take 1/2 - 1 tablet (5 - 10 mg total) by mouth daily. 90 tablet 0   aspirin 81 MG tablet Take 81 mg by mouth daily.     carvedilol (COREG) 12.5 MG tablet Take 1 tablet by mouth 2 times daily. Please make yearly appt. 180 tablet 0   celecoxib (CELEBREX) 200 MG capsule Take 1 capsule (200 mg total) by mouth daily with food 30 capsule 0   celecoxib (CELEBREX) 200 MG capsule Take 1 capsule (200 mg total) by mouth daily with food. 30 capsule 0   Coenzyme Q10 (CO Q 10 PO) Take 1 tablet by mouth daily.     fish  oil-omega-3 fatty acids 1000 MG capsule Take 1 g by mouth daily.      Glucosamine-Chondroit-Vit C-Mn (GLUCOSAMINE 1500 COMPLEX PO) Take 1 tablet by mouth daily.     MAGNESIUM OXIDE PO Take by mouth daily. Helps with sleep     Multiple Vitamin (MULTIVITAMIN WITH MINERALS) TABS tablet Take 1 tablet by mouth daily.     sacubitril-valsartan (ENTRESTO) 24-26 MG Take 1 tablet by mouth 2 (two) times daily. 180 tablet 3   sildenafil (VIAGRA) 50 MG tablet Take 1 tablet by mouth daily as needed for erectile dysfunction. Please call for office visit [phone] 10 tablet 3   sotalol (BETAPACE) 80 MG tablet Take 1 tablet by mouth every 12 hours. Please make yearly appt. 180 tablet 0   spironolactone (ALDACTONE) 25 MG tablet TAKE 1 TABLET BY MOUTH ONCE DAILY 30 tablet 6   Turmeric 500 MG CAPS Take 1,500 mg by mouth daily.      UNABLE TO  FIND Take 2 tablets by mouth daily. Lions mane mushroom extract     lisdexamfetamine (VYVANSE) 70 MG capsule Take 1 capsule by mouth daily. 90 capsule 0   No current facility-administered medications for this visit.    Medication Side Effects: None  Allergies: No Known Allergies  Past Medical History:  Diagnosis Date   ADHD (attention deficit hyperactivity disorder)    AICD (automatic cardioverter/defibrillator) present 06/30/2014   BiV ICD Insertion CRT-D   Asthma    ETOH abuse    Heart murmur    Nonischemic cardiomyopathy (HCC)    PAF (paroxysmal atrial fibrillation) (HCC)    Pneumonia ~ 01/2014   Syncope    episode occured Feb 2011, ICD placed   Systolic congestive heart failure (HCC)    With ejection fraction of 25%, normal coronaries on Cath 02/2010   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[facility]) 1998   "right"   Ventricular tachycardia (HCC)     Family History  Problem Relation Age of Onset   Heart disease Mother        mother and sister with valve problems   Pancreatic cancer Maternal Grandfather    Cardiomyopathy Neg Hx     Social History   Socioeconomic History    Marital status: Media planner    Spouse name: Not on file   Number of children: Not on file   Years of education: Not on file   Highest education level: Not on file  Occupational History   Occupation: Part time Art gallery manager  Tobacco Use   Smoking status: Never    Passive exposure: Yes   Smokeless tobacco: Never   Tobacco comments:    "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"  Vaping Use   Vaping Use: Never used  Substance and Sexual Activity   Alcohol use: Yes    Alcohol/week: 28.0 standard drinks of alcohol    Types: 7 Glasses of wine, 21 Cans of beer per week    Comment: 06/30/2014 "2-6 beer2 or glasses of wine/day; probably 2/3 of these beers"   Drug use: No   Sexual activity: Not Currently  Other Topics Concern   Not on file  Social History Narrative   Not on file   Social Determinants of Health   Financial Resource Strain: Not on file  Food Insecurity: Not on file  Transportation Needs: Not on file  Physical Activity: Not on file  Stress: Not on file  Social Connections: Not on file  Intimate Partner Violence: Not on file    Past Medical History, Surgical history, Social history, and Family history were reviewed and updated as appropriate.   Please see review of systems for further details on the patient's review from today.   Objective:   Physical Exam:  There were no vitals taken for this visit.  Physical Exam Constitutional:      General: He is not in acute distress.    Appearance: He is well-developed.  Musculoskeletal:        General: No deformity.  Neurological:     Mental Status: He is alert and oriented to person, place, and time.     Motor: No tremor.     Coordination: Coordination normal.     Gait: Gait normal.  Psychiatric:        Attention and Perception: Attention normal. He is attentive. He does not perceive auditory hallucinations.        Mood and Affect: Mood normal. Mood is not anxious or  depressed.  Affect is not labile, blunt or inappropriate.        Speech: Speech normal.        Behavior: Behavior normal.        Thought Content: Thought content normal. Thought content is not delusional. Thought content does not include homicidal or suicidal ideation. Thought content does not include suicidal plan.        Cognition and Memory: Cognition normal.        Judgment: Judgment normal.     Comments: Insight is good.      Lab Review:     Component Value Date/Time   NA 140 08/20/2021 1019   K 4.2 08/20/2021 1019   CL 101 08/20/2021 1019   CO2 25 08/20/2021 1019   GLUCOSE 79 08/20/2021 1019   GLUCOSE 98 04/05/2021 1446   BUN 15 08/20/2021 1019   CREATININE 1.05 08/20/2021 1019   CREATININE 1.11 04/05/2021 1446   CALCIUM 9.3 08/20/2021 1019   PROT 6.7 04/05/2021 1446   PROT 6.5 07/25/2019 1243   ALBUMIN 4.4 07/25/2019 1243   AST 17 04/05/2021 1446   ALT 18 04/05/2021 1446   ALKPHOS 41 (L) 07/25/2019 1243   BILITOT 0.6 04/05/2021 1446   BILITOT 0.4 07/25/2019 1243   GFRNONAA 97 01/29/2020 1302   GFRAA 112 01/29/2020 1302       Component Value Date/Time   WBC 10.6 04/05/2021 1446   RBC 4.11 (L) 04/05/2021 1446   HGB 13.9 04/05/2021 1446   HGB 13.6 08/14/2018 1521   HCT 41.2 04/05/2021 1446   HCT 41.1 08/14/2018 1521   PLT 182 04/05/2021 1446   PLT 178 08/14/2018 1521   MCV 100.2 (H) 04/05/2021 1446   MCV 98 (H) 08/14/2018 1521   MCH 33.8 (H) 04/05/2021 1446   MCHC 33.7 04/05/2021 1446   RDW 12.6 04/05/2021 1446   RDW 12.3 08/14/2018 1521   LYMPHSABS 1,049 04/05/2021 1446   MONOABS 0.4 06/23/2014 1139   EOSABS 32 04/05/2021 1446   BASOSABS 11 04/05/2021 1446    No results found for: "POCLITH", "LITHIUM"   No results found for: "PHENYTOIN", "PHENOBARB", "VALPROATE", "CBMZ"   .res Assessment: Plan:    N[place] was seen today for follow-up and add.  Diagnoses and all orders for this visit:  Attention deficit hyperactivity disorder (ADHD), predominantly  inattentive type -     lisdexamfetamine (VYVANSE) 70 MG capsule; Take 1 capsule by mouth daily.  Major depressive disorder with single episode, in full remission (HCC)  Anxiety manageable without meds.  Satisfied with Vyvanse and prn Adderall.  Continue Vyvanse 70 mg AM and occ Adderall 10 mg each pm. No med changes  Disc generic vs brand effects   No change indicated.  But disc the alternative types of stimulants and durations and SE. Discussed potential benefits, risks, and side effects of stimulants with patient to include increased heart rate, palpitations, insomnia, increased anxiety, increased irritability, or decreased appetite.  Instructed patient to contact office if experiencing any significant tolerability issues.  He's paying attention to the cardiac risk.  Sleep is better and using techniques help.  Wrestled with insomnia all his life. Sleep hygiene and ideal temps.  Good sleep tends to cycle.  FU 6 mos  Meredith Staggers, MD, DFAPA   Please see After Visit Summary for patient specific instructions.  Future Appointments  Date Time Provider Department Center  06/28/2022  8:35 AM [facility] DEVICE REMOTES [facility]  09/27/2022  8:35 AM [facility] DEVICE REMOTES [facility]  12/27/2022  8:35 AM [facility] DEVICE REMOTES [facility]  03/28/2023  8:35 AM [facility] DEVICE REMOTES [facility]    No orders of the defined types were placed in this encounter.     -------------------------------

## 2022-06-16 ENCOUNTER — Other Ambulatory Visit: Payer: Self-pay | Admitting: Nurse Practitioner

## 2022-06-16 ENCOUNTER — Other Ambulatory Visit (HOSPITAL_COMMUNITY): Payer: Self-pay

## 2022-06-16 MED ORDER — CELECOXIB 200 MG PO CAPS
200.0000 mg | ORAL_CAPSULE | Freq: Every day | ORAL | 0 refills | Status: DC
  Filled 2022-06-16: qty 30, 30d supply, fill #0

## 2022-06-16 MED ORDER — SILDENAFIL CITRATE 50 MG PO TABS
50.0000 mg | ORAL_TABLET | Freq: Every day | ORAL | 3 refills | Status: DC | PRN
  Filled 2022-06-16: qty 10, 10d supply, fill #0
  Filled 2022-08-29 – 2022-09-09 (×2): qty 10, 10d supply, fill #1
  Filled 2022-11-07: qty 10, 30d supply, fill #2
  Filled 2022-12-14: qty 10, 30d supply, fill #3

## 2022-07-12 ENCOUNTER — Other Ambulatory Visit: Payer: Self-pay | Admitting: Nurse Practitioner

## 2022-07-12 ENCOUNTER — Other Ambulatory Visit (HOSPITAL_COMMUNITY): Payer: Self-pay

## 2022-07-12 DIAGNOSIS — J45909 Unspecified asthma, uncomplicated: Secondary | ICD-10-CM

## 2022-07-12 DIAGNOSIS — R062 Wheezing: Secondary | ICD-10-CM

## 2022-07-12 MED ORDER — ALBUTEROL SULFATE HFA 108 (90 BASE) MCG/ACT IN AERS
2.00 | INHALATION_SPRAY | Freq: Four times a day (QID) | RESPIRATORY_TRACT | 5 refills | Status: AC | PRN
Start: 2022-07-12 — End: ?
  Filled 2022-07-12: qty 6.7, 16d supply, fill #0

## 2022-08-29 ENCOUNTER — Other Ambulatory Visit: Payer: Self-pay | Admitting: Internal Medicine

## 2022-08-29 ENCOUNTER — Other Ambulatory Visit (HOSPITAL_COMMUNITY): Payer: Self-pay

## 2022-08-29 MED ORDER — ALLOPURINOL 100 MG PO TABS
100.0000 mg | ORAL_TABLET | ORAL | 2 refills | Status: DC | PRN
  Filled 2022-08-29: qty 90, fill #0

## 2022-08-30 ENCOUNTER — Other Ambulatory Visit (HOSPITAL_COMMUNITY): Payer: Self-pay

## 2022-08-31 ENCOUNTER — Other Ambulatory Visit (HOSPITAL_COMMUNITY): Payer: Self-pay

## 2022-08-31 MED ORDER — ENTRESTO 24-26 MG PO TABS
1.0000 | ORAL_TABLET | Freq: Two times a day (BID) | ORAL | 0 refills | Status: DC
  Filled 2022-08-31 – 2022-09-09 (×2): qty 60, 30d supply, fill #0

## 2022-08-31 MED ORDER — SOTALOL HCL 80 MG PO TABS
80.0000 mg | ORAL_TABLET | Freq: Two times a day (BID) | ORAL | 0 refills | Status: DC
  Filled 2022-08-31 – 2022-09-09 (×2): qty 60, 30d supply, fill #0

## 2022-08-31 MED ORDER — CARVEDILOL 12.5 MG PO TABS
12.5000 mg | ORAL_TABLET | Freq: Two times a day (BID) | ORAL | 0 refills | Status: DC
  Filled 2022-08-31 – 2022-09-09 (×2): qty 60, 30d supply, fill #0

## 2022-09-09 ENCOUNTER — Other Ambulatory Visit (HOSPITAL_COMMUNITY): Payer: Self-pay

## 2022-09-13 ENCOUNTER — Other Ambulatory Visit (HOSPITAL_COMMUNITY): Payer: Self-pay

## 2022-09-13 ENCOUNTER — Other Ambulatory Visit: Payer: Self-pay | Admitting: Psychiatry

## 2022-09-13 DIAGNOSIS — F900 Attention-deficit hyperactivity disorder, predominantly inattentive type: Secondary | ICD-10-CM

## 2022-09-13 MED ORDER — LISDEXAMFETAMINE DIMESYLATE 70 MG PO CAPS
70.0000 mg | ORAL_CAPSULE | Freq: Every day | ORAL | 0 refills | Status: DC
Start: 2022-09-13 — End: 2022-11-21
  Filled 2022-09-13: qty 90, 90d supply, fill #0

## 2022-09-16 ENCOUNTER — Other Ambulatory Visit (HOSPITAL_COMMUNITY): Payer: Self-pay

## 2022-09-23 ENCOUNTER — Other Ambulatory Visit: Payer: Self-pay

## 2022-09-23 ENCOUNTER — Other Ambulatory Visit (HOSPITAL_COMMUNITY): Payer: Self-pay

## 2022-09-23 MED ORDER — ALLOPURINOL 100 MG PO TABS
100.0000 mg | ORAL_TABLET | ORAL | 2 refills | Status: DC | PRN
  Filled 2022-09-23: qty 90, 90d supply, fill #0
  Filled 2022-11-07 – 2023-01-06 (×2): qty 90, 90d supply, fill #1
  Filled 2023-06-12: qty 90, 90d supply, fill #2

## 2022-09-24 ENCOUNTER — Other Ambulatory Visit (HOSPITAL_COMMUNITY): Payer: Self-pay

## 2022-10-21 ENCOUNTER — Other Ambulatory Visit (HOSPITAL_COMMUNITY): Payer: Self-pay

## 2022-11-07 ENCOUNTER — Other Ambulatory Visit: Payer: Self-pay

## 2022-11-07 ENCOUNTER — Other Ambulatory Visit (HOSPITAL_COMMUNITY): Payer: Self-pay

## 2022-11-07 ENCOUNTER — Other Ambulatory Visit: Payer: Self-pay | Admitting: Internal Medicine

## 2022-11-07 MED ORDER — CARVEDILOL 12.5 MG PO TABS
12.5000 mg | ORAL_TABLET | Freq: Two times a day (BID) | ORAL | 0 refills | Status: DC
  Filled 2022-11-07: qty 60, 30d supply, fill #0

## 2022-11-07 MED ORDER — ENTRESTO 24-26 MG PO TABS
1.0000 | ORAL_TABLET | Freq: Two times a day (BID) | ORAL | 0 refills | Status: DC
  Filled 2022-11-07: qty 60, 30d supply, fill #0

## 2022-11-07 MED ORDER — SOTALOL HCL 80 MG PO TABS
80.0000 mg | ORAL_TABLET | Freq: Two times a day (BID) | ORAL | 0 refills | Status: DC
  Filled 2022-11-07: qty 60, 30d supply, fill #0

## 2022-11-08 ENCOUNTER — Ambulatory Visit: Payer: BC Managed Care – PPO | Admitting: Internal Medicine

## 2022-11-08 DIAGNOSIS — I472 Ventricular tachycardia, unspecified: Secondary | ICD-10-CM

## 2022-11-08 DIAGNOSIS — I5022 Chronic systolic (congestive) heart failure: Secondary | ICD-10-CM

## 2022-11-08 DIAGNOSIS — I480 Paroxysmal atrial fibrillation: Secondary | ICD-10-CM

## 2022-11-08 DIAGNOSIS — Z4502 Encounter for adjustment and management of automatic implantable cardiac defibrillator: Secondary | ICD-10-CM

## 2022-11-11 ENCOUNTER — Other Ambulatory Visit (HOSPITAL_COMMUNITY): Payer: Self-pay

## 2022-11-21 ENCOUNTER — Ambulatory Visit: Payer: BC Managed Care – PPO | Admitting: Psychiatry

## 2022-11-21 ENCOUNTER — Other Ambulatory Visit (HOSPITAL_COMMUNITY): Payer: Self-pay

## 2022-11-21 ENCOUNTER — Encounter: Payer: Self-pay | Admitting: Psychiatry

## 2022-11-21 DIAGNOSIS — F325 Major depressive disorder, single episode, in full remission: Secondary | ICD-10-CM | POA: Diagnosis not present

## 2022-11-21 DIAGNOSIS — G4726 Circadian rhythm sleep disorder, shift work type: Secondary | ICD-10-CM | POA: Diagnosis not present

## 2022-11-21 DIAGNOSIS — F900 Attention-deficit hyperactivity disorder, predominantly inattentive type: Secondary | ICD-10-CM | POA: Diagnosis not present

## 2022-11-21 MED ORDER — LISDEXAMFETAMINE DIMESYLATE 70 MG PO CAPS
70.0000 mg | ORAL_CAPSULE | Freq: Every day | ORAL | 0 refills | Status: DC
  Filled 2022-11-21 – 2022-12-14 (×2): qty 90, 90d supply, fill #0

## 2022-11-21 NOTE — Progress Notes (Signed)
URA SLAIGHT [id number] [birth year] 52 y.o.  Subjective:   Patient ID:  [name] is a 52 y.o. (DOB 04-27-1970) male.  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  +visit 02/18/19:.  No meds were changed.Continue Vyvanse 70 mg AM and occ Adderall 10 mg each pm.  08/21/19 appt with the following noted: Stressful but overall good since here.  No card problems since here.   Still doing woodworking.  Redecorated his living room at home.    Still on Vyvanse 70 and prn Adderall.  Some split double shifts. No SE.  Patient is under the care of cardiology.  His diagnoses include nonischemic cardiomyopathy, systolic congestive heart failure which is chronic, history of ventricular tachycardia, history of SVT.  He has a implantable defibrillator and pacemaker.  Pacemaker helped.   Cardiology had no concerns regarding stimulant use.  Career change decision at the beginning of the year and then Covid hit.  Had to return to [employer].  Has built and made things during Covid and other projects.  Enjoys making things.  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  Been productive.  Daughter has stayed with him since Covid a lot.  52yo.   Satisfied with ADD treatment with Vyvanse and occ immediate use Adderall 10 mg on occasion.  Needs this esp on long days.  Had trouble with the holidays with schedule and what to do.  This has prompted him to consider another vocation.  Comfortable rut for 6 years.  Needs to change.  Med wise the schedule has been more erratic requiring short-acting stimulant. Plan: no med changes.  02/19/2020 appointment with the following noted: Tumultuous several months with good and bad.   Health overall OK except AFIB about a month ago.  Previous was a year ago.  Never needed treatment.  Thinks  maybe stress triggered it. Has GF.  Supportive. On the whole anxiety is manageable but a lot of health stress issues this last year.  Ortho injuries Also has gout onset and changed diet. Changing jobs..   Plan continue Vyvanse  11/16/2020 appointment with the following noted: GF is NP.  Got pt assistance recently and able to get Vyvanse .  Adderall is uneven and more intrusive. Cardiac ouput is better. Plan: Continue Vyvanse 70 mg every morning and Adderall 10 mg in the afternoon on long work days  05/17/2021 appointment with the following noted: Adderall only on long days or split shifts. Tolerating meds well. Occ bouts of insomnia.  Sometimes trouble falling asleep   Patient reports stable mood and denies depressed or irritable moods.  Patient denies any recent difficulty with anxiety except as usual.  Patient denies difficulty with sleep initiation or maintenance and is better. Denies appetite disturbance.  Patient reports that energy and motivation have been good.  Patient denies any difficulty with concentration.  Patient denies any suicidal ideation. Plan: Continue Vyvanse 70 mg AM and occ Adderall 10 mg each pm. He's has patient assitance for Vyvanse.  11/22/21 appt noted: Disc shortage of Vyvanse.  Just got generic of 70 mg daily.  Didn't seem as effective. GF has heard generic seemed less effective.   He doesn't feel it lasts as long as brand did.  Taking Adderall more in the afternoon with it.   Pt assistance ran out  of it. Had been otherwise satisfied with it.   No sign problems with depression or anxiety. Sleep affected by work and never has been great with lifelong problems with it.  Often some trouble going to sleep.  Can be pattern problems. Health stable with heart etc. Plan: Continue Vyvanse 70 mg AM and occ Adderall 10 mg each pm.  05/23/22 appt noted: Generic Vyvanse does some benefit but not as good as the brand.  Longevity is not the same.   Using Adderalll if works  long shifts.  A couple of times per week. Overall still satisfied.  With stimulants. Overall still doing well with mood and anxiety. Insomnia comes and goes since childhood but used to it.  No changes desired. No SE Health stable.  Heart is better with type of pacemaker.    11/21/22 appt noted:  Continues Vyvanse 70 mg AM and occ Adderall 10 mg each pm. No problems with getting Vyvanse.  Still good repsonse overall but generic not as long acting as brand.   Big career change.  Quit [employer].   GF.   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  Adjusting to day schedule with sleep. Always been a night owl.    Past Psychiatric Medication Trials: Vyvanse 70, Concerta, Adderall, Adderall XR, Daytrana,  Wellbutrin 300,  clonazepam Patient in this practice since 2004 treated for ADD  Review of Systems:  Review of Systems  Cardiovascular:  Positive for palpitations. Negative for chest pain.  Neurological:  Negative for dizziness and tremors.  Psychiatric/Behavioral:  Positive for sleep disturbance. Negative for agitation, behavioral problems, confusion, decreased concentration, dysphoric mood, hallucinations, self-injury and suicidal ideas. The patient is not nervous/anxious and is not hyperactive.     Medications: I have reviewed the patient's current medications.  Current Outpatient Medications  Medication Sig Dispense Refill   albuterol (VENTOLIN HFA) 108 (90 Base) MCG/ACT inhaler Inhale 2 puffs into the lungs 4 (four) times daily as needed for wheezing. 6.7 g 5   allopurinol (ZYLOPRIM) 100 MG tablet Take 1 tablet (100 mg total) by mouth as needed 90 tablet 2   amoxicillin (AMOXIL) 500 MG capsule Take 1 capsule (500 mg total) by mouth 3 (three) times daily for infection 30 capsule 1   amphetamine-dextroamphetamine (ADDERALL) 10 MG tablet Take 1/2 - 1 tablet (5 - 10 mg total) by mouth daily. 90 tablet 0   aspirin 81 MG  tablet Take 81 mg by mouth daily.     carvedilol (COREG) 12.5 MG tablet Take 1 tablet by mouth 2 times daily. Please make yearly appt. 60 tablet 0   celecoxib (CELEBREX) 200 MG capsule Take 1 capsule (200 mg total) by mouth daily with food 30 capsule 0   celecoxib (CELEBREX) 200 MG capsule Take 1 capsule (200 mg total) by mouth daily with food. 30 capsule 0   Coenzyme Q10 (CO Q 10 PO) Take 1 tablet by mouth daily.     fish oil-omega-3 fatty acids 1000 MG capsule Take 1 g by mouth daily.      Glucosamine-Chondroit-Vit C-Mn (GLUCOSAMINE 1500 COMPLEX PO) Take 1 tablet by mouth daily.     lisdexamfetamine (VYVANSE) 70 MG capsule Take 1 capsule (70 mg total) by mouth daily. 90 capsule 0   MAGNESIUM OXIDE PO Take by mouth daily. Helps with sleep     Multiple Vitamin (MULTIVITAMIN WITH MINERALS) TABS tablet Take 1 tablet by mouth  daily.     sacubitril-valsartan (ENTRESTO) 24-26 MG Take 1 tablet by mouth 2 (two) times daily. 60 tablet 0   sildenafil (VIAGRA) 50 MG tablet Take 1 tablet by mouth daily as needed for erectile dysfunction. 10 tablet 3   sotalol (BETAPACE) 80 MG tablet Take 1 tablet by mouth every 12 hours. Please make yearly appt. 60 tablet 0   spironolactone (ALDACTONE) 25 MG tablet TAKE 1 TABLET BY MOUTH ONCE DAILY 30 tablet 6   Turmeric 500 MG CAPS Take 1,500 mg by mouth daily.      UNABLE TO FIND Take 2 tablets by mouth daily. Lions mane mushroom extract     No current facility-administered medications for this visit.    Medication Side Effects: None  Allergies: No Known Allergies  Past Medical History:  Diagnosis Date   ADHD (attention deficit hyperactivity disorder)    AICD (automatic cardioverter/defibrillator) present 06/30/2014   BiV ICD Insertion CRT-D   Asthma    ETOH abuse    Heart murmur    Nonischemic cardiomyopathy (HCC)    PAF (paroxysmal atrial fibrillation) (HCC)    Pneumonia ~ 01/2014   Syncope    episode occured Feb 2011, ICD placed   Systolic congestive  heart failure (HCC)    With ejection fraction of 25%, normal coronaries on Cath 02/2010   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[facility]) 1998   "right"   Ventricular tachycardia (HCC)     Family History  Problem Relation Age of Onset   Heart disease Mother        mother and sister with valve problems   Pancreatic cancer Maternal Grandfather    Cardiomyopathy Neg Hx     Social History   Socioeconomic History   Marital status: Media planner    Spouse name: Not on file   Number of children: Not on file   Years of education: Not on file   Highest education level: Not on file  Occupational History   Occupation: Part time Art gallery manager  Tobacco Use   Smoking status: Never    Passive exposure: Yes   Smokeless tobacco: Never   Tobacco comments:    "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"  Vaping Use   Vaping status: Never Used  Substance and Sexual Activity   Alcohol use: Yes    Alcohol/week: 28.0 standard drinks of alcohol    Types: 7 Glasses of wine, 21 Cans of beer per week    Comment: 06/30/2014 "2-6 beer2 or glasses of wine/day; probably 2/3 of these beers"   Drug use: No   Sexual activity: Not Currently  Other Topics Concern   Not on file  Social History Narrative   Not on file   Social Determinants of Health   Financial Resource Strain: Not on file  Food Insecurity: Not on file  Transportation Needs: Not on file  Physical Activity: Not on file  Stress: Not on file  Social Connections: Not on file  Intimate Partner Violence: Not on file    Past Medical History, Surgical history, Social history, and Family history were reviewed and updated as appropriate.   Please see review of systems for further details on the patient's review from today.   Objective:   Physical Exam:  There were no vitals taken for this visit.  Physical Exam Constitutional:      General: He is not in acute distress.    Appearance: He is well-developed.  Musculoskeletal:  General: No deformity.  Neurological:     Mental Status: He is alert and oriented to person, place, and time.     Motor: No tremor.     Coordination: Coordination normal.     Gait: Gait normal.  Psychiatric:        Attention and Perception: Attention normal. He is attentive. He does not perceive auditory hallucinations.        Mood and Affect: Mood normal. Mood is not anxious or depressed. Affect is not labile or blunt.        Speech: Speech normal.        Behavior: Behavior normal.        Thought Content: Thought content normal. Thought content is not delusional. Thought content does not include homicidal or suicidal ideation. Thought content does not include suicidal plan.        Cognition and Memory: Cognition normal.        Judgment: Judgment normal.     Comments: Insight is good.      Lab Review:     Component Value Date/Time   NA 140 08/20/2021 1019   K 4.2 08/20/2021 1019   CL 101 08/20/2021 1019   CO2 25 08/20/2021 1019   GLUCOSE 79 08/20/2021 1019   GLUCOSE 98 04/05/2021 1446   BUN 15 08/20/2021 1019   CREATININE 1.05 08/20/2021 1019   CREATININE 1.11 04/05/2021 1446   CALCIUM 9.3 08/20/2021 1019   PROT 6.7 04/05/2021 1446   PROT 6.5 07/25/2019 1243   ALBUMIN 4.4 07/25/2019 1243   AST 17 04/05/2021 1446   ALT 18 04/05/2021 1446   ALKPHOS 41 (L) 07/25/2019 1243   BILITOT 0.6 04/05/2021 1446   BILITOT 0.4 07/25/2019 1243   GFRNONAA 97 01/29/2020 1302   GFRAA 112 01/29/2020 1302       Component Value Date/Time   WBC 10.6 04/05/2021 1446   RBC 4.11 (L) 04/05/2021 1446   HGB 13.9 04/05/2021 1446   HGB 13.6 08/14/2018 1521   HCT 41.2 04/05/2021 1446   HCT 41.1 08/14/2018 1521   PLT 182 04/05/2021 1446   PLT 178 08/14/2018 1521   MCV 100.2 (H) 04/05/2021 1446   MCV 98 (H) 08/14/2018 1521   MCH 33.8 (H) 04/05/2021 1446   MCHC 33.7 04/05/2021 1446   RDW 12.6 04/05/2021 1446   RDW 12.3 08/14/2018 1521   LYMPHSABS 1,049 04/05/2021 1446   MONOABS 0.4  06/23/2014 1139   EOSABS 32 04/05/2021 1446   BASOSABS 11 04/05/2021 1446    No results found for: "POCLITH", "LITHIUM"   No results found for: "PHENYTOIN", "PHENOBARB", "VALPROATE", "CBMZ"   .res Assessment: Plan:    [name] was seen today for follow-up.  Diagnoses and all orders for this visit:  Attention deficit hyperactivity disorder (ADHD), predominantly inattentive type  Major depressive disorder with single episode, in full remission (HCC)  Shift work sleep disorder   Anxiety manageable without meds.  Satisfied with Vyvanse and prn Adderall.  Continue Vyvanse 70 mg AM and occ Adderall 10 mg each pm. No med changes  Disc generic vs brand effects .  He prefers brand but cost is a problem.    No change indicated.  But disc the alternative types of stimulants and durations and SE. Discussed potential benefits, risks, and side effects of stimulants with patient to include increased heart rate, palpitations, insomnia, increased anxiety, increased irritability, or decreased appetite.  Instructed patient to contact office if experiencing any significant tolerability issues.  He's paying attention to  the cardiac risk.  Sleep is better and using techniques help but some adjustment to day schedule with new job.  Wrestled with insomnia all his life. Sleep hygiene and ideal temps.  Good sleep tends to cycle.  FU 6 mos  Meredith Staggers, MD, DFAPA   Please see After Visit Summary for patient specific instructions.  Future Appointments  Date Time Provider Department Center  12/20/2022 11:45 AM Duke Salvia, MD [facility]  12/27/2022  8:35 AM [facility] DEVICE REMOTES [facility]  03/28/2023  8:35 AM [facility] DEVICE REMOTES [facility]    No orders of the defined types were placed in this encounter.     -------------------------------

## 2022-12-14 ENCOUNTER — Other Ambulatory Visit (HOSPITAL_COMMUNITY): Payer: Self-pay

## 2022-12-15 ENCOUNTER — Other Ambulatory Visit (HOSPITAL_COMMUNITY): Payer: Self-pay

## 2022-12-20 ENCOUNTER — Encounter: Payer: Self-pay | Admitting: Internal Medicine

## 2022-12-20 ENCOUNTER — Ambulatory Visit: Payer: BC Managed Care – PPO | Attending: Internal Medicine | Admitting: Internal Medicine

## 2022-12-20 VITALS — BP 114/78 | HR 74 | Ht 77.0 in | Wt 220.4 lb

## 2022-12-20 DIAGNOSIS — I480 Paroxysmal atrial fibrillation: Secondary | ICD-10-CM

## 2022-12-20 DIAGNOSIS — I471 Supraventricular tachycardia, unspecified: Secondary | ICD-10-CM | POA: Diagnosis not present

## 2022-12-20 DIAGNOSIS — I5022 Chronic systolic (congestive) heart failure: Secondary | ICD-10-CM

## 2022-12-20 DIAGNOSIS — I472 Ventricular tachycardia, unspecified: Secondary | ICD-10-CM

## 2022-12-20 DIAGNOSIS — Z95810 Presence of automatic (implantable) cardiac defibrillator: Secondary | ICD-10-CM

## 2022-12-20 LAB — CUP PACEART INCLINIC DEVICE CHECK
Battery Remaining Longevity: 2 mo
Battery Voltage: 2.79 V
Brady Statistic AP VP Percent: 0.1 %
Brady Statistic AP VS Percent: 0.02 %
Brady Statistic AS VP Percent: 98.21 %
Brady Statistic AS VS Percent: 1.67 %
Brady Statistic RA Percent Paced: 0.12 %
Brady Statistic RV Percent Paced: 1.69 %
Date Time Interrogation Session: 20241217134858
HighPow Impedance: 52 Ohm
HighPow Impedance: 71 Ohm
Implantable Lead Connection Status: 753985
Implantable Lead Connection Status: 753985
Implantable Lead Connection Status: 753985
Implantable Lead Implant Date: 20120208
Implantable Lead Implant Date: 20160627
Implantable Lead Implant Date: 20160627
Implantable Lead Location: 753858
Implantable Lead Location: 753859
Implantable Lead Location: 753860
Implantable Lead Model: 185
Implantable Lead Model: 4598
Implantable Lead Model: 5076
Implantable Lead Serial Number: 345409
Implantable Pulse Generator Implant Date: 20160627
Lead Channel Impedance Value: 399 Ohm
Lead Channel Impedance Value: 399 Ohm
Lead Channel Impedance Value: 399 Ohm
Lead Channel Impedance Value: 418 Ohm
Lead Channel Impedance Value: 418 Ohm
Lead Channel Impedance Value: 418 Ohm
Lead Channel Impedance Value: 456 Ohm
Lead Channel Impedance Value: 513 Ohm
Lead Channel Impedance Value: 665 Ohm
Lead Channel Impedance Value: 665 Ohm
Lead Channel Impedance Value: 703 Ohm
Lead Channel Impedance Value: 703 Ohm
Lead Channel Impedance Value: 703 Ohm
Lead Channel Pacing Threshold Amplitude: 0.375 V
Lead Channel Pacing Threshold Amplitude: 0.5 V
Lead Channel Pacing Threshold Amplitude: 0.5 V
Lead Channel Pacing Threshold Amplitude: 1.875 V
Lead Channel Pacing Threshold Pulse Width: 0.4 ms
Lead Channel Pacing Threshold Pulse Width: 0.4 ms
Lead Channel Pacing Threshold Pulse Width: 0.4 ms
Lead Channel Pacing Threshold Pulse Width: 0.8 ms
Lead Channel Sensing Intrinsic Amplitude: 1.375 mV
Lead Channel Sensing Intrinsic Amplitude: 2.375 mV
Lead Channel Sensing Intrinsic Amplitude: 5 mV
Lead Channel Sensing Intrinsic Amplitude: 6.125 mV
Lead Channel Setting Pacing Amplitude: 1.25 V
Lead Channel Setting Pacing Amplitude: 1.5 V
Lead Channel Setting Pacing Amplitude: 2 V
Lead Channel Setting Pacing Pulse Width: 0.4 ms
Lead Channel Setting Pacing Pulse Width: 0.8 ms
Lead Channel Setting Sensing Sensitivity: 0.45 mV
Zone Setting Status: 755011
Zone Setting Status: 755011

## 2022-12-20 NOTE — Progress Notes (Signed)
Patient Care Team: Lucky Cowboy, MD as PCP - General (Internal Medicine) Duke Salvia, MD as PCP - Electrophysiology (Cardiology)   HPI  [name] is a 52 y.o. male Seen in followup for ICD implanted for secondary prevention.  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 Most recent ejection fraction was 25% or so.  CRT upgrade 6/15  He has had a history of symptomatic sustained ventricular tachycardia. Discussions were had concerning catheter ablation. It was elected to use sotalol but that was never initiated. He has had recurrent prolonged nonsustained ventricular tachycardia; his device has been programmed to minimize therapy for this.   He ended up with ICD shock 8/15  ATP failed to terminate VT, 24J shock failed and 35 joule shock was successful  There was associated syncope   Ended up on sotalol.  No further episodes  The patient denies chest pain, shortness of breath, nocturnal dyspnea, orthopnea or peripheral edema.  There have been no palpitations, lightheadedness or syncope.   Has left GV[facility]   DATE TEST EF   2/12 cMRI  27 %   5/16 Echo 30 %   9/18 Echo 45-50%     Date Cr K Mg Hgb  11/18 1.12 4.5    8/23  1.05 4.2 2.2 13.9             Past Medical History:  Diagnosis Date   ADHD (attention deficit hyperactivity disorder)    AICD (automatic cardioverter/defibrillator) present 06/30/2014   BiV ICD Insertion CRT-D   Asthma    ETOH abuse    Heart murmur    Nonischemic cardiomyopathy (HCC)    PAF (paroxysmal atrial fibrillation) (HCC)    Pneumonia ~ 01/2014   Syncope    episode occured Feb 2011, ICD placed   Systolic congestive heart failure (HCC)    With ejection fraction of 25%, normal coronaries on Cath 02/2010   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[facility]) 1998   "right"   Ventricular tachycardia [facility])     Past Surgical History:  Procedure Laterality Date   CARDIAC DEFIBRILLATOR PLACEMENT  2014   "single lead;  Medtronic"   EP IMPLANTABLE DEVICE N/A 06/30/2014   Procedure: BiV ICD Insertion CRT-D;  Surgeon: Duke Salvia, MD;  Location: [facility] INVASIVE CV LAB;  Service: Cardiovascular;  Laterality: N/A;   FRACTURE SURGERY Left ~ 1988   ORCHIECTOMY Right 1998   with abdominal lymph node dissection   WRIST FRACTURE SURGERY      Current Outpatient Medications  Medication Sig Dispense Refill   albuterol (VENTOLIN HFA) 108 (90 Base) MCG/ACT inhaler Inhale 2 puffs into the lungs 4 (four) times daily as needed for wheezing. 6.7 g 5   allopurinol (ZYLOPRIM) 100 MG tablet Take 1 tablet (100 mg total) by mouth as needed 90 tablet 2   amoxicillin (AMOXIL) 500 MG capsule Take 1 capsule (500 mg total) by mouth 3 (three) times daily for infection 30 capsule 1   amphetamine-dextroamphetamine (ADDERALL) 10 MG tablet Take 1/2 - 1 tablet (5 - 10 mg total) by mouth daily. 90 tablet 0   aspirin 81 MG tablet Take 81 mg by mouth daily.     carvedilol (COREG) 12.5 MG tablet Take 1 tablet by mouth 2 times daily. Please make yearly appt. 60 tablet 0   celecoxib (CELEBREX) 200 MG capsule Take 1 capsule (200 mg total) by mouth daily with food 30 capsule 0   celecoxib (CELEBREX) 200 MG capsule Take 1  capsule (200 mg total) by mouth daily with food. 30 capsule 0   Coenzyme Q10 (CO Q 10 PO) Take 1 tablet by mouth daily.     fish oil-omega-3 fatty acids 1000 MG capsule Take 1 g by mouth daily.      Glucosamine-Chondroit-Vit C-Mn (GLUCOSAMINE 1500 COMPLEX PO) Take 1 tablet by mouth daily.     lisdexamfetamine (VYVANSE) 70 MG capsule Take 1 capsule (70 mg total) by mouth daily. 90 capsule 0   MAGNESIUM OXIDE PO Take by mouth daily. Helps with sleep     Multiple Vitamin (MULTIVITAMIN WITH MINERALS) TABS tablet Take 1 tablet by mouth daily.     sacubitril-valsartan (ENTRESTO) 24-26 MG Take 1 tablet by mouth 2 (two) times daily. 60 tablet 0   sildenafil (VIAGRA) 50 MG tablet Take 1 tablet by mouth daily as needed for erectile  dysfunction. 10 tablet 3   sotalol (BETAPACE) 80 MG tablet Take 1 tablet by mouth every 12 hours. Please make yearly appt. 60 tablet 0   spironolactone (ALDACTONE) 25 MG tablet TAKE 1 TABLET BY MOUTH ONCE DAILY 30 tablet 6   Turmeric 500 MG CAPS Take 1,500 mg by mouth daily.      UNABLE TO FIND Take 2 tablets by mouth daily. Lions mane mushroom extract     No current facility-administered medications for this visit.   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"  Vaping Use   Vaping status: Never Used  Substance and Sexual Activity   Alcohol use: Yes    Alcohol/week: 28.0 standard drinks of alcohol    Types: 7 Glasses of wine, 21 Cans of beer per week    Comment: 06/30/2014 "2-6 beer2 or glasses of wine/day; probably 2/3 of these beers"   Drug use: No   Sexual activity: Not Currently  Other Topics Concern   Not on file  Social History Narrative   Not on file   Social Drivers of Health   Financial Resource Strain: Not on file  Food Insecurity: Not on file  Transportation Needs: Not on file  Physical Activity: Not on file  Stress: Not on file  Social Connections: Not on file  Intimate Partner Violence: Not on file     No Known Allergies  Review of Systems negative except from HPI and PMH  Physical Exam BP 114/78   Pulse 74   Ht 6\' 5"  (1.956 m)   Wt 220 lb 6.4 oz (100 kg)   SpO2 99%   BMI 26.14 kg/m  Well developed and well nourished in no acute distress HENT normal Neck supple with JVP-flat Clear Device pocket well healed; without hematoma or erythema.   There is no tethering  Regular rate and rhythm, no murmur Abd-soft with active BS No Clubbing cyanosis  edema Skin-warm and dry A & Oriented  Grossly normal sensory and motor function  ECG sinus @ 74 18/10/40 Low volts inferior MI  Anterolateral MI  Device function is normal. Programming changes   See Paceart  for details    Assessment and  Plan  Nonischemic cardiomyopathy  Congestive heart failure chronic  systolic  Ventricular tachycardia     Orthostatic lightheadedness    Implantable defibrillator-Medtronic CRT   High risk medication surveillance  SVT    Patient is socially stable.  Tolerating his medications.  No interval ventricular tachycardia.  Continue sotalol.  He needs to have a metabolic profile magnesium level checked.  Device is approaching ERI and have reviewed generator replacement

## 2022-12-20 NOTE — Patient Instructions (Addendum)
Medication Instructions:  Your physician recommends that you continue on your current medications as directed. Please refer to the Current Medication list given to you today.  *If you need a refill on your cardiac medications before your next appointment, please call your pharmacy*   Lab Work: None ordered.  If you have labs (blood work) drawn today and your tests are completely normal, you will receive your results only by: MyChart Message (if you have MyChart) OR A paper copy in the mail If you have any lab test that is abnormal or we need to change your treatment, we will call you to review the results.   Testing/Procedures: None ordered.    Follow-Up: At [facility], you and your health needs are our priority.  As part of our continuing mission to provide you with exceptional heart care, we have created designated Provider Care Teams.  These Care Teams include your primary Cardiologist (physician) and Advanced Practice Providers (APPs -  Physician Assistants and Nurse Practitioners) who all work together to provide you with the care you need, when you need it.  We recommend signing up for the patient portal called "MyChart".  Sign up information is provided on this After Visit Summary.  MyChart is used to connect with patients for Virtual Visits (Telemedicine).  Patients are able to view lab/test results, encounter notes, upcoming appointments, etc.  Non-urgent messages can be sent to your provider as well.   To learn more about what you can do with MyChart, go to [email or web address].    Your next appointment:    3 months with Dr Odessa Fleming PA, Otilio Saber - 03/27/2023 at 8am

## 2023-01-06 ENCOUNTER — Other Ambulatory Visit: Payer: Self-pay | Admitting: Internal Medicine

## 2023-01-06 ENCOUNTER — Other Ambulatory Visit: Payer: Self-pay

## 2023-01-06 ENCOUNTER — Other Ambulatory Visit (HOSPITAL_COMMUNITY): Payer: Self-pay

## 2023-01-06 MED ORDER — CARVEDILOL 12.5 MG PO TABS
12.5000 mg | ORAL_TABLET | Freq: Two times a day (BID) | ORAL | 3 refills | Status: AC
  Filled 2023-01-06: qty 180, 90d supply, fill #0
  Filled 2023-04-10: qty 180, 90d supply, fill #1
  Filled 2023-08-22: qty 180, 90d supply, fill #2
  Filled 2023-12-04: qty 180, 90d supply, fill #3

## 2023-01-06 MED ORDER — SOTALOL HCL 80 MG PO TABS
80.0000 mg | ORAL_TABLET | Freq: Two times a day (BID) | ORAL | 3 refills | Status: AC
  Filled 2023-01-06: qty 180, 90d supply, fill #0
  Filled 2023-04-10: qty 180, 90d supply, fill #1
  Filled 2023-08-22: qty 180, 90d supply, fill #2
  Filled 2023-12-04: qty 180, 90d supply, fill #3

## 2023-01-06 MED ORDER — ENTRESTO 24-26 MG PO TABS
1.0000 | ORAL_TABLET | Freq: Two times a day (BID) | ORAL | 3 refills | Status: AC
  Filled 2023-01-06: qty 180, 90d supply, fill #0
  Filled 2023-04-10: qty 180, 90d supply, fill #1
  Filled 2023-08-22: qty 180, 90d supply, fill #2
  Filled 2023-12-04: qty 180, 90d supply, fill #3

## 2023-01-07 ENCOUNTER — Other Ambulatory Visit (HOSPITAL_COMMUNITY): Payer: Self-pay

## 2023-03-09 ENCOUNTER — Other Ambulatory Visit (HOSPITAL_COMMUNITY): Payer: Self-pay

## 2023-03-09 MED ORDER — AMOXICILLIN 500 MG PO CAPS
500.0000 mg | ORAL_CAPSULE | Freq: Three times a day (TID) | ORAL | 1 refills | Status: DC
  Filled 2023-03-09: qty 30, 10d supply, fill #0
  Filled 2023-03-23: qty 30, 10d supply, fill #1

## 2023-03-14 ENCOUNTER — Other Ambulatory Visit (HOSPITAL_COMMUNITY): Payer: Self-pay

## 2023-03-14 ENCOUNTER — Other Ambulatory Visit: Payer: Self-pay | Admitting: Psychiatry

## 2023-03-14 DIAGNOSIS — F900 Attention-deficit hyperactivity disorder, predominantly inattentive type: Secondary | ICD-10-CM

## 2023-03-14 MED ORDER — LISDEXAMFETAMINE DIMESYLATE 70 MG PO CAPS
70.0000 mg | ORAL_CAPSULE | Freq: Every day | ORAL | 0 refills | Status: DC
  Filled 2023-03-14: qty 90, 90d supply, fill #0

## 2023-03-15 ENCOUNTER — Other Ambulatory Visit (HOSPITAL_COMMUNITY): Payer: Self-pay

## 2023-03-23 ENCOUNTER — Other Ambulatory Visit (HOSPITAL_COMMUNITY): Payer: Self-pay

## 2023-03-27 ENCOUNTER — Encounter: Payer: BC Managed Care – PPO | Admitting: Student

## 2023-03-27 ENCOUNTER — Ambulatory Visit: Payer: BC Managed Care – PPO | Admitting: Pulmonary Disease

## 2023-04-12 NOTE — Progress Notes (Signed)
 This encounter was created in error - please disregard.

## 2023-04-13 ENCOUNTER — Ambulatory Visit: Admitting: Pulmonary Disease

## 2023-04-18 ENCOUNTER — Other Ambulatory Visit (HOSPITAL_COMMUNITY): Payer: Self-pay

## 2023-04-28 NOTE — Telephone Encounter (Signed)
 Patient was given demonstration tone for ERI at [facility] on 04/13/23.   He is sure that is the sound he heard this morning.  Doing well otherwise, no concerns.   He will send us  a transmission when he gets home this evening.  I let him know we will reach back out on Monday to discuss next steps and plans to turn the alarm off here in clinic at that time if he would like.

## 2023-05-02 NOTE — Telephone Encounter (Signed)
 Spoke to pt about alarm.   Patient described alarm as a "buzzing tone" that buzzed several times in a row. Advised patient the device makes a solid tone if contact made by magnet or a [facility] ambulance with an alert on the device/battery/monitor not connected. Confirmed with patient the buzzing sound did not correlate with his device as the buzzing was not the tone sound. Pt was appreciative of call back.

## 2023-05-11 NOTE — Progress Notes (Signed)
  Electrophysiology Office Note:   ID:  LY STENCIL, DOB September 19, 1970, MRN [id number]  Primary Cardiologist: None Electrophysiologist: Richardo Chandler, MD      History of Present Illness:   [name] is a 53 y.o. 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   Since last being seen in our clinic the patient reports doing well. Overall, he denies chest pain, palpitations, dyspnea, PND, orthopnea, nausea, vomiting, dizziness, syncope, edema, weight gain, or early satiety.  Battery life keeps persistent beyond expectations.    Review of systems complete and found to be negative unless listed in HPI.   EP Information / Studies Reviewed:    EKG is ordered today. Personal review as below.  EKG Interpretation Date/Time:  Friday May 12 2023 10:00:12 EDT Ventricular Rate:  74 PR Interval:  174 QRS Duration:  110 QT Interval:  378 QTC Calculation: 419 R Axis:   -80  Text Interpretation: Normal sinus rhythm Left anterior fascicular block Anterolateral infarct (cited on or before 20-Dec-2022) When compared with ECG of 20-Dec-2022 12:56, Questionable change in initial forces of Lateral leads Confirmed by Pilar Bridge [phone]) on 05/12/2023 10:08:05 AM    ICD Interrogation-  reviewed in detail today,  See PACEART report.  Arrhythmia/Device History Medtronic BiV ICD implanted 06/30/2014 for NICM.  Previously had single chamber ICD implanted 02/10/2010. Has mixed lead system.   Reckart Pronounced as DUNN-NICK   Studies:  cMRI 02/2010 > LVEF 27% ECHO 09/2016 > LVEF 45-50%     Physical Exam:   VS:  BP 108/70   Pulse 74   Ht 6\' 5"  (1.956 m)   Wt 220 lb (99.8 kg)   SpO2 98%   BMI 26.09 kg/m    Wt Readings from Last 3 Encounters:  05/12/23 220 lb (99.8 kg)  12/20/22 220 lb 6.4 oz (100 kg)  01/31/22 217 lb (98.4 kg)     GEN: No acute distress  NECK: No JVD; No carotid bruits CARDIAC: Regular rate and  rhythm, no murmurs, rubs, gallops RESPIRATORY:  Clear to auscultation without rales, wheezing or rhonchi  ABDOMEN: Soft, non-tender, non-distended EXTREMITIES:  No edema; No deformity   ASSESSMENT AND PLAN:    Chronic systolic CHF  s/p Medtronic CRT-D  euvolemic today Stable on an appropriate medical regimen Normal ICD function See Pace Art report No changes today  Device battery at 2.74V with ERI at 2.73V. Reviewed procedure as well as risks and benefits today including, but not limited to, bleeding and infection. Device alarm demonstrated for pt.  OK to schedule for gen change once hits ERI without additional visit.  Would transition his care from Dr. Rodolfo Clan at that time, and discussed this today.  Paroxysmal AF EKG today shows NSR with stable intervals on sotalol  Continue sotalol  80 mg BID Not on OAC with CHA2DS2VASc of only 1   VT No recurrence  On sotalol  as above.  Labs today.   Disposition:   Follow up with MD as usual post procedure. Can schedule with next available EP MD (Other than GT or SK) once he hits ERI   Signed, Tylene Galla, PA-C

## 2023-05-12 ENCOUNTER — Encounter: Payer: Self-pay | Admitting: Student

## 2023-05-12 ENCOUNTER — Ambulatory Visit: Attending: Cardiology | Admitting: Student

## 2023-05-12 VITALS — BP 108/70 | HR 74 | Ht 77.0 in | Wt 220.0 lb

## 2023-05-12 DIAGNOSIS — I5022 Chronic systolic (congestive) heart failure: Secondary | ICD-10-CM | POA: Diagnosis not present

## 2023-05-12 DIAGNOSIS — I480 Paroxysmal atrial fibrillation: Secondary | ICD-10-CM | POA: Diagnosis not present

## 2023-05-12 DIAGNOSIS — I472 Ventricular tachycardia, unspecified: Secondary | ICD-10-CM

## 2023-05-12 DIAGNOSIS — Z95810 Presence of automatic (implantable) cardiac defibrillator: Secondary | ICD-10-CM

## 2023-05-12 DIAGNOSIS — I428 Other cardiomyopathies: Secondary | ICD-10-CM

## 2023-05-12 LAB — CUP PACEART INCLINIC DEVICE CHECK
Battery Remaining Longevity: 1 mo
Battery Voltage: 2.74 V
Brady Statistic AP VP Percent: 0.11 %
Brady Statistic AP VS Percent: 0.02 %
Brady Statistic AS VP Percent: 98.19 %
Brady Statistic AS VS Percent: 1.68 %
Brady Statistic RA Percent Paced: 0.13 %
Brady Statistic RV Percent Paced: 1.64 %
Date Time Interrogation Session: 20250509114402
HighPow Impedance: 50 Ohm
HighPow Impedance: 70 Ohm
Implantable Lead Connection Status: 753985
Implantable Lead Connection Status: 753985
Implantable Lead Connection Status: 753985
Implantable Lead Implant Date: 20120208
Implantable Lead Implant Date: 20160627
Implantable Lead Implant Date: 20160627
Implantable Lead Location: 753858
Implantable Lead Location: 753859
Implantable Lead Location: 753860
Implantable Lead Model: 185
Implantable Lead Model: 4598
Implantable Lead Model: 5076
Implantable Lead Serial Number: 345409
Implantable Pulse Generator Implant Date: 20160627
Lead Channel Impedance Value: 399 Ohm
Lead Channel Impedance Value: 399 Ohm
Lead Channel Impedance Value: 399 Ohm
Lead Channel Impedance Value: 418 Ohm
Lead Channel Impedance Value: 418 Ohm
Lead Channel Impedance Value: 418 Ohm
Lead Channel Impedance Value: 475 Ohm
Lead Channel Impedance Value: 513 Ohm
Lead Channel Impedance Value: 665 Ohm
Lead Channel Impedance Value: 703 Ohm
Lead Channel Impedance Value: 703 Ohm
Lead Channel Impedance Value: 703 Ohm
Lead Channel Impedance Value: 703 Ohm
Lead Channel Pacing Threshold Amplitude: 0.375 V
Lead Channel Pacing Threshold Amplitude: 0.5 V
Lead Channel Pacing Threshold Amplitude: 1.875 V
Lead Channel Pacing Threshold Pulse Width: 0.4 ms
Lead Channel Pacing Threshold Pulse Width: 0.4 ms
Lead Channel Pacing Threshold Pulse Width: 0.4 ms
Lead Channel Sensing Intrinsic Amplitude: 1.25 mV
Lead Channel Sensing Intrinsic Amplitude: 2.625 mV
Lead Channel Sensing Intrinsic Amplitude: 5.875 mV
Lead Channel Sensing Intrinsic Amplitude: 7 mV
Lead Channel Setting Pacing Amplitude: 1.25 V
Lead Channel Setting Pacing Amplitude: 1.5 V
Lead Channel Setting Pacing Amplitude: 2 V
Lead Channel Setting Pacing Pulse Width: 0.4 ms
Lead Channel Setting Pacing Pulse Width: 0.8 ms
Lead Channel Setting Sensing Sensitivity: 0.45 mV
Zone Setting Status: 755011
Zone Setting Status: 755011

## 2023-05-12 NOTE — Patient Instructions (Signed)
 Medication Instructions:  Your physician recommends that you continue on your current medications as directed. Please refer to the Current Medication list given to you today.  *If you need a refill on your cardiac medications before your next appointment, please call your pharmacy*  Lab Work: None ordered If you have labs (blood work) drawn today and your tests are completely normal, you will receive your results only by: MyChart Message (if you have MyChart) OR A paper copy in the mail If you have any lab test that is abnormal or we need to change your treatment, we will call you to review the results.  Follow-Up: At [facility], you and your health needs are our priority.  As part of our continuing mission to provide you with exceptional heart care, our providers are all part of one team.  This team includes your primary Cardiologist (physician) and Advanced Practice Providers or APPs (Physician Assistants and Nurse Practitioners) who all work together to provide you with the care you need, when you need it.  Your next appointment:   To be determined

## 2023-05-22 ENCOUNTER — Ambulatory Visit: Payer: BC Managed Care – PPO | Admitting: Psychiatry

## 2023-05-22 ENCOUNTER — Other Ambulatory Visit (HOSPITAL_COMMUNITY): Payer: Self-pay

## 2023-05-22 ENCOUNTER — Encounter: Payer: Self-pay | Admitting: Psychiatry

## 2023-05-22 DIAGNOSIS — F325 Major depressive disorder, single episode, in full remission: Secondary | ICD-10-CM | POA: Diagnosis not present

## 2023-05-22 DIAGNOSIS — F900 Attention-deficit hyperactivity disorder, predominantly inattentive type: Secondary | ICD-10-CM

## 2023-05-22 DIAGNOSIS — G4726 Circadian rhythm sleep disorder, shift work type: Secondary | ICD-10-CM

## 2023-05-22 MED ORDER — LISDEXAMFETAMINE DIMESYLATE 70 MG PO CAPS
70.0000 mg | ORAL_CAPSULE | Freq: Every day | ORAL | 0 refills | Status: DC
  Filled 2023-05-22 – 2023-06-12 (×2): qty 90, 90d supply, fill #0

## 2023-05-22 NOTE — Progress Notes (Signed)
 [name] [id number] 21-Jan-1970 53 y.o.  Subjective:   Patient ID:  [name] is a 53 y.o. (DOB [birth year]) male.  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  +visit 02/18/19:.  No meds were changed.Continue Vyvanse  70 mg AM and occ Adderall 10 mg each pm.  08/21/19 appt with the following noted: Stressful but overall good since here.  No card problems since here.   Still doing woodworking.  Redecorated his living room at home.    Still on Vyvanse  70 and prn Adderall.  Some split double shifts. No SE.  Patient is under the care of cardiology.  His diagnoses include nonischemic cardiomyopathy, systolic congestive heart failure which is chronic, history of ventricular tachycardia, history of SVT.  He has a implantable defibrillator and pacemaker.  Pacemaker helped.   Cardiology had no concerns regarding stimulant use.  Career change decision at the beginning of the year and then Covid hit.  Had to return to [employer].  Has built and made things during Covid and other projects.  Enjoys making things.  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  Been productive.  Daughter has stayed with him since Covid a lot.  53yo.   Satisfied with ADD treatment with Vyvanse  and occ immediate use Adderall 10 mg on occasion.  Needs this esp on long days.  Had trouble with the holidays with schedule and what to do.  This has prompted him to consider another vocation.  Comfortable rut for 6 years.  Needs to change.  Med wise the schedule has been more erratic requiring short-acting stimulant. Plan: no med changes.  02/19/2020 appointment with the following noted: Tumultuous several months with good and bad.   Health overall OK except AFIB about a month ago.  Previous was a year ago.  Never needed treatment.   Thinks maybe stress triggered it. Has GF.  Supportive. On the whole anxiety is manageable but a lot of health stress issues this last year.  Ortho injuries Also has gout onset and changed diet. Changing jobs..   Plan continue Vyvanse   11/16/2020 appointment with the following noted: GF is NP.  Got pt assistance recently and able to get Vyvanse  .  Adderall is uneven and more intrusive. Cardiac ouput is better. Plan: Continue Vyvanse  70 mg every morning and Adderall 10 mg in the afternoon on long work days  05/17/2021 appointment with the following noted: Adderall only on long days or split shifts. Tolerating meds well. Occ bouts of insomnia.  Sometimes trouble falling asleep   Patient reports stable mood and denies depressed or irritable moods.  Patient denies any recent difficulty with anxiety except as usual.  Patient denies difficulty with sleep initiation or maintenance and is better. Denies appetite disturbance.  Patient reports that energy and motivation have been good.  Patient denies any difficulty with concentration.  Patient denies any suicidal ideation. Plan: Continue Vyvanse  70 mg AM and occ Adderall 10 mg each pm. He's has patient assitance for Vyvanse .  11/22/21 appt noted: Disc shortage of Vyvanse .  Just got generic of 70 mg daily.  Didn't seem as effective. GF has heard generic seemed less effective.   He doesn't feel it lasts as long as brand did.  Taking Adderall more in the afternoon with it.   Pt  assistance ran out of it. Had been otherwise satisfied with it.   No sign problems with depression or anxiety. Sleep affected by work and never has been great with lifelong problems with it.  Often some trouble going to sleep.  Can be pattern problems. Health stable with heart etc. Plan: Continue Vyvanse  70 mg AM and occ Adderall 10 mg each pm.  05/23/22 appt noted: Generic Vyvanse  does some benefit but not as good as the brand.  Longevity is not the same.   Using Adderalll if  works long shifts.  A couple of times per week. Overall still satisfied.  With stimulants. Overall still doing well with mood and anxiety. Insomnia comes and goes since childhood but used to it.  No changes desired. No SE Health stable.  Heart is better with type of pacemaker.    11/21/22 appt noted:  Continues Vyvanse  70 mg AM and occ Adderall 10 mg each pm. No problems with getting Vyvanse .  Still good repsonse overall but generic not as long acting as brand.   Big career change.  Quit [employer].   GF.   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  Adjusting to day schedule with sleep. Always been a night owl.    05/22/23 appt noted: Continues Vyvanse  70 mg AM and occ Adderall 10 mg each pm. Doing well.  No concerns with meds. No SE.   Still good benefit. Salivary gland infection.  Disc this No new health px.   Overall sleep ok once asleep.  Brain will race some when lays down since a child.  Past Psychiatric Medication Trials: Vyvanse  70, Concerta, Adderall, Adderall XR, Daytrana,  Wellbutrin 300,  clonazepam Patient in this practice since 2004 treated for ADD  Review of Systems:  Review of Systems  HENT:  Positive for facial swelling.   Cardiovascular:  Negative for chest pain.  Neurological:  Negative for dizziness and tremors.  Psychiatric/Behavioral:  Positive for sleep disturbance. Negative for agitation, behavioral problems, confusion, decreased concentration, dysphoric mood, hallucinations, self-injury and suicidal ideas. The patient is not nervous/anxious and is not hyperactive.     Medications: I have reviewed the patient's current medications.  Current Outpatient Medications  Medication Sig Dispense Refill   albuterol  (VENTOLIN  HFA) 108 (90 Base) MCG/ACT inhaler Inhale 2 puffs into the lungs 4 (four) times daily as needed for wheezing. 6.7 g 5   allopurinol  (ZYLOPRIM ) 100 MG tablet Take 1 tablet (100  mg total) by mouth as needed 90 tablet 2   amphetamine -dextroamphetamine  (ADDERALL) 10 MG tablet Take 1/2 - 1 tablet (5 - 10 mg total) by mouth daily. 90 tablet 0   carvedilol  (COREG ) 12.5 MG tablet Take 1 tablet (12.5 mg total) by mouth 2 (two) times daily. 180 tablet 3   Coenzyme Q10 (CO Q 10 PO) Take 1 tablet by mouth daily.     fish oil-omega-3 fatty acids 1000 MG capsule Take 1 g by mouth daily.      Glucosamine-Chondroit-Vit C-Mn (GLUCOSAMINE 1500 COMPLEX PO) Take 1 tablet by mouth daily.     MAGNESIUM OXIDE PO Take by mouth daily. Helps with sleep     Multiple Vitamin (MULTIVITAMIN WITH MINERALS) TABS tablet Take 1 tablet by mouth daily.     sacubitril -valsartan  (ENTRESTO ) 24-26 MG Take 1 tablet by mouth 2 (two) times daily. 180 tablet 3   sildenafil  (VIAGRA ) 50 MG tablet Take 1 tablet by mouth  daily as needed for erectile dysfunction. 10 tablet 3   sotalol  (BETAPACE ) 80 MG tablet Take 1 tablet (80 mg total) by mouth every 12 (twelve) hours. 180 tablet 3   spironolactone  (ALDACTONE ) 25 MG tablet TAKE 1 TABLET BY MOUTH ONCE DAILY 30 tablet 6   Turmeric 500 MG CAPS Take 1,500 mg by mouth daily.      UNABLE TO FIND Take 2 tablets by mouth daily. Lions mane mushroom extract     lisdexamfetamine (VYVANSE ) 70 MG capsule Take 1 capsule (70 mg total) by mouth daily. 90 capsule 0   No current facility-administered medications for this visit.    Medication Side Effects: None  Allergies: No Known Allergies  Past Medical History:  Diagnosis Date   ADHD (attention deficit hyperactivity disorder)    AICD (automatic cardioverter/defibrillator) present 06/30/2014   BiV ICD Insertion CRT-D   Asthma    ETOH abuse    Heart murmur    Nonischemic cardiomyopathy (HCC)    PAF (paroxysmal atrial fibrillation) (HCC)    Pneumonia ~ 01/2014   Syncope    episode occured Feb 2011, ICD placed   Systolic congestive heart failure (HCC)    With ejection fraction of 25%, normal coronaries on Cath 02/2010    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[facility]) 1998   "right"   Ventricular tachycardia (HCC)     Family History  Problem Relation Age of Onset   Heart disease Mother        mother and sister with valve problems   Pancreatic cancer Maternal Grandfather    Cardiomyopathy Neg Hx     Social History   Socioeconomic History   Marital status: Media planner    Spouse name: Not on file   Number of children: Not on file   Years of education: Not on file   Highest education level: Not on file  Occupational History   Occupation: Part time Art gallery manager  Tobacco Use   Smoking status: Never    Passive exposure: Yes   Smokeless tobacco: Never   Tobacco comments:    "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"  Vaping Use   Vaping status: Never Used  Substance and Sexual Activity   Alcohol use: Yes    Alcohol/week: 28.0 standard drinks of alcohol    Types: 7 Glasses of wine, 21 Cans of beer per week    Comment: 06/30/2014 "2-6 beer2 or glasses of wine/day; probably 2/3 of these beers"   Drug use: No   Sexual activity: Not Currently  Other Topics Concern   Not on file  Social History Narrative   Not on file   Social Drivers of Health   Financial Resource Strain: Not on file  Food Insecurity: Not on file  Transportation Needs: Not on file  Physical Activity: Not on file  Stress: Not on file  Social Connections: Not on file  Intimate Partner Violence: Not on file    Past Medical History, Surgical history, Social history, and Family history were reviewed and updated as appropriate.   Please see review of systems for further details on the patient's review from today.   Objective:   Physical Exam:  There were no vitals taken for this visit.  Physical Exam Constitutional:      General: He is not in acute distress.    Appearance: He is well-developed.  Musculoskeletal:        General: No deformity.  Neurological:     Mental Status: He  is alert and oriented to  person, place, and time.     Motor: No tremor.     Coordination: Coordination normal.     Gait: Gait normal.  Psychiatric:        Attention and Perception: Attention normal. He is attentive. He does not perceive auditory hallucinations.        Mood and Affect: Mood normal. Mood is not anxious or depressed. Affect is not labile.        Speech: Speech normal.        Behavior: Behavior normal.        Thought Content: Thought content normal. Thought content is not delusional. Thought content does not include homicidal or suicidal ideation. Thought content does not include suicidal plan.        Cognition and Memory: Cognition normal.        Judgment: Judgment normal.     Comments: Insight is good.      Lab Review:     Component Value Date/Time   NA 140 08/20/2021 1019   K 4.2 08/20/2021 1019   CL 101 08/20/2021 1019   CO2 25 08/20/2021 1019   GLUCOSE 79 08/20/2021 1019   GLUCOSE 98 04/05/2021 1446   BUN 15 08/20/2021 1019   CREATININE 1.05 08/20/2021 1019   CREATININE 1.11 04/05/2021 1446   CALCIUM 9.3 08/20/2021 1019   PROT 6.7 04/05/2021 1446   PROT 6.5 07/25/2019 1243   ALBUMIN 4.4 07/25/2019 1243   AST 17 04/05/2021 1446   ALT 18 04/05/2021 1446   ALKPHOS 41 (L) 07/25/2019 1243   BILITOT 0.6 04/05/2021 1446   BILITOT 0.4 07/25/2019 1243   GFRNONAA 97 01/29/2020 1302   GFRAA 112 01/29/2020 1302       Component Value Date/Time   WBC 10.6 04/05/2021 1446   RBC 4.11 (L) 04/05/2021 1446   HGB 13.9 04/05/2021 1446   HGB 13.6 08/14/2018 1521   HCT 41.2 04/05/2021 1446   HCT 41.1 08/14/2018 1521   PLT 182 04/05/2021 1446   PLT 178 08/14/2018 1521   MCV 100.2 (H) 04/05/2021 1446   MCV 98 (H) 08/14/2018 1521   MCH 33.8 (H) 04/05/2021 1446   MCHC 33.7 04/05/2021 1446   RDW 12.6 04/05/2021 1446   RDW 12.3 08/14/2018 1521   LYMPHSABS 1,049 04/05/2021 1446   MONOABS 0.4 06/23/2014 1139   EOSABS 32 04/05/2021 1446   BASOSABS 11 04/05/2021 1446    No results found for:  "POCLITH", "LITHIUM"   No results found for: "PHENYTOIN", "PHENOBARB", "VALPROATE", "CBMZ"   .res Assessment: Plan:    [name] was seen today for follow-up and add.  Diagnoses and all orders for this visit:  Attention deficit hyperactivity disorder (ADHD), predominantly inattentive type -     lisdexamfetamine (VYVANSE ) 70 MG capsule; Take 1 capsule (70 mg total) by mouth daily.  Major depressive disorder with single episode, in full remission (HCC)  Shift work sleep disorder    Anxiety manageable without meds.  Satisfied with Vyvanse  and prn Adderall.  Continue Vyvanse  70 mg AM and occ Adderall 10 mg each pm. No med changes  Disc generic vs brand effects .  He prefers brand and cost no longer a px withit.  No change indicated.  But disc the alternative types of stimulants and durations and SE. Discussed potential benefits, risks, and side effects of stimulants with patient to include increased heart rate, palpitations, insomnia, increased anxiety, increased irritability, or decreased appetite.  Instructed patient to contact office if experiencing  any significant tolerability issues.  He's paying attention to the cardiac risk.  Sleep is ok.  LT tendency to initial  insomnia all his life. Sleep hygiene and ideal temps.  Good sleep tends to cycle.  FU 6 mos  Nori Beat, MD, DFAPA   Please see After Visit Summary for patient specific instructions.  No future appointments.   No orders of the defined types were placed in this encounter.     -------------------------------

## 2023-05-31 ENCOUNTER — Ambulatory Visit: Payer: Self-pay | Admitting: Cardiology

## 2023-06-05 DIAGNOSIS — I5022 Chronic systolic (congestive) heart failure: Secondary | ICD-10-CM

## 2023-06-12 ENCOUNTER — Other Ambulatory Visit: Payer: Self-pay

## 2023-06-12 ENCOUNTER — Other Ambulatory Visit (HOSPITAL_COMMUNITY): Payer: Self-pay

## 2023-06-12 ENCOUNTER — Other Ambulatory Visit: Payer: Self-pay | Admitting: Psychiatry

## 2023-06-12 DIAGNOSIS — I5022 Chronic systolic (congestive) heart failure: Secondary | ICD-10-CM

## 2023-06-12 DIAGNOSIS — F900 Attention-deficit hyperactivity disorder, predominantly inattentive type: Secondary | ICD-10-CM

## 2023-06-12 NOTE — Telephone Encounter (Signed)
 Spoke with patient to complete pre-procedure call.     New medical conditions?  No Recent hospitalizations or surgeries? No Started any new medications? No Patient made aware to contact office to inform of any new medications started. Any changes in activities of daily living? No  Pre-procedure testing scheduled: lab work on 6/23  Confirmed patient is scheduled for ICD generator change on Monday, July 7 with Dr. Clinton Danas. Instructed patient to arrive at the Main Entrance A at [facility]: [address] [place], [place] [address] and check in at Admitting at 10:30 AM  Advised of plan to go home the same day and will only stay overnight if medically necessary. You MUST have a responsible adult to drive you home and MUST be with you the first 24 hours after you arrive home or your procedure could be cancelled.  Patient verbalized understanding to information provided and is agreeable to proceed with procedure.

## 2023-06-12 NOTE — Telephone Encounter (Signed)
 Alert remote transmission:  RRT reached 06/03/23 - route to triage 2 NSVT classified events, one brief 1:1, the other 11 beats NSVT  [name] has done the pre-op discussion.  Patient is just needing to be set up for changeout at this point.  Forwarding to procedure scheduling to make aware. Thanks.

## 2023-06-15 ENCOUNTER — Telehealth: Payer: Self-pay

## 2023-06-15 NOTE — Telephone Encounter (Signed)
 Spoke with patient to complete pre-procedure call.     New medical conditions?  NO Recent hospitalizations or surgeries? NO Started any new medications? NO Patient made aware to contact office to inform of any new medications started. Any changes in activities of daily living? NO  Pre-procedure testing scheduled: Patient is aware to have lab work done on Monday, June 23rd.   Confirmed patient is scheduled for ICD generator change on Monday, July 7 with Dr. Clinton Danas. Instructed patient to arrive at the Main Entrance A at [facility]: [address] [place], [place] [address] and check in at Admitting at 10:30am.  Advised of plan to go home the same day and will only stay overnight if medically necessary. You MUST have a responsible adult to drive you home and MUST be with you the first 24 hours after you arrive home or your procedure could be cancelled.  Patient verbalized understanding to information provided and is agreeable to proceed with procedure.

## 2023-06-16 ENCOUNTER — Other Ambulatory Visit (HOSPITAL_COMMUNITY): Payer: Self-pay

## 2023-06-16 MED ORDER — AMPHETAMINE-DEXTROAMPHETAMINE 10 MG PO TABS
5.0000 mg | ORAL_TABLET | Freq: Every day | ORAL | 0 refills | Status: DC
  Filled 2023-06-16: qty 90, 90d supply, fill #0

## 2023-06-17 ENCOUNTER — Other Ambulatory Visit (HOSPITAL_COMMUNITY): Payer: Self-pay

## 2023-06-26 ENCOUNTER — Other Ambulatory Visit (HOSPITAL_COMMUNITY): Payer: Self-pay

## 2023-06-26 DIAGNOSIS — I5022 Chronic systolic (congestive) heart failure: Secondary | ICD-10-CM | POA: Diagnosis not present

## 2023-06-27 LAB — BASIC METABOLIC PANEL WITH GFR
BUN/Creatinine Ratio: 13 (ref 9–20)
BUN: 15 mg/dL (ref 6–24)
CO2: 21 mmol/L (ref 20–29)
Calcium: 9.6 mg/dL (ref 8.7–10.2)
Chloride: 102 mmol/L (ref 96–106)
Creatinine, Ser: 1.19 mg/dL (ref 0.76–1.27)
Glucose: 87 mg/dL (ref 70–99)
Potassium: 4.4 mmol/L (ref 3.5–5.2)
Sodium: 140 mmol/L (ref 134–144)
eGFR: 73 mL/min/{1.73_m2} (ref >59)

## 2023-06-27 LAB — CBC
Hematocrit: 41.7 % (ref 37.5–51.0)
Hemoglobin: 13.7 g/dL (ref 13.0–17.7)
MCH: 33.9 pg — ABNORMAL HIGH (ref 26.6–33.0)
MCHC: 32.9 g/dL (ref 31.5–35.7)
MCV: 103 fL — ABNORMAL HIGH (ref 79–97)
Platelets: 181 10*3/uL (ref 150–450)
RBC: 4.04 x10E6/uL — ABNORMAL LOW (ref 4.14–5.80)
RDW: 12.4 % (ref 11.6–15.4)
WBC: 7.5 10*3/uL (ref 3.4–10.8)

## 2023-06-29 ENCOUNTER — Telehealth (HOSPITAL_COMMUNITY): Payer: Self-pay

## 2023-06-29 NOTE — Telephone Encounter (Signed)
 Attempted to reach patient to discuss upcoming procedure, no answer. Left VM for patient to return call.

## 2023-07-02 ENCOUNTER — Ambulatory Visit: Payer: Self-pay | Admitting: Cardiology

## 2023-07-05 ENCOUNTER — Encounter: Payer: Self-pay | Admitting: Emergency Medicine

## 2023-07-09 NOTE — Pre-Procedure Instructions (Signed)
Instructed patient on the following items: Arrival time 1030  Nothing to eat or drink after midnight No meds AM of procedure Responsible person to drive you home and stay with you for 24 hrs Wash with special soap night before and morning of procedure 

## 2023-07-10 ENCOUNTER — Other Ambulatory Visit: Payer: Self-pay

## 2023-07-10 ENCOUNTER — Encounter (HOSPITAL_COMMUNITY): Payer: Self-pay | Admitting: Cardiology

## 2023-07-10 ENCOUNTER — Ambulatory Visit (HOSPITAL_COMMUNITY)
Admission: RE | Admit: 2023-07-10 | Discharge: 2023-07-10 | Disposition: A | Discharge status: Home / Self Care | Attending: Cardiology | Admitting: Cardiology

## 2023-07-10 ENCOUNTER — Encounter (HOSPITAL_COMMUNITY)
Admission: RE | Disposition: A | Discharge status: Home / Self Care | Payer: Self-pay | Source: Home / Self Care | Attending: Cardiology

## 2023-07-10 DIAGNOSIS — Z4502 Encounter for adjustment and management of automatic implantable cardiac defibrillator: Secondary | ICD-10-CM | POA: Diagnosis not present

## 2023-07-10 DIAGNOSIS — I428 Other cardiomyopathies: Secondary | ICD-10-CM | POA: Diagnosis not present

## 2023-07-10 DIAGNOSIS — I480 Paroxysmal atrial fibrillation: Secondary | ICD-10-CM | POA: Diagnosis not present

## 2023-07-10 DIAGNOSIS — I472 Ventricular tachycardia, unspecified: Secondary | ICD-10-CM | POA: Insufficient documentation

## 2023-07-10 DIAGNOSIS — Z9221 Personal history of antineoplastic chemotherapy: Secondary | ICD-10-CM | POA: Insufficient documentation

## 2023-07-10 DIAGNOSIS — I5022 Chronic systolic (congestive) heart failure: Secondary | ICD-10-CM | POA: Insufficient documentation

## 2023-07-10 HISTORY — PX: BIV ICD GENERATOR CHANGEOUT: EP1194

## 2023-07-10 SURGERY — BIV ICD GENERATOR CHANGEOUT
Anesthesia: LOCAL

## 2023-07-10 MED ORDER — ONDANSETRON HCL 4 MG/2ML IJ SOLN: 4.0000 mg | Freq: Four times a day (QID) | INTRAMUSCULAR | Status: DC | PRN

## 2023-07-10 MED ORDER — SODIUM CHLORIDE 0.9 % IV SOLN: INTRAVENOUS | Status: DC

## 2023-07-10 MED ORDER — CEFAZOLIN SODIUM-DEXTROSE 2-4 GM/100ML-% IV SOLN: 2.0000 g | INTRAVENOUS | Status: AC

## 2023-07-10 MED ORDER — LIDOCAINE HCL (PF) 1 % IJ SOLN
INTRAMUSCULAR | Status: AC
  Filled 2023-07-10: qty 30

## 2023-07-10 MED ORDER — FENTANYL CITRATE (PF) 100 MCG/2ML IJ SOLN
INTRAMUSCULAR | Status: DC | PRN
  Administered 2023-07-10 (×2): 50 ug via INTRAVENOUS

## 2023-07-10 MED ORDER — FENTANYL CITRATE (PF) 100 MCG/2ML IJ SOLN
INTRAMUSCULAR | Status: AC
  Filled 2023-07-10: qty 2

## 2023-07-10 MED ORDER — CEFAZOLIN SODIUM-DEXTROSE 2-4 GM/100ML-% IV SOLN
INTRAVENOUS | Status: AC
  Administered 2023-07-10: 2 g via INTRAVENOUS
  Filled 2023-07-10: qty 100

## 2023-07-10 MED ORDER — LIDOCAINE HCL (PF) 1 % IJ SOLN
INTRAMUSCULAR | Status: DC | PRN
  Administered 2023-07-10: 40 mL

## 2023-07-10 MED ORDER — SODIUM CHLORIDE 0.9 % IV SOLN
INTRAVENOUS | Status: AC
  Administered 2023-07-10: 80 mg
  Filled 2023-07-10: qty 2

## 2023-07-10 MED ORDER — POVIDONE-IODINE 10 % EX SWAB: 2.0000 | Freq: Once | CUTANEOUS | Status: DC

## 2023-07-10 MED ORDER — ACETAMINOPHEN 325 MG PO TABS: 325.0000 mg | ORAL_TABLET | ORAL | Status: DC | PRN

## 2023-07-10 MED ORDER — MIDAZOLAM HCL 2 MG/2ML IJ SOLN
INTRAMUSCULAR | Status: AC
  Filled 2023-07-10: qty 2

## 2023-07-10 MED ORDER — CHLORHEXIDINE GLUCONATE 4 % EX SOLN
4.0000 | Freq: Once | CUTANEOUS | Status: DC
  Filled 2023-07-10: qty 60

## 2023-07-10 MED ORDER — SODIUM CHLORIDE 0.9 % IV SOLN: 80.0000 mg | INTRAVENOUS | Status: AC

## 2023-07-10 MED ORDER — MIDAZOLAM HCL 5 MG/5ML IJ SOLN
INTRAMUSCULAR | Status: DC | PRN
Start: 2023-07-10 — End: 2023-07-10
  Administered 2023-07-10 (×2): 1 mg via INTRAVENOUS

## 2023-07-10 SURGICAL SUPPLY — 5 items
CABLE SURGICAL S-101-97-12 (CABLE) ×1 IMPLANT
ELECT DEFIB PAD ADLT CADENCE (PAD) IMPLANT
ICD COBALT XT QUAD CRT DTPA2Q1 (ICD Generator) IMPLANT
POUCH AIGIS-R ANTIBACT ICD LRG (Mesh General) IMPLANT
TRAY PACEMAKER INSERTION (PACKS) ×1 IMPLANT

## 2023-07-10 NOTE — H&P (Signed)
  Electrophysiology Note:   ID:  [name], DOB May 04, 1970, MRN [id number]   Primary Cardiologist: None Electrophysiologist: Elspeth Sage, MD       History of Present Illness:   [name] is a 53 y.o. 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    Since last being seen in our clinic the patient reports doing well. Overall, he denies chest pain, palpitations, dyspnea, PND, orthopnea, nausea, vomiting, dizziness, syncope, edema, weight gain, or early satiety.  Battery life keeps persistent beyond expectations.    Interval: Patient reached ERI. Presents today for generator change. Reports feeling relatively well. No new or acute complaints.    Review of systems complete and found to be negative unless listed in HPI.    EP Information / Studies Reviewed:      EKG Interpretation Date/Time:                  Friday May 12 2023 10:00:12 EDT Ventricular Rate:         74 PR Interval:                 174 QRS Duration:             110 QT Interval:                 378 QTC Calculation:419 R Axis:                         -80   Text Interpretation:Normal sinus rhythm Left anterior fascicular block Anterolateral infarct (cited on or before 20-Dec-2022) When compared with ECG of 20-Dec-2022 12:56, Questionable change in initial forces of Lateral leads Confirmed by Lesia Sharper [id number]) on 05/12/2023 10:08:05 AM     ICD Interrogation-  reviewed in detail today,  See PACEART report.   Arrhythmia/Device History Medtronic BiV ICD implanted 06/30/2014 for NICM.  Previously had single chamber ICD implanted 02/10/2010. Has mixed lead system.    Divis Pronounced as DUNN-NICK    Studies:  cMRI 02/2010 > LVEF 27% ECHO 09/2016 > LVEF 45-50%      Physical Exam:    Today's Vitals   07/10/23 1054  BP: 124/79  Pulse: 72  Resp: 18  Temp: 97.9 F (36.6 C)  SpO2: 94%  Weight: 99.8 kg  Height: 6' 5 (1.956 m)    Body mass index is 26.09 kg/m.  General: Well developed, in no acute distress.  Neck: No JVD.  Cardiac: Normal rate, regular rhythm.  Resp: Normal work of breathing.  Ext: No edema.  Neuro: No gross focal deficits.  Psych: Normal affect.     ASSESSMENT AND PLAN:     Chronic systolic CHF  s/p Medtronic CRT-D at ERI - Risks, benefits, and alternatives to ICD pulse generator replacement were discussed in detail today.  The patient understands that risks include but are not limited to bleeding, infection, pneumothorax, perforation, tamponade, vascular damage, renal failure, MI, stroke, death, inappropriate shocks, damage to his existing leads, and lead dislodgement and wishes to proceed today.    Fonda Kitty, MD, [facility], [facility] Cardiac Electrophysiology

## 2023-07-10 NOTE — Discharge Instructions (Addendum)

## 2023-07-14 ENCOUNTER — Telehealth: Payer: Self-pay

## 2023-07-14 MED FILL — Midazolam HCl Inj 2 MG/2ML (Base Equivalent): INTRAMUSCULAR | Qty: 2 | Status: AC

## 2023-07-14 NOTE — Telephone Encounter (Signed)
 Follow-up after same day discharge: Implant date: 07/10/2023 MD: Kennyth Device: Carelink ICD Location: Left Chest   Wound check visit: 07/25/2023 90 day MD follow-up: 10/24/2023  Remote Transmission received:no   Dressing/sling removed: n/a  Confirm OAC restart on: n/a  Please continue to monitor your cardiac device site for redness, swelling, and drainage. Call the device clinic at [phone] if you experience these symptoms, fever/chills, or have questions about your device.   Remote monitoring is used to monitor your cardiac device from home. This monitoring is scheduled every 91 days by our office. It allows us  to keep an eye on the functioning of your device to ensure it is working properly.  LMOVM for pt to give us  a call back.

## 2023-07-20 NOTE — Telephone Encounter (Signed)
 Pt states He is doing fine. Transmission received 07/15/2023. Dressing is removed and he is keeping the area dry. Pt not on an OAC.

## 2023-07-25 ENCOUNTER — Ambulatory Visit: Attending: Cardiology

## 2023-07-25 DIAGNOSIS — I428 Other cardiomyopathies: Secondary | ICD-10-CM | POA: Diagnosis not present

## 2023-07-25 LAB — CUP PACEART INCLINIC DEVICE CHECK
Date Time Interrogation Session: 20250722121714
Implantable Lead Connection Status: 753985
Implantable Lead Connection Status: 753985
Implantable Lead Connection Status: 753985
Implantable Lead Implant Date: 20120208
Implantable Lead Implant Date: 20160627
Implantable Lead Implant Date: 20160627
Implantable Lead Location: 753858
Implantable Lead Location: 753859
Implantable Lead Location: 753860
Implantable Lead Model: 185
Implantable Lead Model: 4598
Implantable Lead Model: 5076
Implantable Lead Serial Number: 345409
Implantable Pulse Generator Implant Date: 20250707

## 2023-07-25 NOTE — Progress Notes (Signed)
 Normal multi chamber ICD wound check. Wound well healed. Presenting rhythm: AS/VP 72 . Routine testing performed. Thresholds, sensing, and impedances consistent with implant measurements. Reviewed shock plan.  Pt enrolled in remote follow-up.

## 2023-07-25 NOTE — Patient Instructions (Signed)

## 2023-07-26 ENCOUNTER — Ambulatory Visit: Payer: Self-pay | Admitting: Cardiology

## 2023-08-14 ENCOUNTER — Ambulatory Visit (INDEPENDENT_AMBULATORY_CARE_PROVIDER_SITE_OTHER)

## 2023-08-14 DIAGNOSIS — I428 Other cardiomyopathies: Secondary | ICD-10-CM

## 2023-08-14 LAB — CUP PACEART REMOTE DEVICE CHECK
Battery Remaining Longevity: 122 mo
Battery Voltage: 3.08 V
Brady Statistic AP VP Percent: 0.61 %
Brady Statistic AP VS Percent: 0.09 %
Brady Statistic AS VP Percent: 91.01 %
Brady Statistic AS VS Percent: 8.29 %
Brady Statistic RA Percent Paced: 0.72 %
Brady Statistic RV Percent Paced: 1.54 %
Date Time Interrogation Session: 20250811002230
HighPow Impedance: 41 Ohm
HighPow Impedance: 57 Ohm
Implantable Lead Connection Status: 753985
Implantable Lead Connection Status: 753985
Implantable Lead Connection Status: 753985
Implantable Lead Implant Date: 20120208
Implantable Lead Implant Date: 20160627
Implantable Lead Implant Date: 20160627
Implantable Lead Location: 753858
Implantable Lead Location: 753859
Implantable Lead Location: 753860
Implantable Lead Model: 185
Implantable Lead Model: 4598
Implantable Lead Model: 5076
Implantable Lead Serial Number: 345409
Implantable Pulse Generator Implant Date: 20250707
Lead Channel Impedance Value: 361 Ohm
Lead Channel Impedance Value: 380 Ohm
Lead Channel Impedance Value: 380 Ohm
Lead Channel Impedance Value: 380 Ohm
Lead Channel Impedance Value: 380 Ohm
Lead Channel Impedance Value: 380 Ohm
Lead Channel Impedance Value: 399 Ohm
Lead Channel Impedance Value: 456 Ohm
Lead Channel Impedance Value: 684 Ohm
Lead Channel Impedance Value: 684 Ohm
Lead Channel Impedance Value: 684 Ohm
Lead Channel Impedance Value: 684 Ohm
Lead Channel Impedance Value: 703 Ohm
Lead Channel Pacing Threshold Amplitude: 0.375 V
Lead Channel Pacing Threshold Amplitude: 0.5 V
Lead Channel Pacing Threshold Amplitude: 0.625 V
Lead Channel Pacing Threshold Pulse Width: 0.4 ms
Lead Channel Pacing Threshold Pulse Width: 0.4 ms
Lead Channel Pacing Threshold Pulse Width: 0.8 ms
Lead Channel Sensing Intrinsic Amplitude: 1.4 mV
Lead Channel Sensing Intrinsic Amplitude: 5.3 mV
Lead Channel Setting Pacing Amplitude: 1.25 V
Lead Channel Setting Pacing Amplitude: 1.5 V
Lead Channel Setting Pacing Amplitude: 2 V
Lead Channel Setting Pacing Pulse Width: 0.4 ms
Lead Channel Setting Pacing Pulse Width: 0.8 ms
Lead Channel Setting Sensing Sensitivity: 0.3 mV
Zone Setting Status: 755011
Zone Setting Status: 755011
Zone Setting Status: 755011

## 2023-08-22 ENCOUNTER — Other Ambulatory Visit (HOSPITAL_COMMUNITY): Payer: Self-pay

## 2023-09-08 ENCOUNTER — Other Ambulatory Visit: Payer: Self-pay | Admitting: Psychiatry

## 2023-09-08 ENCOUNTER — Other Ambulatory Visit (HOSPITAL_COMMUNITY): Payer: Self-pay

## 2023-09-08 DIAGNOSIS — F900 Attention-deficit hyperactivity disorder, predominantly inattentive type: Secondary | ICD-10-CM

## 2023-09-08 MED ORDER — LISDEXAMFETAMINE DIMESYLATE 70 MG PO CAPS
70.0000 mg | ORAL_CAPSULE | Freq: Every day | ORAL | 0 refills | Status: DC
Start: 2023-09-08 — End: 2023-11-20
  Filled 2023-09-08: qty 90, 90d supply, fill #0

## 2023-09-10 ENCOUNTER — Ambulatory Visit: Payer: Self-pay | Admitting: Cardiology

## 2023-09-20 ENCOUNTER — Other Ambulatory Visit (HOSPITAL_COMMUNITY): Payer: Self-pay

## 2023-09-20 ENCOUNTER — Ambulatory Visit: Admitting: Family Medicine

## 2023-09-20 ENCOUNTER — Encounter: Payer: Self-pay | Admitting: Family Medicine

## 2023-09-20 VITALS — BP 123/75 | HR 85 | Ht 77.0 in | Wt 225.0 lb

## 2023-09-20 DIAGNOSIS — I509 Heart failure, unspecified: Secondary | ICD-10-CM

## 2023-09-20 DIAGNOSIS — Z23 Encounter for immunization: Secondary | ICD-10-CM

## 2023-09-20 DIAGNOSIS — R718 Other abnormality of red blood cells: Secondary | ICD-10-CM

## 2023-09-20 DIAGNOSIS — Z0000 Encounter for general adult medical examination without abnormal findings: Secondary | ICD-10-CM

## 2023-09-20 DIAGNOSIS — Z8547 Personal history of malignant neoplasm of testis: Secondary | ICD-10-CM | POA: Diagnosis not present

## 2023-09-20 DIAGNOSIS — N529 Male erectile dysfunction, unspecified: Secondary | ICD-10-CM | POA: Diagnosis not present

## 2023-09-20 DIAGNOSIS — F909 Attention-deficit hyperactivity disorder, unspecified type: Secondary | ICD-10-CM | POA: Diagnosis not present

## 2023-09-20 DIAGNOSIS — M1A9XX0 Chronic gout, unspecified, without tophus (tophi): Secondary | ICD-10-CM

## 2023-09-20 DIAGNOSIS — Z95810 Presence of automatic (implantable) cardiac defibrillator: Secondary | ICD-10-CM | POA: Diagnosis not present

## 2023-09-20 DIAGNOSIS — I429 Cardiomyopathy, unspecified: Secondary | ICD-10-CM | POA: Diagnosis not present

## 2023-09-20 DIAGNOSIS — J4520 Mild intermittent asthma, uncomplicated: Secondary | ICD-10-CM

## 2023-09-20 LAB — CBC WITH DIFFERENTIAL/PLATELET
Basophils Absolute: 0 K/uL (ref 0.0–0.1)
Basophils Relative: 0.7 % (ref 0.0–3.0)
Eosinophils Absolute: 0.2 K/uL (ref 0.0–0.7)
Eosinophils Relative: 2.9 % (ref 0.0–5.0)
HCT: 40.7 % (ref 39.0–52.0)
Hemoglobin: 13.6 g/dL (ref 13.0–17.0)
Lymphocytes Relative: 23.8 % (ref 12.0–46.0)
Lymphs Abs: 1.3 K/uL (ref 0.7–4.0)
MCHC: 33.4 g/dL (ref 30.0–36.0)
MCV: 99.5 fl (ref 78.0–100.0)
Monocytes Absolute: 0.5 K/uL (ref 0.1–1.0)
Monocytes Relative: 9 % (ref 3.0–12.0)
Neutro Abs: 3.4 K/uL (ref 1.4–7.7)
Neutrophils Relative %: 63.6 % (ref 43.0–77.0)
Platelets: 170 K/uL (ref 150.0–400.0)
RBC: 4.09 Mil/uL — ABNORMAL LOW (ref 4.22–5.81)
RDW: 13.3 % (ref 11.5–15.5)
WBC: 5.4 K/uL (ref 4.0–10.5)

## 2023-09-20 LAB — LIPID PANEL
Cholesterol: 202 mg/dL — ABNORMAL HIGH (ref 0–200)
HDL: 63.4 mg/dL (ref >39.00)
LDL Cholesterol: 117 mg/dL — ABNORMAL HIGH (ref 0–99)
NonHDL: 138.11
Total CHOL/HDL Ratio: 3
Triglycerides: 108 mg/dL (ref 0.0–149.0)
VLDL: 21.6 mg/dL (ref 0.0–40.0)

## 2023-09-20 LAB — TSH: TSH: 4.88 u[IU]/mL (ref 0.35–5.50)

## 2023-09-20 LAB — COMPREHENSIVE METABOLIC PANEL WITH GFR
ALT: 38 U/L (ref 0–53)
AST: 29 U/L (ref 0–37)
Albumin: 4.5 g/dL (ref 3.5–5.2)
Alkaline Phosphatase: 52 U/L (ref 39–117)
BUN: 18 mg/dL (ref 6–23)
CO2: 25 meq/L (ref 19–32)
Calcium: 9.3 mg/dL (ref 8.4–10.5)
Chloride: 105 meq/L (ref 96–112)
Creatinine, Ser: 1.31 mg/dL (ref 0.40–1.50)
GFR: 61.68 mL/min (ref >60.00)
Glucose, Bld: 97 mg/dL (ref 70–99)
Potassium: 4.4 meq/L (ref 3.5–5.1)
Sodium: 141 meq/L (ref 135–145)
Total Bilirubin: 0.6 mg/dL (ref 0.2–1.2)
Total Protein: 6.7 g/dL (ref 6.0–8.3)

## 2023-09-20 LAB — B12 AND FOLATE PANEL
Folate: >22.9 ng/mL (ref >5.9)
Vitamin B-12: 224 pg/mL (ref 211–911)

## 2023-09-20 MED ORDER — SILDENAFIL CITRATE 50 MG PO TABS
50.0000 mg | ORAL_TABLET | Freq: Every day | ORAL | 3 refills | Status: AC | PRN
  Filled 2023-09-20: qty 10, 10d supply, fill #0
  Filled 2023-12-04: qty 10, 10d supply, fill #1

## 2023-09-20 MED ORDER — ALLOPURINOL 100 MG PO TABS
100.0000 mg | ORAL_TABLET | Freq: Every day | ORAL | 1 refills | Status: AC
  Filled 2023-09-20: qty 90, 90d supply, fill #0
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
## 2023-09-20 NOTE — Assessment & Plan Note (Signed)
 No new symptoms. Following with cardiology.

## 2023-09-20 NOTE — Assessment & Plan Note (Signed)
 Well controled with daily allopurinol .  Continue lifestyle measures.

## 2023-09-20 NOTE — Assessment & Plan Note (Signed)
 Following with [facility]. Doing well on Vyvanse  with rare PRN Adderall use.

## 2023-09-20 NOTE — Assessment & Plan Note (Signed)
 Followed by cardiology

## 2023-09-20 NOTE — Assessment & Plan Note (Signed)
 Well managed with sildenafil . Refill provided.

## 2023-09-20 NOTE — Patient Instructions (Signed)

## 2023-09-20 NOTE — Assessment & Plan Note (Signed)
 Rarely needing albuterol . Continue PRN use.

## 2023-09-20 NOTE — Assessment & Plan Note (Signed)
 Treated in his 20's with chemo and surgeries. No recent issues.

## 2023-09-22 ENCOUNTER — Ambulatory Visit: Payer: Self-pay | Admitting: Family Medicine

## 2023-09-29 NOTE — Progress Notes (Signed)
Remote ICD Transmission.

## 2023-10-10 ENCOUNTER — Encounter

## 2023-10-22 NOTE — Progress Notes (Unsigned)
 Electrophysiology Office Note:   Date:  10/24/2023  ID:  [name], DOB 11/04/1970, MRN [id number]  Primary Cardiologist: None Primary Heart Failure: None Electrophysiologist: Fonda Kitty, MD       History of Present Illness:   [name] is a 53 y.o. male with h/o NICM due to chemotherapy s/p CRT-D, VT, and parox AF seen today for routine electrophysiology follow-up s/p generator change.  Since last being seen in our clinic the patient reports doing well.  He reports his wife is helping him with his medications and he has been more consistent with his meds.  He has had no issues with healing post generator change.  He  denies chest pain, palpitations, dyspnea, PND, orthopnea, nausea, vomiting, dizziness, syncope, edema, weight gain, or early satiety.    Review of systems complete and found to be negative unless listed in HPI.    EP Information / Studies Reviewed:    EKG is ordered today. Personal review as below.  EKG Interpretation Date/Time:  Tuesday October 24 2023 13:58:34 EDT Ventricular Rate:  69 PR Interval:  166 QRS Duration:  118 QT Interval:  398 QTC Calculation: 426 R Axis:   268  Text Interpretation: Normal sinus rhythm Confirmed by Aniceto Jarvis ([id number]) on 10/24/2023 2:11:17 PM   ICD Interrogation-  reviewed in detail today,  See PACEART report.  Device History: Medtronic BiV ICD implanted 06/30/14 for NICM Prior single chamber ICD implanted 02/10/2010.  Mixed lead system with Gore Lead.  Generator Change > 07/10/23 History of appropriate therapy: Yes, 08/2013 ATP failed to terminate, 24j shock failed, 35j shock was successful  History of AAD therapy: Sotalol  for VT    Arrhythmia / AAD / Pertinent EP Studies VT cMRI 02/2010 > LVEF 27% ECHO 09/2016 > LVEF 45-50%     Risk Assessment/Calculations:              Physical Exam:   VS:  BP (!) 96/58 (BP Location: Left Arm, Patient Position: Sitting, Cuff Size: Large)   Pulse 74   Ht 6' 5  (1.956 m)   Wt 226 lb (102.5 kg)   SpO2 95%   BMI 26.80 kg/m    Wt Readings from Last 3 Encounters:  10/24/23 226 lb (102.5 kg)  09/20/23 225 lb (102.1 kg)  07/10/23 220 lb (99.8 kg)     GEN: Well nourished, well developed in no acute distress NECK: No JVD; No carotid bruits CARDIAC: Regular rate and rhythm, no murmurs, rubs, gallops RESPIRATORY:  Clear to auscultation without rales, wheezing or rhonchi  ABDOMEN: Soft, non-tender, non-distended EXTREMITIES:  No edema; No deformity   ASSESSMENT AND PLAN:    Chronic Systolic Dysfunction due to NICM s/p Medtronic CRT-D  VT with Hx of ICD Shock  High Risk Medication Monitoring: Sotalol   -euvolemic by exam / device   -BiV pacing percentage at 90.9% > he has approximately 7% V sensed events which may represent PVCs this may be impacting his BiV pacing.  Unfortunately his BP limits titration of current regimen.  Will follow for next remote and BiV pacing percentage -dual coil Gore lead with stable impedance RV 46 ohms/SVC 64 ohms, stable -Stable on an appropriate medical regimen -Coreg  12.5 mg BID  -Sotalol  80 mg BID > CMP w/normal K+ -Normal ICD function -See Pace Art report -No changes today  Paroxysmal Atrial Fibrillation  -<0.1% burden on device  -not on OAC given low burden   Disposition:   Follow up with Dr. Kitty in 12  months or sooner pending BiV pacing/patient needs   Signed, Daphne Barrack, NP-C, AGACNP-BC [facility] - Electrophysiology  10/24/2023, 5:59 PM

## 2023-10-24 ENCOUNTER — Ambulatory Visit: Admitting: Cardiology

## 2023-10-24 ENCOUNTER — Encounter: Payer: Self-pay | Admitting: Pulmonary Disease

## 2023-10-24 ENCOUNTER — Ambulatory Visit: Attending: Pulmonary Disease | Admitting: Pulmonary Disease

## 2023-10-24 VITALS — BP 96/58 | HR 74 | Ht 77.0 in | Wt 226.0 lb

## 2023-10-24 DIAGNOSIS — Z5181 Encounter for therapeutic drug level monitoring: Secondary | ICD-10-CM | POA: Diagnosis not present

## 2023-10-24 DIAGNOSIS — I5022 Chronic systolic (congestive) heart failure: Secondary | ICD-10-CM | POA: Diagnosis not present

## 2023-10-24 DIAGNOSIS — I472 Ventricular tachycardia, unspecified: Secondary | ICD-10-CM | POA: Diagnosis not present

## 2023-10-24 DIAGNOSIS — I480 Paroxysmal atrial fibrillation: Secondary | ICD-10-CM | POA: Diagnosis not present

## 2023-10-24 DIAGNOSIS — I428 Other cardiomyopathies: Secondary | ICD-10-CM

## 2023-10-24 DIAGNOSIS — Z95810 Presence of automatic (implantable) cardiac defibrillator: Secondary | ICD-10-CM | POA: Diagnosis not present

## 2023-10-24 LAB — CUP PACEART INCLINIC DEVICE CHECK
Date Time Interrogation Session: 20251021180143
Implantable Lead Connection Status: 753985
Implantable Lead Connection Status: 753985
Implantable Lead Connection Status: 753985
Implantable Lead Implant Date: 20120208
Implantable Lead Implant Date: 20160627
Implantable Lead Implant Date: 20160627
Implantable Lead Location: 753858
Implantable Lead Location: 753859
Implantable Lead Location: 753860
Implantable Lead Model: 185
Implantable Lead Model: 4598
Implantable Lead Model: 5076
Implantable Lead Serial Number: 345409
Implantable Pulse Generator Implant Date: 20250707

## 2023-10-24 NOTE — Patient Instructions (Signed)
 Medication Instructions:  Your physician recommends that you continue on your current medications as directed. Please refer to the Current Medication list given to you today.  *If you need a refill on your cardiac medications before your next appointment, please call your pharmacy*  Lab Work: None ordered If you have labs (blood work) drawn today and your tests are completely normal, you will receive your results only by: MyChart Message (if you have MyChart) OR A paper copy in the mail If you have any lab test that is abnormal or we need to change your treatment, we will call you to review the results.  Follow-Up: At [facility], you and your health needs are our priority.  As part of our continuing mission to provide you with exceptional heart care, our providers are all part of one team.  This team includes your primary Cardiologist (physician) and Advanced Practice Providers or APPs (Physician Assistants and Nurse Practitioners) who all work together to provide you with the care you need, when you need it.  Your next appointment:   1 year(s)  Provider:   Fonda Kitty, MD or Daphne Barrack, NP

## 2023-10-28 ENCOUNTER — Ambulatory Visit: Payer: Self-pay | Admitting: Cardiology

## 2023-11-13 ENCOUNTER — Ambulatory Visit (INDEPENDENT_AMBULATORY_CARE_PROVIDER_SITE_OTHER)

## 2023-11-13 DIAGNOSIS — I428 Other cardiomyopathies: Secondary | ICD-10-CM

## 2023-11-14 LAB — CUP PACEART REMOTE DEVICE CHECK
Battery Remaining Longevity: 120 mo
Battery Voltage: 3.04 V
Brady Statistic RV Percent Paced: 1.59 %
Date Time Interrogation Session: 20251110000722
HighPow Impedance: 42 Ohm
HighPow Impedance: 57 Ohm
Implantable Lead Connection Status: 753985
Implantable Lead Connection Status: 753985
Implantable Lead Connection Status: 753985
Implantable Lead Implant Date: 20120208
Implantable Lead Implant Date: 20160627
Implantable Lead Implant Date: 20160627
Implantable Lead Location: 753858
Implantable Lead Location: 753859
Implantable Lead Location: 753860
Implantable Lead Model: 185
Implantable Lead Model: 4598
Implantable Lead Model: 5076
Implantable Lead Serial Number: 345409
Implantable Pulse Generator Implant Date: 20250707
Lead Channel Impedance Value: 342 Ohm
Lead Channel Impedance Value: 361 Ohm
Lead Channel Impedance Value: 361 Ohm
Lead Channel Impedance Value: 380 Ohm
Lead Channel Impedance Value: 399 Ohm
Lead Channel Impedance Value: 399 Ohm
Lead Channel Impedance Value: 399 Ohm
Lead Channel Impedance Value: 456 Ohm
Lead Channel Impedance Value: 684 Ohm
Lead Channel Impedance Value: 684 Ohm
Lead Channel Impedance Value: 703 Ohm
Lead Channel Impedance Value: 703 Ohm
Lead Channel Impedance Value: 703 Ohm
Lead Channel Pacing Threshold Amplitude: 0.375 V
Lead Channel Pacing Threshold Amplitude: 0.5 V
Lead Channel Pacing Threshold Amplitude: 0.625 V
Lead Channel Pacing Threshold Pulse Width: 0.4 ms
Lead Channel Pacing Threshold Pulse Width: 0.4 ms
Lead Channel Pacing Threshold Pulse Width: 0.8 ms
Lead Channel Sensing Intrinsic Amplitude: 1.6 mV
Lead Channel Sensing Intrinsic Amplitude: 5.4 mV
Lead Channel Setting Pacing Amplitude: 1.25 V
Lead Channel Setting Pacing Amplitude: 1.5 V
Lead Channel Setting Pacing Amplitude: 2 V
Lead Channel Setting Pacing Pulse Width: 0.4 ms
Lead Channel Setting Pacing Pulse Width: 0.8 ms
Lead Channel Setting Sensing Sensitivity: 0.3 mV
Zone Setting Status: 755011
Zone Setting Status: 755011
Zone Setting Status: 755011

## 2023-11-16 NOTE — Progress Notes (Signed)
 Remote ICD Transmission

## 2023-11-19 ENCOUNTER — Ambulatory Visit: Payer: Self-pay | Admitting: Cardiology

## 2023-11-20 ENCOUNTER — Other Ambulatory Visit (HOSPITAL_COMMUNITY): Payer: Self-pay

## 2023-11-20 ENCOUNTER — Ambulatory Visit: Admitting: Psychiatry

## 2023-11-20 ENCOUNTER — Encounter: Payer: Self-pay | Admitting: Psychiatry

## 2023-11-20 ENCOUNTER — Other Ambulatory Visit: Payer: Self-pay

## 2023-11-20 DIAGNOSIS — F900 Attention-deficit hyperactivity disorder, predominantly inattentive type: Secondary | ICD-10-CM | POA: Diagnosis not present

## 2023-11-20 DIAGNOSIS — F325 Major depressive disorder, single episode, in full remission: Secondary | ICD-10-CM

## 2023-11-20 MED ORDER — AMPHETAMINE-DEXTROAMPHETAMINE 10 MG PO TABS
5.0000 mg | ORAL_TABLET | Freq: Every day | ORAL | 0 refills | Status: AC
  Filled 2023-11-20: qty 90, 90d supply, fill #0

## 2023-11-20 MED ORDER — LISDEXAMFETAMINE DIMESYLATE 70 MG PO CAPS
70.0000 mg | ORAL_CAPSULE | Freq: Every day | ORAL | 0 refills | Status: AC
  Filled 2023-11-20 – 2023-12-08 (×2): qty 90, 90d supply, fill #0

## 2023-11-20 NOTE — Progress Notes (Signed)
 [name] [id number] 07/13/1970 53 y.o.  Subjective:   Patient ID:  [name] is a 53 y.o. (DOB 06/05/1970) male.  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  +visit 02/18/19:.  No meds were changed.Continue Vyvanse  70 mg AM and occ Adderall 10 mg each pm.  08/21/19 appt with the following noted: Stressful but overall good since here.  No card problems since here.   Still doing woodworking.  Redecorated his living room at home.    Still on Vyvanse  70 and prn Adderall.  Some split double shifts. No SE.  Patient is under the care of cardiology.  His diagnoses include nonischemic cardiomyopathy, systolic congestive heart failure which is chronic, history of ventricular tachycardia, history of SVT.  He has a implantable defibrillator and pacemaker.  Pacemaker helped.   Cardiology had no concerns regarding stimulant use.  Career change decision at the beginning of the year and then Covid hit.  Had to return to [employer].  Has built and made things during Covid and other projects.  Enjoys making things.  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  Been productive.  Daughter has stayed with him since Covid a lot.  53yo.   Satisfied with ADD treatment with Vyvanse  and occ immediate use Adderall 10 mg on occasion.  Needs this esp on long days.  Had trouble with the holidays with schedule and what to do.  This has prompted him to consider another vocation.  Comfortable rut for 6 years.  Needs to change.  Med wise the schedule has been more erratic requiring short-acting stimulant. Plan: no med changes.  02/19/2020 appointment with the following noted: Tumultuous several months with good and bad.   Health overall OK except AFIB about a month ago.  Previous was a year ago.  Never needed treatment.   Thinks maybe stress triggered it. Has GF.  Supportive. On the whole anxiety is manageable but a lot of health stress issues this last year.  Ortho injuries Also has gout onset and changed diet. Changing jobs..   Plan continue Vyvanse   11/16/2020 appointment with the following noted: GF is NP.  Got pt assistance recently and able to get Vyvanse  .  Adderall is uneven and more intrusive. Cardiac ouput is better. Plan: Continue Vyvanse  70 mg every morning and Adderall 10 mg in the afternoon on long work days  05/17/2021 appointment with the following noted: Adderall only on long days or split shifts. Tolerating meds well. Occ bouts of insomnia.  Sometimes trouble falling asleep   Patient reports stable mood and denies depressed or irritable moods.  Patient denies any recent difficulty with anxiety except as usual.  Patient denies difficulty with sleep initiation or maintenance and is better. Denies appetite disturbance.  Patient reports that energy and motivation have been good.  Patient denies any difficulty with concentration.  Patient denies any suicidal ideation. Plan: Continue Vyvanse  70 mg AM and occ Adderall 10 mg each pm. He's has patient assitance for Vyvanse .  11/22/21 appt noted: Disc shortage of Vyvanse .  Just got generic of 70 mg daily.  Didn't seem as effective. GF has heard generic seemed less effective.   He doesn't feel it lasts as long as brand did.  Taking Adderall more in the afternoon with it.   Pt  assistance ran out of it. Had been otherwise satisfied with it.   No sign problems with depression or anxiety. Sleep affected by work and never has been great with lifelong problems with it.  Often some trouble going to sleep.  Can be pattern problems. Health stable with heart etc. Plan: Continue Vyvanse  70 mg AM and occ Adderall 10 mg each pm.  05/23/22 appt noted: Generic Vyvanse  does some benefit but not as good as the brand.  Longevity is not the same.   Using Adderalll if  works long shifts.  A couple of times per week. Overall still satisfied.  With stimulants. Overall still doing well with mood and anxiety. Insomnia comes and goes since childhood but used to it.  No changes desired. No SE Health stable.  Heart is better with type of pacemaker.    11/21/22 appt noted:  Continues Vyvanse  70 mg AM and occ Adderall 10 mg each pm. No problems with getting Vyvanse .  Still good repsonse overall but generic not as long acting as brand.   Big career change.  Quit [employer].   GF.   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  Adjusting to day schedule with sleep. Always been a night owl.    05/22/23 appt noted: Continues Vyvanse  70 mg AM and occ Adderall 10 mg each pm. Doing well.  No concerns with meds. No SE.   Still good benefit. Salivary gland infection.  Disc this No new health px.   Overall sleep ok once asleep.  Brain will race some when lays down since a child. Plan: no changes  11/20/23 appt noted: Med: Vyvanse  70 mg AM and occ Adderall 10 mg each pm long days Busy but good.  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 Consistent with med. No SE problems.   Past Psychiatric Medication Trials: Vyvanse  70, Concerta, Adderall, Adderall XR, Daytrana,  Wellbutrin 300,  clonazepam Patient in this practice since 2004 treated for ADD  Review of Systems:  Review of Systems  HENT:  Positive for facial swelling.   Cardiovascular:  Negative for chest pain and palpitations.  Neurological:  Negative for dizziness and tremors.  Psychiatric/Behavioral:  Positive for sleep disturbance. Negative for agitation, behavioral problems, confusion, decreased concentration, dysphoric mood, hallucinations, self-injury and suicidal ideas. The patient is not nervous/anxious and is not hyperactive.     Medications: I have reviewed the patient's current medications.  Current Outpatient Medications   Medication Sig Dispense Refill   albuterol  (VENTOLIN  HFA) 108 (90 Base) MCG/ACT inhaler Inhale 2 puffs into the lungs 4 (four) times daily as needed for wheezing. 6.7 g 5   allopurinol  (ZYLOPRIM ) 100 MG tablet Take 1 tablet (100 mg total) by mouth daily. 90 tablet 1   carvedilol  (COREG ) 12.5 MG tablet Take 1 tablet (12.5 mg total) by mouth 2 (two) times daily. 180 tablet 3   Glucosamine-Chondroit-Vit C-Mn (GLUCOSAMINE 1500 COMPLEX PO) Take 1 tablet by mouth daily.     Multiple Vitamin (MULTIVITAMIN WITH MINERALS) TABS tablet Take 1 tablet by mouth daily.     sacubitril -valsartan  (ENTRESTO ) 24-26 MG Take 1 tablet by mouth 2 (two) times daily. 180 tablet 3   sildenafil  (VIAGRA ) 50 MG tablet Take 1 tablet by mouth daily as needed for erectile dysfunction. 10 tablet 3   sotalol  (BETAPACE ) 80 MG tablet Take 1 tablet (80 mg total) by mouth every 12 (twelve) hours. 180 tablet 3  Turmeric 500 MG CAPS Take 1,500 mg by mouth daily.      UNABLE TO FIND Take 200 mg by mouth daily. Lions mane mushroom extract     amphetamine -dextroamphetamine  (ADDERALL) 10 MG tablet Take 1/2 - 1 tablet (5 - 10 mg total) by mouth daily. 90 tablet 0   lisdexamfetamine (VYVANSE ) 70 MG capsule Take 1 capsule (70 mg total) by mouth daily. 90 capsule 0   No current facility-administered medications for this visit.    Medication Side Effects: None  Allergies: No Known Allergies  Past Medical History:  Diagnosis Date   ADHD (attention deficit hyperactivity disorder)    AICD (automatic cardioverter/defibrillator) present 06/30/2014   BiV ICD Insertion CRT-D   Arthritis    Asthma    ETOH abuse    GERD (gastroesophageal reflux disease)    Heart murmur    Nonischemic cardiomyopathy (HCC)    PAF (paroxysmal atrial fibrillation) (HCC)    Pneumonia ~ 01/2014   Syncope    episode occured Feb 2011, ICD placed   Systolic congestive heart failure (HCC)    With ejection fraction of 25%, normal coronaries on Cath 02/2010    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[facility]) 01/04/1996   right   Ventricular tachycardia (HCC)     Family History  Problem Relation Age of Onset   Heart disease Mother        mother and sister with valve problems   Pancreatic cancer Maternal Grandfather    ADD / ADHD Sister    Cardiomyopathy Neg Hx     Social History   Socioeconomic History   Marital status: Media Planner    Spouse name: Not on file   Number of children: Not on file   Years of education: Not on file   Highest education level: Bachelor's degree (e.g., BA, AB, BS)  Occupational History   Occupation: Part time Art Gallery Manager  Tobacco Use   Smoking status: Never    Passive exposure: Yes   Smokeless tobacco: Never   Tobacco comments:    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  Vaping Use   Vaping status: Never Used  Substance and Sexual Activity   Alcohol use: Yes    Alcohol/week: 15.0 standard drinks of alcohol    Types: 15 Glasses of wine per week    Comment: 06/30/2014 2-6 beer2 or glasses of wine/day; probably 2/3 of these beers   Drug use: No   Sexual activity: Yes    Birth control/protection: None  Other Topics Concern   Not on file  Social History Narrative   Not on file   Social Drivers of Health   Financial Resource Strain: Low Risk  (09/18/2023)   Overall Financial Resource Strain (CARDIA)    Difficulty of Paying Living Expenses: Not hard at all  Food Insecurity: No Food Insecurity (09/18/2023)   Hunger Vital Sign    Worried About Running Out of Food in the Last Year: Never true    Ran Out of Food in the Last Year: Never true  Transportation Needs: No Transportation Needs (09/18/2023)   PRAPARE - Administrator, Civil Service (Medical): No    Lack of Transportation (Non-Medical): No  Physical Activity: Sufficiently Active (09/18/2023)   Exercise Vital Sign    Days of Exercise per Week: 4 days    Minutes of Exercise per Session: 40 min  Stress: Stress Concern  Present (09/18/2023)   [employer] of Occupational Health - Occupational Stress  Questionnaire    Feeling of Stress: To some extent  Social Connections: Moderately Isolated (09/18/2023)   Social Connection and Isolation Panel    Frequency of Communication with Friends and Family: More than three times a week    Frequency of Social Gatherings with Friends and Family: Once a week    Attends Religious Services: Never    Database Administrator or Organizations: No    Attends Engineer, Structural: Not on file    Marital Status: Married  Catering Manager Violence: Not on file    Past Medical History, Surgical history, Social history, and Family history were reviewed and updated as appropriate.   Please see review of systems for further details on the patient's review from today.   Objective:   Physical Exam:  There were no vitals taken for this visit.  Physical Exam Constitutional:      General: He is not in acute distress.    Appearance: He is well-developed.  Musculoskeletal:        General: No deformity.  Neurological:     Mental Status: He is alert and oriented to person, place, and time.     Motor: No tremor.     Coordination: Coordination normal.     Gait: Gait normal.  Psychiatric:        Attention and Perception: Attention normal. He is attentive. He does not perceive auditory hallucinations.        Mood and Affect: Mood normal. Mood is not anxious or depressed. Affect is not labile.        Speech: Speech normal. Speech is not rapid and pressured.        Behavior: Behavior normal.        Thought Content: Thought content normal. Thought content is not delusional. Thought content does not include homicidal or suicidal ideation. Thought content does not include suicidal plan.        Cognition and Memory: Cognition normal.        Judgment: Judgment normal.     Comments: Insight is good.      Lab Review:     Component Value Date/Time   NA 141 09/20/2023 1057    NA 140 06/26/2023 1510   K 4.4 09/20/2023 1057   CL 105 09/20/2023 1057   CO2 25 09/20/2023 1057   GLUCOSE 97 09/20/2023 1057   BUN 18 09/20/2023 1057   BUN 15 06/26/2023 1510   CREATININE 1.31 09/20/2023 1057   CREATININE 1.11 04/05/2021 1446   CALCIUM 9.3 09/20/2023 1057   PROT 6.7 09/20/2023 1057   PROT 6.5 07/25/2019 1243   ALBUMIN 4.5 09/20/2023 1057   ALBUMIN 4.4 07/25/2019 1243   AST 29 09/20/2023 1057   ALT 38 09/20/2023 1057   ALKPHOS 52 09/20/2023 1057   BILITOT 0.6 09/20/2023 1057   BILITOT 0.4 07/25/2019 1243   GFRNONAA 97 01/29/2020 1302   GFRAA 112 01/29/2020 1302       Component Value Date/Time   WBC 5.4 09/20/2023 1057   RBC 4.09 (L) 09/20/2023 1057   HGB 13.6 09/20/2023 1057   HGB 13.7 06/26/2023 1510   HCT 40.7 09/20/2023 1057   HCT 41.7 06/26/2023 1510   PLT 170.0 09/20/2023 1057   PLT 181 06/26/2023 1510   MCV 99.5 09/20/2023 1057   MCV 103 (H) 06/26/2023 1510   MCH 33.9 (H) 06/26/2023 1510   MCH 33.8 (H) 04/05/2021 1446   MCHC 33.4 09/20/2023 1057   RDW 13.3 09/20/2023 1057   RDW  12.4 06/26/2023 1510   LYMPHSABS 1.3 09/20/2023 1057   MONOABS 0.5 09/20/2023 1057   EOSABS 0.2 09/20/2023 1057   BASOSABS 0.0 09/20/2023 1057    No results found for: POCLITH, LITHIUM   No results found for: PHENYTOIN, PHENOBARB, VALPROATE, CBMZ   .res Assessment: Plan:    [name] was seen today for follow-up and add.  Diagnoses and all orders for this visit:  Attention deficit hyperactivity disorder (ADHD), predominantly inattentive type -     lisdexamfetamine (VYVANSE ) 70 MG capsule; Take 1 capsule (70 mg total) by mouth daily. -     amphetamine -dextroamphetamine  (ADDERALL) 10 MG tablet; Take 1/2 - 1 tablet (5 - 10 mg total) by mouth daily.  Major depressive disorder with single episode, in full remission   Anxiety manageable without meds.  Satisfied with Vyvanse  and prn Adderall. With ADD managed.    Continue Vyvanse  70 mg AM and occ  Adderall 10 mg each pm. No med changes  Disc generic vs brand effects .  He prefers brand and cost no longer a px withit.  No change indicated.  But disc the alternative types of stimulants and durations and SE. Discussed potential benefits, risks, and side effects of stimulants with patient to include increased heart rate, palpitations, insomnia, increased anxiety, increased irritability, or decreased appetite.  Instructed patient to contact office if experiencing any significant tolerability issues.  He's paying attention to the cardiac risk.  Sleep is ok.  LT tendency to initial  insomnia all his life. Sleep hygiene and ideal temps.  Good sleep tends to cycle.  FU 6 mos  Lorene Macintosh, MD, DFAPA   Please see After Visit Summary for patient specific instructions.  Future Appointments  Date Time Provider Department Center  02/12/2024 10:45 AM [facility] DEVICE REMOTES [facility]  05/13/2024 10:45 AM [facility] DEVICE REMOTES [facility]  08/12/2024 10:45 AM [facility] DEVICE REMOTES [facility]  11/11/2024 10:45 AM [facility] DEVICE REMOTES [facility]  02/10/2025 10:45 AM [facility] DEVICE REMOT[facility]  05/12/2025 10:45 AM [facility] DEVICE REMOTES [facility]     No orders of the defined types were placed in this encounter.     -------------------------------

## 2023-11-26 ENCOUNTER — Encounter: Payer: Self-pay | Admitting: Cardiology

## 2023-11-26 DIAGNOSIS — I5022 Chronic systolic (congestive) heart failure: Secondary | ICD-10-CM

## 2023-12-01 ENCOUNTER — Other Ambulatory Visit (HOSPITAL_COMMUNITY): Payer: Self-pay

## 2023-12-04 ENCOUNTER — Other Ambulatory Visit (HOSPITAL_COMMUNITY): Payer: Self-pay

## 2023-12-05 ENCOUNTER — Ambulatory Visit (HOSPITAL_COMMUNITY)
Admission: RE | Admit: 2023-12-05 | Discharge: 2023-12-05 | Disposition: A | Source: Ambulatory Visit | Attending: Pulmonary Disease | Admitting: Pulmonary Disease

## 2023-12-05 DIAGNOSIS — I5022 Chronic systolic (congestive) heart failure: Secondary | ICD-10-CM | POA: Diagnosis not present

## 2023-12-05 DIAGNOSIS — I38 Endocarditis, valve unspecified: Secondary | ICD-10-CM | POA: Insufficient documentation

## 2023-12-05 LAB — ECHOCARDIOGRAM COMPLETE
AR max vel: 3.8 cm2
AV Area VTI: 3.68 cm2
AV Area mean vel: 3.73 cm2
AV Mean grad: 2 mmHg
AV Peak grad: 4.7 mmHg
Ao pk vel: 1.08 m/s
Area-P 1/2: 3.42 cm2
MV M vel: 2.09 m/s
MV Peak grad: 17.5 mmHg
Radius: 0.3 cm
S' Lateral: 5.4 cm

## 2023-12-06 ENCOUNTER — Other Ambulatory Visit (HOSPITAL_BASED_OUTPATIENT_CLINIC_OR_DEPARTMENT_OTHER): Payer: Self-pay

## 2023-12-06 ENCOUNTER — Other Ambulatory Visit (HOSPITAL_COMMUNITY): Payer: Self-pay

## 2023-12-07 ENCOUNTER — Other Ambulatory Visit (HOSPITAL_COMMUNITY): Payer: Self-pay

## 2023-12-07 ENCOUNTER — Other Ambulatory Visit: Payer: Self-pay

## 2023-12-07 ENCOUNTER — Encounter: Payer: Self-pay | Admitting: Pharmacist

## 2023-12-08 ENCOUNTER — Other Ambulatory Visit (HOSPITAL_COMMUNITY): Payer: Self-pay

## 2023-12-08 ENCOUNTER — Ambulatory Visit: Payer: Self-pay | Admitting: Pulmonary Disease

## 2023-12-09 ENCOUNTER — Other Ambulatory Visit (HOSPITAL_COMMUNITY): Payer: Self-pay

## 2023-12-11 ENCOUNTER — Other Ambulatory Visit (HOSPITAL_COMMUNITY): Payer: Self-pay

## 2023-12-27 NOTE — Progress Notes (Unsigned)
 " Electrophysiology Office Note:   Date:  12/29/2023  ID:  DIXIE JAFRI, DOB 05/23/1970, MRN [id number]  Primary Cardiologist: None Primary Heart Failure: None Electrophysiologist: Fonda Kitty, MD       History of Present Illness:   [name] is a 53 y.o. male with h/o NICM due to chemotherapy s/p CRT-D, VT, and AF seen today for routine electrophysiology followup.   Since last being seen in our clinic the patient reports doing well overall. His wife accompanies him to the visit - she is an NP at the [employer].  They have questions regarding his ECHO findings and next steps. He reports he feels well. Denies exertional shortness of breath or chest pain.   He denies chest pain, palpitations, dyspnea, PND, orthopnea, nausea, vomiting, dizziness, syncope, edema, weight gain, or early satiety.   Review of systems complete and found to be negative unless listed in HPI.   EP Information / Studies Reviewed:    EKG is not ordered today. EKG from 10/24/23 reviewed which showed SR with BiV pacing 69 bpm        ICD Interrogation-  reviewed in detail today,  limited device check for optimization review.  See below for details.  Device History: Medtronic BiV ICD implanted 06/30/14 for NICM Prior single chamber ICD implanted 02/10/2010.  Mixed lead system with Gore Lead.  Generator Change > 07/10/23 History of appropriate therapy: Yes, 08/2013 ATP failed to terminate, 24j shock failed, 35j shock was successful  History of AAD therapy: Sotalol  for VT      Arrhythmia / AAD / Pertinent EP Studies VT cMRI 02/2010 > LVEF 27% ECHO 09/2016 > LVEF 45-50%     Risk Assessment/Calculations:    CHA2DS2-VASc Score = 1   This indicates a 0.6% annual risk of stroke. The patient's score is based upon: CHF History: 1 HTN History: 0 Diabetes History: 0 Stroke History: 0 Vascular Disease History: 0 Age Score: 0 Gender Score: 0              Physical Exam:   VS:   BP 110/62 (BP Location: Left Arm, Patient Position: Sitting, Cuff Size: Large)   Pulse 67   Ht 6' 5 (1.956 m)   Wt 226 lb (102.5 kg)   SpO2 99%   BMI 26.80 kg/m    Wt Readings from Last 3 Encounters:  12/29/23 226 lb (102.5 kg)  10/24/23 226 lb (102.5 kg)  09/20/23 225 lb (102.1 kg)     GEN: Well nourished, well developed in no acute distress NECK: No JVD; No carotid bruits CARDIAC: Regular rate and rhythm, no murmurs, rubs, gallops RESPIRATORY:  Clear to auscultation without rales, wheezing or rhonchi  ABDOMEN: Soft, non-tender, non-distended EXTREMITIES:  No edema; No deformity   ASSESSMENT AND PLAN:    Chronic Systolic Dysfunction s/p Medtronic CRT-D VT with Hx of ICD Shock  High Risk Medication Monitoring: Sotalol    Pt has GORE LEAD  -euvolemic today -Stable on an appropriate medical regimen -Normal ICD function -continue Sotalol  80 mg BID  -90% BiV pacing  -see full device check from 10/2023 in [facility]  -pt's EGM's reviewed, he continues to have ~ 90% BiV pacing.  After second review, this was not due to true V-sensed events (like PVC's) but rather related to how his atrial sensitivity was set (0.44mV) due to known far field / atrial undersensing.  His atrial blanking is appropriate which should cover FF.  Atrial sensing reduced to 0.38mV  as device was not seeing atrial events and then pt would have VS event (as pacemaker did not see to time for BiV pacing), thus he has about 8% V-sensed events on device accounting for his reduction of BiV pacing  -will pull remote report in 2 weeks to review BiV pacing % -pt was previously referred back to advanced [facility] Clinic but does not have an appt, will send additional referral for appt to review medication for optimization, ? Add Farxiga.   Paroxysmal Atrial Fibrillation  -not on OAC with low burden    Disposition:   Follow up with Dr. Kennyth in 12 months as previously planned    Signed, Daphne Barrack, NP-C, AGACNP-BC [facility] - Electrophysiology  12/29/2023, 1:30 PM  "

## 2023-12-29 ENCOUNTER — Encounter: Payer: Self-pay | Admitting: Pulmonary Disease

## 2023-12-29 ENCOUNTER — Ambulatory Visit: Attending: Pulmonary Disease | Admitting: Pulmonary Disease

## 2023-12-29 VITALS — BP 110/62 | HR 67 | Ht 77.0 in | Wt 226.0 lb

## 2023-12-29 DIAGNOSIS — I5022 Chronic systolic (congestive) heart failure: Secondary | ICD-10-CM

## 2023-12-29 DIAGNOSIS — I38 Endocarditis, valve unspecified: Secondary | ICD-10-CM

## 2023-12-29 DIAGNOSIS — Z95810 Presence of automatic (implantable) cardiac defibrillator: Secondary | ICD-10-CM | POA: Diagnosis not present

## 2023-12-29 DIAGNOSIS — I472 Ventricular tachycardia, unspecified: Secondary | ICD-10-CM | POA: Diagnosis not present

## 2023-12-29 DIAGNOSIS — I428 Other cardiomyopathies: Secondary | ICD-10-CM

## 2023-12-29 NOTE — Patient Instructions (Signed)
 Medication Instructions:  NO CHANGES *If you need a refill on your cardiac medications before your next appointment, please call your pharmacy*  Lab Work: NO LABS If you have labs (blood work) drawn today and your tests are completely normal, you will receive your results only by: MyChart Message (if you have MyChart) OR A paper copy in the mail If you have any lab test that is abnormal or we need to change your treatment, we will call you to review the results.  Testing/Procedures: NO TESTING  Follow-Up: At [facility], you and your health needs are our priority.  As part of our continuing mission to provide you with exceptional heart care, our providers are all part of one team.  This team includes your primary Cardiologist (physician) and Advanced Practice Providers or APPs (Physician Assistants and Nurse Practitioners) who all work together to provide you with the care you need, when you need it.  Your next appointment:   1 year(s)  Provider:   Daphne Barrack or Fonda Kitty, MD  Other Instructions You have been referred to HEART FAILURE CLINIC

## 2024-01-09 ENCOUNTER — Encounter

## 2024-01-12 ENCOUNTER — Ambulatory Visit: Attending: Family Medicine

## 2024-01-12 ENCOUNTER — Telehealth: Payer: Self-pay

## 2024-01-12 NOTE — Telephone Encounter (Signed)
 Unscheduled remote transmission: Scheduled non-billable, reassess BiV pacing, 98.3% Normal device function.  Improved post programming changes.  _____________________________________________________________________________  Patient previously seen Daphne Barrack, NP on 12/28/2024. Bi-V pacing percentage previously 90% during office visit. Programming changes made during office visit and 2 week remote transmission scheduled at that time.   Remote transmission received and reviewed. Bi-V pacing percentage now 98.3% showing improvement. Will route to provider for awareness. Will continue to monitor and update accordingly. See full attachment in PA for details.

## 2024-02-12 ENCOUNTER — Encounter

## 2024-04-09 ENCOUNTER — Encounter

## 2024-05-13 ENCOUNTER — Encounter

## 2024-07-09 ENCOUNTER — Encounter

## 2024-08-12 ENCOUNTER — Encounter

## 2024-08-19 ENCOUNTER — Ambulatory Visit: Admitting: Psychiatry

## 2024-10-08 ENCOUNTER — Encounter

## 2024-11-11 ENCOUNTER — Encounter

## 2025-01-07 ENCOUNTER — Encounter

## 2025-02-10 ENCOUNTER — Encounter

## 2025-04-08 ENCOUNTER — Encounter

## 2025-05-12 ENCOUNTER — Encounter

## 2025-07-08 ENCOUNTER — Encounter
